# Patient Record
Sex: Male | Born: 1940 | Race: White | Hispanic: No | Marital: Single | Smoking: Former smoker
Health system: Southern US, Community
[De-identification: ages and names within clinical notes are randomized; demographics above are authoritative.]

## PROBLEM LIST (undated history)

## (undated) DIAGNOSIS — I10 Essential (primary) hypertension: Secondary | ICD-10-CM

## (undated) DIAGNOSIS — K469 Unspecified abdominal hernia without obstruction or gangrene: Secondary | ICD-10-CM

## (undated) DIAGNOSIS — D751 Secondary polycythemia: Principal | ICD-10-CM

## (undated) DIAGNOSIS — D45 Polycythemia vera: Secondary | ICD-10-CM

## (undated) DIAGNOSIS — R197 Diarrhea, unspecified: Secondary | ICD-10-CM

## (undated) DIAGNOSIS — R42 Dizziness and giddiness: Secondary | ICD-10-CM

## (undated) DIAGNOSIS — R413 Other amnesia: Secondary | ICD-10-CM

## (undated) DIAGNOSIS — C801 Malignant (primary) neoplasm, unspecified: Secondary | ICD-10-CM

## (undated) DIAGNOSIS — T7840XA Allergy, unspecified, initial encounter: Secondary | ICD-10-CM

## (undated) DIAGNOSIS — I498 Other specified cardiac arrhythmias: Secondary | ICD-10-CM

## (undated) DIAGNOSIS — E876 Hypokalemia: Secondary | ICD-10-CM

## (undated) DIAGNOSIS — L989 Disorder of the skin and subcutaneous tissue, unspecified: Secondary | ICD-10-CM

## (undated) DIAGNOSIS — H9190 Unspecified hearing loss, unspecified ear: Secondary | ICD-10-CM

## (undated) DIAGNOSIS — F17200 Nicotine dependence, unspecified, uncomplicated: Secondary | ICD-10-CM

## (undated) DIAGNOSIS — R1084 Generalized abdominal pain: Secondary | ICD-10-CM

## (undated) DIAGNOSIS — N289 Disorder of kidney and ureter, unspecified: Secondary | ICD-10-CM

## (undated) HISTORY — DX: Essential (primary) hypertension: I10

## (undated) HISTORY — DX: Secondary polycythemia: D75.1

## (undated) HISTORY — DX: Other disorders of iron metabolism: E83.19

## (undated) HISTORY — PX: CHOLECYSTECTOMY: SHX55

## (undated) HISTORY — DX: Polycythemia vera: D45

## (undated) HISTORY — DX: Hypokalemia: E87.6

## (undated) HISTORY — DX: Disorder of the skin and subcutaneous tissue, unspecified: L98.9

## (undated) HISTORY — PX: VENTRAL HERNIA REPAIR: SHX424

## (undated) HISTORY — DX: Diarrhea, unspecified: R19.7

## (undated) HISTORY — DX: Disorder of kidney and ureter, unspecified: N28.9

## (undated) HISTORY — DX: Unspecified abdominal hernia without obstruction or gangrene: K46.9

## (undated) HISTORY — DX: Generalized abdominal pain: R10.84

## (undated) HISTORY — DX: Other specified cardiac arrhythmias: I49.8

## (undated) HISTORY — DX: Unspecified hearing loss, unspecified ear: H91.90

## (undated) HISTORY — DX: Nicotine dependence, unspecified, uncomplicated: F17.200

## (undated) HISTORY — DX: Other amnesia: R41.3

## (undated) HISTORY — DX: Dizziness and giddiness: R42

## (undated) HISTORY — DX: Allergy, unspecified, initial encounter: T78.40XA

## (undated) HISTORY — PX: OTHER SURGICAL HISTORY: SHX169

---

## 2007-01-16 ENCOUNTER — Encounter: Admission: RE | Admit: 2007-01-16 | Discharge: 2007-01-16 | Payer: Self-pay | Admitting: General Surgery

## 2007-04-18 ENCOUNTER — Ambulatory Visit: Payer: Self-pay | Admitting: Internal Medicine

## 2007-05-03 ENCOUNTER — Ambulatory Visit: Payer: Self-pay | Admitting: Internal Medicine

## 2007-07-05 ENCOUNTER — Encounter: Payer: Self-pay | Admitting: Internal Medicine

## 2008-12-26 ENCOUNTER — Ambulatory Visit: Payer: Self-pay | Admitting: Oncology

## 2009-01-12 LAB — CBC WITH DIFFERENTIAL (CANCER CENTER ONLY)
BASO#: 0.1 10*3/uL (ref 0.0–0.2)
EOS%: 2.2 % (ref 0.0–7.0)
HCT: 49.2 % (ref 38.7–49.9)
LYMPH#: 1.2 10*3/uL (ref 0.9–3.3)
MCH: 31.3 pg (ref 28.0–33.4)
MCHC: 33 g/dL (ref 32.0–35.9)
MCV: 95 fL (ref 82–98)
MONO%: 5.7 % (ref 0.0–13.0)
NEUT%: 69.9 % (ref 40.0–80.0)

## 2009-01-12 LAB — CMP (CANCER CENTER ONLY)
AST: 30 U/L (ref 11–38)
Albumin: 3.3 g/dL (ref 3.3–5.5)
BUN, Bld: 9 mg/dL (ref 7–22)
CO2: 30 mEq/L (ref 18–33)
Calcium: 9.2 mg/dL (ref 8.0–10.3)
Chloride: 102 mEq/L (ref 98–108)
Creat: 0.8 mg/dl (ref 0.6–1.2)
Glucose, Bld: 102 mg/dL (ref 73–118)
Potassium: 4 mEq/L (ref 3.3–4.7)
Sodium: 141 mEq/L (ref 128–145)
Total Bilirubin: 0.6 mg/dl (ref 0.20–1.60)
Total Protein: 7.2 g/dL (ref 6.4–8.1)

## 2009-01-12 LAB — MORPHOLOGY - CHCC SATELLITE
PLT EST ~~LOC~~: ADEQUATE
Platelet Morphology: NORMAL
RBC Comments: NORMAL

## 2009-01-13 LAB — IRON AND TIBC
TIBC: 335 ug/dL (ref 215–435)
UIBC: 270 ug/dL

## 2009-01-16 ENCOUNTER — Ambulatory Visit (HOSPITAL_COMMUNITY): Admission: RE | Admit: 2009-01-16 | Discharge: 2009-01-16 | Payer: Self-pay | Admitting: Oncology

## 2009-02-03 ENCOUNTER — Ambulatory Visit: Payer: Self-pay | Admitting: Oncology

## 2009-02-05 LAB — CBC WITH DIFFERENTIAL (CANCER CENTER ONLY)
BASO#: 0.1 10*3/uL (ref 0.0–0.2)
BASO%: 1 % (ref 0.0–2.0)
EOS%: 2 % (ref 0.0–7.0)
LYMPH#: 1.1 10*3/uL (ref 0.9–3.3)
LYMPH%: 16.7 % (ref 14.0–48.0)
MCHC: 33.6 g/dL (ref 32.0–35.9)
MONO%: 6.9 % (ref 0.0–13.0)
NEUT#: 4.8 10*3/uL (ref 1.5–6.5)
NEUT%: 73.4 % (ref 40.0–80.0)
WBC: 6.5 10*3/uL (ref 4.0–10.0)

## 2009-08-10 ENCOUNTER — Ambulatory Visit: Payer: Self-pay | Admitting: Oncology

## 2009-08-19 LAB — CBC WITH DIFFERENTIAL (CANCER CENTER ONLY)
BASO#: 0.2 10*3/uL (ref 0.0–0.2)
HCT: 52.3 % — ABNORMAL HIGH (ref 38.7–49.9)
LYMPH#: 1.4 10*3/uL (ref 0.9–3.3)
LYMPH%: 21.9 % (ref 14.0–48.0)
MCH: 31.1 pg (ref 28.0–33.4)
MCHC: 33.1 g/dL (ref 32.0–35.9)
MCV: 94 fL (ref 82–98)
MONO#: 0.5 10*3/uL (ref 0.1–0.9)
NEUT#: 4.3 10*3/uL (ref 1.5–6.5)
RDW: 11.4 % (ref 10.5–14.6)
WBC: 6.6 10*3/uL (ref 4.0–10.0)

## 2009-09-22 ENCOUNTER — Ambulatory Visit: Payer: Self-pay | Admitting: Oncology

## 2009-09-24 LAB — CBC WITH DIFFERENTIAL/PLATELET
BASO%: 0.6 % (ref 0.0–2.0)
LYMPH%: 20.9 % (ref 14.0–49.0)
NEUT#: 4.7 10*3/uL (ref 1.5–6.5)
Platelets: 236 10*3/uL (ref 140–400)
RBC: 5.09 10*6/uL (ref 4.20–5.82)
WBC: 6.9 10*3/uL (ref 4.0–10.3)
lymph#: 1.5 10*3/uL (ref 0.9–3.3)

## 2009-10-23 ENCOUNTER — Ambulatory Visit: Payer: Self-pay | Admitting: Oncology

## 2009-10-27 LAB — CBC WITH DIFFERENTIAL (CANCER CENTER ONLY)
BASO%: 0.8 % (ref 0.0–2.0)
EOS%: 3.7 % (ref 0.0–7.0)
MCHC: 34.3 g/dL (ref 32.0–35.9)
MONO#: 0.4 10*3/uL (ref 0.1–0.9)
NEUT#: 3.7 10*3/uL (ref 1.5–6.5)
NEUT%: 66 % (ref 40.0–80.0)
Platelets: 305 10*3/uL (ref 145–400)
RBC: 5.06 10*6/uL (ref 4.20–5.70)
RDW: 12 % (ref 10.5–14.6)

## 2009-11-03 HISTORY — PX: US ECHOCARDIOGRAPHY: HXRAD669

## 2009-11-03 HISTORY — PX: NM MYOCAR PERF WALL MOTION: HXRAD629

## 2009-11-27 ENCOUNTER — Ambulatory Visit: Payer: Self-pay | Admitting: Oncology

## 2009-12-03 LAB — CBC WITH DIFFERENTIAL (CANCER CENTER ONLY)
BASO%: 0.6 % (ref 0.0–2.0)
HCT: 40.8 % (ref 38.7–49.9)
HGB: 13.8 g/dL (ref 13.0–17.1)
LYMPH#: 1.4 10*3/uL (ref 0.9–3.3)
MCHC: 33.8 g/dL (ref 32.0–35.9)
MONO#: 0.5 10*3/uL (ref 0.1–0.9)
NEUT#: 4.3 10*3/uL (ref 1.5–6.5)
Platelets: 319 10*3/uL (ref 145–400)
RBC: 4.57 10*6/uL (ref 4.20–5.70)

## 2009-12-03 LAB — IRON AND TIBC
Iron: 44 ug/dL (ref 42–165)
UIBC: 349 ug/dL

## 2010-06-03 ENCOUNTER — Encounter (HOSPITAL_BASED_OUTPATIENT_CLINIC_OR_DEPARTMENT_OTHER): Payer: Medicare Other | Admitting: Oncology

## 2010-06-03 ENCOUNTER — Other Ambulatory Visit: Payer: Self-pay | Admitting: Oncology

## 2010-06-03 DIAGNOSIS — D45 Polycythemia vera: Secondary | ICD-10-CM

## 2010-06-03 LAB — CBC WITH DIFFERENTIAL/PLATELET
Eosinophils Absolute: 0.1 10*3/uL (ref 0.0–0.5)
HCT: 41 % (ref 38.4–49.9)
HGB: 13.6 g/dL (ref 13.0–17.1)
MCH: 28.9 pg (ref 27.2–33.4)
MCV: 87.3 fL (ref 79.3–98.0)
Platelets: 286 10*3/uL (ref 140–400)
RBC: 4.7 10*6/uL (ref 4.20–5.82)
lymph#: 1.1 10*3/uL (ref 0.9–3.3)
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
## 2011-01-05 ENCOUNTER — Encounter: Payer: Self-pay | Admitting: Oncology

## 2011-01-05 ENCOUNTER — Other Ambulatory Visit: Payer: Self-pay | Admitting: Oncology

## 2011-01-05 DIAGNOSIS — D751 Secondary polycythemia: Secondary | ICD-10-CM

## 2011-01-05 HISTORY — DX: Secondary polycythemia: D75.1

## 2011-01-05 HISTORY — DX: Other disorders of iron metabolism: E83.19

## 2011-01-14 ENCOUNTER — Other Ambulatory Visit (HOSPITAL_BASED_OUTPATIENT_CLINIC_OR_DEPARTMENT_OTHER): Payer: Medicare Other | Admitting: Lab

## 2011-01-14 ENCOUNTER — Telehealth: Payer: Self-pay | Admitting: *Deleted

## 2011-01-14 ENCOUNTER — Ambulatory Visit (HOSPITAL_BASED_OUTPATIENT_CLINIC_OR_DEPARTMENT_OTHER): Payer: Medicare Other | Admitting: Oncology

## 2011-01-14 ENCOUNTER — Other Ambulatory Visit: Payer: Self-pay | Admitting: Oncology

## 2011-01-14 VITALS — BP 180/87 | HR 54 | Temp 97.9°F | Ht 67.7 in | Wt 211.8 lb

## 2011-01-14 DIAGNOSIS — F17200 Nicotine dependence, unspecified, uncomplicated: Secondary | ICD-10-CM

## 2011-01-14 DIAGNOSIS — D751 Secondary polycythemia: Secondary | ICD-10-CM

## 2011-01-14 DIAGNOSIS — D45 Polycythemia vera: Secondary | ICD-10-CM

## 2011-01-14 LAB — IRON AND TIBC
Iron: 94 ug/dL (ref 42–165)
UIBC: 312 ug/dL (ref 125–400)

## 2011-01-14 LAB — CBC WITH DIFFERENTIAL/PLATELET
BASO%: 0.3 % (ref 0.0–2.0)
Basophils Absolute: 0 10*3/uL (ref 0.0–0.1)
EOS%: 2 % (ref 0.0–7.0)
HGB: 14.8 g/dL (ref 13.0–17.1)
LYMPH%: 20.1 % (ref 14.0–49.0)
MCV: 89.5 fL (ref 79.3–98.0)
MONO%: 8.4 % (ref 0.0–14.0)
NEUT%: 69.2 % (ref 39.0–75.0)
Platelets: 262 10*3/uL (ref 140–400)

## 2011-01-14 LAB — FERRITIN: Ferritin: 13 ng/mL — ABNORMAL LOW (ref 22–322)

## 2011-01-14 NOTE — Progress Notes (Signed)
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  CURRENT THERAPY:  Phlebotomies as needed.  However, he has not had one in quite some time.  INTERVAL HISTORY:  The patient is seen in followup today.  Clinically, he seems to be doing well.  He has no fevers, chills, night sweats, headaches, shortness of breath, chest pains, or palpitations.  He has no myalgias or arthralgias.  Remainder of the 10-point review of systems is negative.  ALLERGIES:  No known drug allergies.  CURRENT MEDICATIONS:  Reviewed and updated.  PHYSICAL EXAMINATION:  General:  The patient is awake, alert, in no acute distress, appears well.  Vital Signs:  Temperature is 97.9, pulse 54, respirations 20, blood pressure 180/87, and weight is 211 pounds. HEENT Exam:  EOMI, PERRLA.  Sclerae are anicteric, no conjunctival pallor.  Oral mucosa is moist.  Neck:  Supple.  Lymphatic:  No palpable cervical, supraclavicular, or axillary adenopathy.  Lungs:  Clear bilaterally but distant breath sounds.  Cardiovascular:  Regular rate and rhythm.  Abdomen:  Soft, nontender.  Extremities:  No edema.  Neuro: Patient is alert, oriented, otherwise nonfocal.  The patient has very poor oral dental hygiene.  SOCIAL HISTORY:  The patient continues to smoke, unfortunately.  LABORATORY DATA:  WBC 5.9, hemoglobin 14.8, hematocrit of 44.1, platelets are 262,000.  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 Overall, he seems to be doing well and is pretty stable.  He unfortunately still does continue to use tobacco especially chewing tobacco and a pipe.  He therefore maintains very poor oral dental hygiene which is concerning.  At this time, we did get iron studies on him.  I do not think that he needs a phlebotomy and we therefore will see him in a year's basis.    ______________________________ Drue Second, M.D. KK/MEDQ  D:  01/14/2011  T:  01/14/2011  Job:   [id number]

## 2011-01-14 NOTE — Telephone Encounter (Signed)
GAVE PATIENT APPOINTMENT FOR 01-2012 

## 2011-01-14 NOTE — Progress Notes (Signed)
Note dictated

## 2011-01-17 ENCOUNTER — Encounter: Payer: Self-pay | Admitting: *Deleted

## 2012-01-16 ENCOUNTER — Ambulatory Visit: Payer: Medicare Other | Admitting: Oncology

## 2012-01-16 ENCOUNTER — Other Ambulatory Visit: Payer: Medicare Other | Admitting: Lab

## 2012-02-20 ENCOUNTER — Ambulatory Visit: Payer: Medicare Other | Admitting: Oncology

## 2012-02-20 ENCOUNTER — Other Ambulatory Visit: Payer: Medicare Other | Admitting: Lab

## 2012-04-04 ENCOUNTER — Other Ambulatory Visit: Payer: Self-pay | Admitting: Emergency Medicine

## 2012-04-04 ENCOUNTER — Other Ambulatory Visit: Payer: Medicare Other | Admitting: Lab

## 2012-04-04 ENCOUNTER — Ambulatory Visit: Payer: Medicare Other | Admitting: Oncology

## 2012-04-04 DIAGNOSIS — D751 Secondary polycythemia: Secondary | ICD-10-CM

## 2012-04-13 ENCOUNTER — Telehealth: Payer: Self-pay | Admitting: Oncology

## 2012-04-13 NOTE — Telephone Encounter (Signed)
opt called to r/s appt.Marland KitchenMarland KitchenMarland KitchenDone

## 2012-05-09 ENCOUNTER — Encounter: Payer: Self-pay | Admitting: Internal Medicine

## 2012-05-30 ENCOUNTER — Encounter: Payer: Self-pay | Admitting: Internal Medicine

## 2012-06-06 ENCOUNTER — Telehealth: Payer: Self-pay | Admitting: *Deleted

## 2012-06-06 NOTE — Telephone Encounter (Signed)
Pt called in to discuss an appt

## 2012-06-06 NOTE — Telephone Encounter (Signed)
sw pt gv appt d/t for 09/21/12.

## 2012-06-22 ENCOUNTER — Other Ambulatory Visit: Payer: Medicare Other | Admitting: Lab

## 2012-06-22 ENCOUNTER — Ambulatory Visit: Payer: Medicare Other | Admitting: Oncology

## 2012-06-28 ENCOUNTER — Encounter: Payer: Self-pay | Admitting: Internal Medicine

## 2012-06-28 ENCOUNTER — Telehealth: Payer: Self-pay | Admitting: *Deleted

## 2012-06-28 ENCOUNTER — Ambulatory Visit (AMBULATORY_SURGERY_CENTER): Payer: Medicare Other | Admitting: *Deleted

## 2012-06-28 VITALS — Ht 69.0 in | Wt 207.6 lb

## 2012-06-28 DIAGNOSIS — Z800 Family history of malignant neoplasm of digestive organs: Secondary | ICD-10-CM

## 2012-06-28 DIAGNOSIS — Z1211 Encounter for screening for malignant neoplasm of colon: Secondary | ICD-10-CM

## 2012-06-28 MED ORDER — MOVIPREP 100 G PO SOLR: ORAL | Status: DC

## 2012-06-28 NOTE — Telephone Encounter (Signed)
Dr. Juanda Chance: pt is scheduled for recall colon 5/7. Last colonoscopy 2009 pt had fair prep using MoviPrep; adequate exam.  Pt says he has problems with constipation and has to take Dulcolax frequently.  I have given him instructions for 2 day prep using Miralax and Dulcolax 2 days beore; then standard MoviPrep day before and morning of procedure.  Please let me know if I need to make any changes to these instructions.  Thanks, Olegario Messier in [place].

## 2012-06-28 NOTE — Telephone Encounter (Signed)
I agree with 2 day prep as ordered. DB

## 2012-07-11 ENCOUNTER — Ambulatory Visit (AMBULATORY_SURGERY_CENTER): Payer: Medicare Other | Admitting: Internal Medicine

## 2012-07-11 ENCOUNTER — Encounter: Payer: Self-pay | Admitting: Internal Medicine

## 2012-07-11 VITALS — BP 142/88 | HR 49 | Temp 97.5°F | Resp 25 | Ht 69.0 in | Wt 207.0 lb

## 2012-07-11 DIAGNOSIS — Z800 Family history of malignant neoplasm of digestive organs: Secondary | ICD-10-CM

## 2012-07-11 DIAGNOSIS — Z1211 Encounter for screening for malignant neoplasm of colon: Secondary | ICD-10-CM

## 2012-07-11 MED ORDER — SODIUM CHLORIDE 0.9 % IV SOLN: 500.0000 mL | INTRAVENOUS | Status: DC

## 2012-07-11 NOTE — Patient Instructions (Addendum)
YOU HAD AN ENDOSCOPIC PROCEDURE TODAY AT [facility]: Refer to the procedure report that was given to you for any specific questions about what was found during the examination.  If the procedure report does not answer your questions, please call your gastroenterologist to clarify.  If you requested that your care partner not be given the details of your procedure findings, then the procedure report has been included in a sealed envelope for you to review at your convenience later.  YOU SHOULD EXPECT: Some feelings of bloating in the abdomen. Passage of more gas than usual.  Walking can help get rid of the air that was put into your GI tract during the procedure and reduce the bloating. If you had a lower endoscopy (such as a colonoscopy or flexible sigmoidoscopy) you may notice spotting of blood in your stool or on the toilet paper. If you underwent a bowel prep for your procedure, then you may not have a normal bowel movement for a few days.  DIET: Your first meal following the procedure should be a light meal and then it is ok to progress to your normal diet.  A half-sandwich or bowl of soup is an example of a good first meal.  Heavy or fried foods are harder to digest and may make you feel nauseous or bloated.  Likewise meals heavy in dairy and vegetables can cause extra gas to form and this can also increase the bloating.  Drink plenty of fluids but you should avoid alcoholic beverages for 24 hours.  ACTIVITY: Your care partner should take you home directly after the procedure.  You should plan to take it easy, moving slowly for the rest of the day.  You can resume normal activity the day after the procedure however you should NOT DRIVE or use heavy machinery for 24 hours (because of the sedation medicines used during the test).    SYMPTOMS TO REPORT IMMEDIATELY: A gastroenterologist can be reached at any hour.  During normal business hours, 8:30 AM to 5:00 PM Monday through Friday,  call [phone].  After hours and on weekends, please call the GI answering service at [phone] who will take a message and have the physician on call contact you.   Following lower endoscopy (colonoscopy or flexible sigmoidoscopy):  Excessive amounts of blood in the stool  Significant tenderness or worsening of abdominal pains  Swelling of the abdomen that is new, acute  Fever of 100F or higher     FOLLOW UP: If any biopsies were taken you will be contacted by phone or by letter within the next 1-3 weeks.  Call your gastroenterologist if you have not heard about the biopsies in 3 weeks.  Our staff will call the home number listed on your records the next business day following your procedure to check on you and address any questions or concerns that you may have at that time regarding the information given to you following your procedure. This is a courtesy call and so if there is no answer at the home number and we have not heard from you through the emergency physician on call, we will assume that you have returned to your regular daily activities without incident.  SIGNATURES/CONFIDENTIALITY: You and/or your care partner have signed paperwork which will be entered into your electronic medical record.  These signatures attest to the fact that that the information above on your After Visit Summary has been reviewed and is understood.  Full responsibility of the  confidentiality of this discharge information lies with you and/or your care-partner.  Hemorrhoid information, high fiber diet information given.  Next colonoscopy 5 years.

## 2012-07-11 NOTE — Progress Notes (Signed)
Patient did not experience any of the following events: a burn prior to discharge; a fall within the facility; wrong site/side/patient/procedure/implant event; or a hospital transfer or hospital admission upon discharge from the facility. (G8907) Patient did not have preoperative order for IV antibiotic SSI prophylaxis. (G8918)  

## 2012-07-11 NOTE — Op Note (Signed)
[facility] [address] [place], [address]   COLONOSCOPY PROCEDURE REPORT  PATIENT: Steven Krause, Steven Krause  MR#: [id number] BIRTHDATE: 1940-04-08 , 71  yrs. old GENDER: Male ENDOSCOPIST: Hart Carwin, MD REFERRED BY:  Elias Else, M.D. PROCEDURE DATE:  07/11/2012 PROCEDURE:   Colonoscopy, screening ASA CLASS:   Class II INDICATIONS:Patient's immediate family history of colon cancer and mother with colon cancer, last colon 06/2007 was normal. MEDICATIONS: MAC sedation, administered by CRNA and propofol (Diprivan) 200mg  IV  DESCRIPTION OF PROCEDURE:   After the risks and benefits and of the procedure were explained, informed consent was obtained.  A digital rectal exam revealed no abnormalities of the rectum.    The LB [id number]  endoscope was introduced through the anus and advanced to the cecum, which was identified by both the appendix and ileocecal valve .  The quality of the prep was good, using MoviPrep .  The instrument was then slowly withdrawn as the colon was fully examined.     COLON FINDINGS: Small internal hemorrhoids were found. Retroflexed views revealed no abnormalities.     The scope was then withdrawn from the patient and the procedure completed.  COMPLICATIONS: There were no complications. ENDOSCOPIC IMPRESSION: Small internal hemorrhoids  RECOMMENDATIONS: High fiber diet   REPEAT EXAM: In 5 year(s)  for Colonoscopy.  cc:  _______________________________ eSignedHart Carwin, MD 07/11/2012 12:47 PM

## 2012-07-12 ENCOUNTER — Telehealth: Payer: Self-pay

## 2012-07-12 NOTE — Telephone Encounter (Signed)
  Follow up Call-  Call back number 07/11/2012  Post procedure Call Back phone  # [phone]  Permission to leave phone message Yes     Patient questions:  Do you have a fever, pain , or abdominal swelling? no Pain Score  0 *  Have you tolerated food without any problems? yes  Have you been able to return to your normal activities? yes  Do you have any questions about your discharge instructions: Diet   no Medications  no Follow up visit  no  Do you have questions or concerns about your Care? no  Actions: * If pain score is 4 or above: No action needed, pain <4.   No problems per the pt. Maw

## 2012-07-23 ENCOUNTER — Other Ambulatory Visit: Payer: Self-pay | Admitting: *Deleted

## 2012-07-23 MED ORDER — HYDROCODONE-ACETAMINOPHEN 5-325 MG PO TABS: ORAL_TABLET | ORAL | Status: DC

## 2012-08-24 ENCOUNTER — Encounter: Payer: Self-pay | Admitting: *Deleted

## 2012-08-27 ENCOUNTER — Ambulatory Visit (INDEPENDENT_AMBULATORY_CARE_PROVIDER_SITE_OTHER): Payer: Medicare Other | Admitting: Internal Medicine

## 2012-08-27 ENCOUNTER — Encounter: Payer: Self-pay | Admitting: Internal Medicine

## 2012-08-27 VITALS — BP 126/74 | HR 55 | Temp 97.7°F | Resp 20 | Ht 69.0 in | Wt 207.0 lb

## 2012-08-27 DIAGNOSIS — E876 Hypokalemia: Secondary | ICD-10-CM

## 2012-08-27 DIAGNOSIS — I10 Essential (primary) hypertension: Secondary | ICD-10-CM

## 2012-08-27 DIAGNOSIS — F17200 Nicotine dependence, unspecified, uncomplicated: Secondary | ICD-10-CM

## 2012-08-27 DIAGNOSIS — R3911 Hesitancy of micturition: Secondary | ICD-10-CM

## 2012-08-27 DIAGNOSIS — E669 Obesity, unspecified: Secondary | ICD-10-CM

## 2012-08-27 DIAGNOSIS — R413 Other amnesia: Secondary | ICD-10-CM

## 2012-08-27 NOTE — Progress Notes (Signed)
Patient ID: Steven Krause, male   DOB: May 27, 1940, 72 y.o.   MRN: [id number] Location:  [facility] [place] [facility]  Code Status: POA not here to discuss this today   No Known Allergies  Chief Complaint  Patient presents with  . Annual Exam    no new problems    HPI: Patient is a 72 y.o. male seen in the office today for annual exam to review chronic medical conditions and for physical.   Nothing new.  See ROS.  Review of Systems:  Review of Systems  Constitutional: Negative for fever, chills and weight loss.  Eyes: Negative for blurred vision.  Respiratory: Negative for shortness of breath.   Cardiovascular: Negative for chest pain.  Gastrointestinal: Negative for heartburn, constipation, blood in stool and melena.  Genitourinary:       Sometimes has some difficulty starting stream, then "goes right loose";  Gets up each hour to urinate  Musculoskeletal: Negative for joint pain and falls.  Skin: Negative for rash.  Neurological: Positive for tingling. Negative for headaches.       In feet sometimes  Endo/Heme/Allergies: Does not bruise/bleed easily.  Psychiatric/Behavioral: Positive for memory loss. Negative for depression. The patient has insomnia.        Likes to tell jokes   Past Medical History  Diagnosis Date  . Acquired polycythemia 01/05/2011  . Iron excess 01/05/2011  . Hypertension   . Allergy   . Memory loss   . Unspecified hearing loss   . Hypopotassemia   . Other specified cardiac dysrhythmias(427.89)   . Polycythemia, secondary   . 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 Unspecified disorder of kidney and ureter   . Tobacco use disorder   . Hernia of unspecified site of abdominal cavity without mention of obstruction or gangrene   . Unspecified disorder of skin and subcutaneous tissue   . Dizziness and giddiness   . Diarrhea   . Abdominal pain, generalized     Past Surgical History  Procedure Laterality Date  . Cholecystectomy     . Ventral hernia repair    . Inflamed colon      Social History:   reports that he has been smoking Pipe.  He has never used smokeless tobacco. He reports that he does not drink alcohol or use illicit drugs.  Family History  Problem Relation Age of Onset  . Colon polyps Mother 40  . Colon cancer Mother   . Stomach cancer Neg Hx   . Rectal cancer Neg Hx   . Heart disease Father   . Diabetes Sister   . Cancer Brother   . Diabetes Sister   . Diabetes Sister   . Heart disease Sister     Medications: Patient's Medications  New Prescriptions   No medications on file  Previous Medications   AMLODIPINE (NORVASC) 10 MG TABLET    Take 10 mg by mouth daily.    ASPIRIN 81 MG TABLET    Take 81 mg by mouth daily.     BISACODYL (DULCOLAX PO)    Take by mouth as needed.   FUROSEMIDE (LASIX) 40 MG TABLET    Take 40 mg by mouth 2 (two) times daily.     HYDROCODONE-ACETAMINOPHEN (NORCO/VICODIN) 5-325 MG PER TABLET    Take one tablet once a day for pain   PINDOLOL (VISKEN) 5 MG TABLET    Take 5 mg by mouth 2 (two) times daily.     POTASSIUM CHLORIDE SA (K-DUR,KLOR-CON) 20  MEQ TABLET    Take 20 mEq by mouth daily.     TAMSULOSIN HCL (FLOMAX) 0.4 MG CAPS    Take by mouth.    Modified Medications   No medications on file  Discontinued Medications   No medications on file   Physical Exam: Filed Vitals:   08/27/12 1449  BP: 126/74  Pulse: 55  Temp: 97.7 F (36.5 C)  TempSrc: Oral  Resp: 20  Height: 5\' 9"  (1.753 m)  Weight: 207 lb (93.895 kg)  SpO2: 96%  Physical Exam  Constitutional: He is oriented to person, place, and time. He appears well-developed and well-nourished. No distress.  Overweight white male, poor hygiene  HENT:  Head: Normocephalic and atraumatic.  Right Ear: External ear normal.  Left Ear: External ear normal.  Nose: Nose normal.  Mouth/Throat: Oropharynx is clear and moist. No oropharyngeal exudate.  Very poor dentition with multiple broken teeth and caries and  halitosis  Eyes: Conjunctivae and EOM are normal. Pupils are equal, round, and reactive to light. No scleral icterus.  Neck: Normal range of motion. Neck supple. No JVD present. No tracheal deviation present. No thyromegaly present.  Cardiovascular: Normal rate, regular rhythm, normal heart sounds and intact distal pulses.   Pulmonary/Chest: Effort normal and breath sounds normal. No respiratory distress. He has no wheezes. He has no rales.  Abdominal: Soft. Bowel sounds are normal. He exhibits no distension and no mass. There is no tenderness. There is no rebound and no guarding.  Musculoskeletal: Normal range of motion. He exhibits no edema and no tenderness.  Lymphadenopathy:    He has cervical adenopathy.  Neurological: He is alert and oriented to person, place, and time. No cranial nerve deficit.  Skin: Skin is warm and dry.   Labs reviewed:  Past Procedures: Colonoscopy--5/14--Dr. Brodie--small internal hemorrhoids--repeat in 5 years  Assessment/Plan Hypertension BP at goal with norvasc, pindolol, lasix.  He needs to lose weight, diet and exercise, but he has no plans to do this.    Memory loss Recheck MMSE next visit.  He is dependent on a friend of his who normally comes to visits, but had knee surgery.    Tobacco use disorder Persists--pipe smoking.  No plans to quit.  Dentition is awful, and he says he is just waiting for his teeth to rot out. Risks of this like infection, pain, swelling reviewed with him, but he is adamant.    Hypopotassemia Recheck K level.  Is on lasix.  Obesity, unspecified Encouraged diet and exercise.  Is high risk for coronary event especially with abdominal obesity and tobacco abuse.  Check hba1c  Urinary hesitancy Will check PSA.  He is not bothered by this symptom.  Has not seen urology.   Labs/tests ordered:  Cbc, cmp, hba1c, psa, home hemoccult Next appt:  6 mos

## 2012-08-28 LAB — HEMOGLOBIN A1C
Est. average glucose Bld gHb Est-mCnc: 123 mg/dL
Hgb A1c MFr Bld: 5.9 % — ABNORMAL HIGH (ref 4.8–5.6)

## 2012-08-28 LAB — COMPREHENSIVE METABOLIC PANEL
ALT: 37 IU/L (ref 0–44)
AST: 40 IU/L (ref 0–40)
Albumin/Globulin Ratio: 1.1 (ref 1.1–2.5)
Albumin: 4.1 g/dL (ref 3.5–4.8)
Alkaline Phosphatase: 85 IU/L (ref 39–117)
BUN/Creatinine Ratio: 10 (ref 10–22)
BUN: 9 mg/dL (ref 8–27)
CO2: 26 mmol/L (ref 18–29)
Calcium: 9.3 mg/dL (ref 8.6–10.2)
Chloride: 99 mmol/L (ref 97–108)
Creatinine, Ser: 0.94 mg/dL (ref 0.76–1.27)
GFR calc Af Amer: 94 mL/min/{1.73_m2} (ref >59)
GFR calc non Af Amer: 81 mL/min/{1.73_m2} (ref >59)
Globulin, Total: 3.7 g/dL (ref 1.5–4.5)
Glucose: 90 mg/dL (ref 65–99)
Potassium: 4.1 mmol/L (ref 3.5–5.2)
Sodium: 141 mmol/L (ref 134–144)
Total Bilirubin: 0.4 mg/dL (ref 0.0–1.2)
Total Protein: 7.8 g/dL (ref 6.0–8.5)

## 2012-08-28 LAB — CBC WITH DIFFERENTIAL/PLATELET
Basophils Absolute: 0 10*3/uL (ref 0.0–0.2)
Basos: 0 % (ref 0–3)
Eos: 2 % (ref 0–5)
Eosinophils Absolute: 0.1 10*3/uL (ref 0.0–0.4)
HCT: 46.1 % (ref 37.5–51.0)
Hemoglobin: 16 g/dL (ref 12.6–17.7)
Immature Grans (Abs): 0 10*3/uL (ref 0.0–0.1)
Immature Granulocytes: 0 % (ref 0–2)
Lymphocytes Absolute: 1.3 10*3/uL (ref 0.7–3.1)
Lymphs: 17 % (ref 14–46)
MCH: 31.1 pg (ref 26.6–33.0)
MCHC: 34.7 g/dL (ref 31.5–35.7)
MCV: 90 fL (ref 79–97)
Monocytes Absolute: 0.6 10*3/uL (ref 0.1–0.9)
Monocytes: 8 % (ref 4–12)
Neutrophils Absolute: 5.6 10*3/uL (ref 1.4–7.0)
Neutrophils Relative %: 73 % (ref 40–74)
RBC: 5.15 x10E6/uL (ref 4.14–5.80)
RDW: 13.7 % (ref 12.3–15.4)
WBC: 7.7 10*3/uL (ref 3.4–10.8)

## 2012-08-28 LAB — PSA: PSA: 2.8 ng/mL (ref 0.0–4.0)

## 2012-08-30 ENCOUNTER — Encounter: Payer: Self-pay | Admitting: *Deleted

## 2012-09-02 ENCOUNTER — Encounter: Payer: Self-pay | Admitting: Internal Medicine

## 2012-09-02 NOTE — Assessment & Plan Note (Signed)
BP at goal with norvasc, pindolol, lasix.  He needs to lose weight, diet and exercise, but he has no plans to do this.

## 2012-09-02 NOTE — Assessment & Plan Note (Signed)
Recheck K level.  Is on lasix.

## 2012-09-02 NOTE — Assessment & Plan Note (Signed)
Persists--pipe smoking.  No plans to quit.  Dentition is awful, and he says he is just waiting for his teeth to rot out. Risks of this like infection, pain, swelling reviewed with him, but he is adamant.

## 2012-09-02 NOTE — Assessment & Plan Note (Signed)
Recheck MMSE next visit.  He is dependent on a friend of his who normally comes to visits, but had knee surgery.

## 2012-09-02 NOTE — Assessment & Plan Note (Signed)
Will check PSA.  He is not bothered by this symptom.  Has not seen urology.

## 2012-09-02 NOTE — Assessment & Plan Note (Addendum)
Encouraged diet and exercise.  Is high risk for coronary event especially with abdominal obesity and tobacco abuse.  Check hba1c

## 2012-09-12 ENCOUNTER — Other Ambulatory Visit: Payer: Self-pay | Admitting: Internal Medicine

## 2012-09-19 ENCOUNTER — Other Ambulatory Visit: Payer: Medicare Other | Admitting: Lab

## 2012-09-19 ENCOUNTER — Ambulatory Visit: Payer: Medicare Other | Admitting: Oncology

## 2012-09-21 ENCOUNTER — Telehealth: Payer: Self-pay | Admitting: *Deleted

## 2012-09-21 ENCOUNTER — Ambulatory Visit (HOSPITAL_BASED_OUTPATIENT_CLINIC_OR_DEPARTMENT_OTHER): Payer: Medicaid Other | Admitting: Oncology

## 2012-09-21 ENCOUNTER — Other Ambulatory Visit (HOSPITAL_BASED_OUTPATIENT_CLINIC_OR_DEPARTMENT_OTHER): Payer: Medicaid Other | Admitting: Lab

## 2012-09-21 ENCOUNTER — Encounter: Payer: Self-pay | Admitting: Oncology

## 2012-09-21 VITALS — BP 121/70 | HR 49 | Temp 97.7°F | Resp 20 | Ht 69.0 in | Wt 204.7 lb

## 2012-09-21 DIAGNOSIS — D751 Secondary polycythemia: Secondary | ICD-10-CM

## 2012-09-21 LAB — COMPREHENSIVE METABOLIC PANEL (CC13)
ALT: 38 U/L (ref 0–55)
Albumin: 3.4 g/dL — ABNORMAL LOW (ref 3.5–5.0)
CO2: 28 mEq/L (ref 22–29)
Calcium: 9.3 mg/dL (ref 8.4–10.4)
Chloride: 103 mEq/L (ref 98–109)
Sodium: 140 mEq/L (ref 136–145)
Total Protein: 7.6 g/dL (ref 6.4–8.3)

## 2012-09-21 LAB — CBC WITH DIFFERENTIAL/PLATELET
Eosinophils Absolute: 0.1 10*3/uL (ref 0.0–0.5)
HCT: 45.3 % (ref 38.4–49.9)
LYMPH%: 18.3 % (ref 14.0–49.0)
MCV: 91.4 fL (ref 79.3–98.0)
MONO%: 9.5 % (ref 0.0–14.0)
NEUT#: 4.9 10*3/uL (ref 1.5–6.5)
NEUT%: 69.6 % (ref 39.0–75.0)
Platelets: 275 10*3/uL (ref 140–400)
RBC: 4.95 10*6/uL (ref 4.20–5.82)

## 2012-09-21 LAB — IRON AND TIBC CHCC: %SAT: 33 % (ref 20–55)

## 2012-09-21 NOTE — Telephone Encounter (Signed)
appts made and printed...td 

## 2012-09-21 NOTE — Patient Instructions (Addendum)
Doing well blood counts are normal  Lab Results  Component Value Date   WBC 7.0 09/21/2012   HGB 15.4 09/21/2012   HCT 45.3 09/21/2012   MCV 91.4 09/21/2012   PLT 275 09/21/2012

## 2012-09-27 ENCOUNTER — Encounter: Payer: Self-pay | Admitting: Oncology

## 2012-10-14 NOTE — Progress Notes (Signed)
OFFICE PROGRESS NOTE  CC  REED, [name], DO [address] [place] [address]  DIAGNOSIS: 72 year old gentleman with acquired polycythemia  PRIOR THERAPY:  #1 patient has received phlebotomies in the past on an as-needed basis to try to keep his hematocrit below 45  CURRENT THERAPY:observation  INTERVAL HISTORY: Steven Krause 72 y.o. male returns for followup visit today. He is here alone. He denies any fevers chills night sweats peripheral paresthesias no nausea vomiting no myalgias and arthralgias. No recent hospitalizations. Note bleeding problems. Remainder of the 10 point review of systems is negative.  MEDICAL HISTORY: Past Medical History  Diagnosis Date  . Acquired polycythemia 01/05/2011  . Iron excess 01/05/2011  . Hypertension   . Allergy   . Memory loss   . Unspecified hearing loss   . Hypopotassemia   . Other specified cardiac dysrhythmias(427.89)   . Polycythemia, secondary   . 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 Unspecified disorder of kidney and ureter   . Tobacco use disorder   . Hernia of unspecified site of abdominal cavity without mention of obstruction or gangrene   . Unspecified disorder of skin and subcutaneous tissue   . Dizziness and giddiness   . Diarrhea   . Abdominal pain, generalized     ALLERGIES:  has No Known Allergies.  MEDICATIONS:  Current Outpatient Prescriptions  Medication Sig Dispense Refill  . amLODipine (NORVASC) 10 MG tablet Take 10 mg by mouth daily.       Marland Kitchen aspirin 81 MG tablet Take 81 mg by mouth daily.        . Bisacodyl (DULCOLAX PO) Take by mouth as needed.      . furosemide (LASIX) 40 MG tablet Take 40 mg by mouth 2 (two) times daily.        Marland Kitchen HYDROcodone-acetaminophen (NORCO/VICODIN) 5-325 MG per tablet Take one tablet once a day for pain  60 tablet  5  . pindolol (VISKEN) 5 MG tablet Take 5 mg by mouth 2 (two) times daily.        . potassium chloride SA (K-DUR,KLOR-CON) 20 MEQ tablet Take 20 mEq by mouth daily.         . tamsulosin (FLOMAX) 0.4 MG CAPS TAKE ONE (1) CAPSULE BY MOUTH ONCE DAILY.  30 capsule  5   No current facility-administered medications for this visit.    SURGICAL HISTORY:  Past Surgical History  Procedure Laterality Date  . Cholecystectomy    . Ventral hernia repair    . Inflamed colon      REVIEW OF SYSTEMS:  Pertinent items are noted in HPI.   HEALTH MAINTENANCE:   PHYSICAL EXAMINATION: Blood pressure 121/70, pulse 49, temperature 97.7 F (36.5 C), temperature source Oral, resp. rate 20, height 5\' 9"  (1.753 m), weight 204 lb 11.2 oz (92.851 kg). Body mass index is 30.22 kg/(m^2). ECOG PERFORMANCE STATUS: 1 - Symptomatic but completely ambulatory   General appearance: alert, cooperative and appears older than stated age Resp: clear to auscultation bilaterally Cardio: regular rate and rhythm GI: soft, non-tender; bowel sounds normal; no masses,  no organomegaly Extremities: no edema, redness or tenderness in the calves or thighs Neurologic: Grossly normal   LABORATORY DATA: Lab Results  Component Value Date   WBC 7.0 09/21/2012   HGB 15.4 09/21/2012   HCT 45.3 09/21/2012   MCV 91.4 09/21/2012   PLT 275 09/21/2012      Chemistry      Component Value Date/Time   NA 140 09/21/2012 1016  NA 141 08/27/2012 1542   NA 141 01/12/2009 1259   K 3.6 09/21/2012 1016   K 4.1 08/27/2012 1542   K 4.0 01/12/2009 1259   CL 99 08/27/2012 1542   CL 102 01/12/2009 1259   CO2 28 09/21/2012 1016   CO2 26 08/27/2012 1542   CO2 30 01/12/2009 1259   BUN 10.1 09/21/2012 1016   BUN 9 08/27/2012 1542   BUN 9 01/12/2009 1259   CREATININE 0.9 09/21/2012 1016   CREATININE 0.94 08/27/2012 1542   CREATININE 0.8 01/12/2009 1259      Component Value Date/Time   CALCIUM 9.3 09/21/2012 1016   CALCIUM 9.3 08/27/2012 1542   CALCIUM 9.2 01/12/2009 1259   ALKPHOS 74 09/21/2012 1016   ALKPHOS 85 08/27/2012 1542   ALKPHOS 66 01/12/2009 1259   AST 37* 09/21/2012 1016   AST 40 08/27/2012 1542   AST 30 01/12/2009  1259   ALT 38 09/21/2012 1016   ALT 37 08/27/2012 1542   ALT 31 01/12/2009 1259   BILITOT 0.46 09/21/2012 1016   BILITOT 0.4 08/27/2012 1542   BILITOT 0.60 01/12/2009 1259       RADIOGRAPHIC STUDIES:  No results found.  ASSESSMENT: 72 year old man with far polycythemia has been receiving phlebotomies as needed. However he has not had been quite some time. His blood counts seem to be pretty stable. We will continue to observe him.   PLAN: patient will be seen back in one years time or sooner if need arises.   All questions were answered. The patient knows to call the clinic with any problems, questions or concerns. We can certainly see the patient much sooner if necessary.  I spent 15 minutes counseling the patient face to face. The total time spent in the appointment was 20 minutes.    Drue Second, MD Medical/Oncology [facility] [phone] (beeper) [phone] (Office)

## 2012-10-17 ENCOUNTER — Other Ambulatory Visit: Payer: Self-pay | Admitting: Geriatric Medicine

## 2012-10-17 MED ORDER — POTASSIUM CHLORIDE CRYS ER 20 MEQ PO TBCR: 20.0000 meq | EXTENDED_RELEASE_TABLET | Freq: Every day | ORAL | Status: DC

## 2012-11-13 ENCOUNTER — Other Ambulatory Visit: Payer: Self-pay | Admitting: Internal Medicine

## 2012-11-21 ENCOUNTER — Other Ambulatory Visit: Payer: Self-pay | Admitting: *Deleted

## 2012-11-21 ENCOUNTER — Telehealth: Payer: Self-pay | Admitting: *Deleted

## 2012-11-21 NOTE — Telephone Encounter (Signed)
Patient girlfriend, Clydie Braun, called and stated that she was told to give [place] Potassium 20 meq three times daily but the pharmacy is saying only once daily. What is the correct dosage? Please Advise.

## 2012-11-22 ENCOUNTER — Other Ambulatory Visit: Payer: Self-pay | Admitting: *Deleted

## 2012-11-22 MED ORDER — POTASSIUM CHLORIDE CRYS ER 20 MEQ PO TBCR: EXTENDED_RELEASE_TABLET | ORAL | Status: DC

## 2012-11-22 NOTE — Telephone Encounter (Signed)
Clydie Braun Notified and updated chart.

## 2012-11-22 NOTE — Telephone Encounter (Signed)
It appears he was on KCl 2 tablets daily.  I don't see anything about tid.

## 2012-12-13 ENCOUNTER — Ambulatory Visit (INDEPENDENT_AMBULATORY_CARE_PROVIDER_SITE_OTHER): Payer: Medicare Other

## 2012-12-13 DIAGNOSIS — Z23 Encounter for immunization: Secondary | ICD-10-CM

## 2013-01-20 ENCOUNTER — Other Ambulatory Visit: Payer: Self-pay | Admitting: Internal Medicine

## 2013-02-11 ENCOUNTER — Ambulatory Visit (INDEPENDENT_AMBULATORY_CARE_PROVIDER_SITE_OTHER): Payer: Medicare Other | Admitting: Internal Medicine

## 2013-02-11 ENCOUNTER — Encounter: Payer: Self-pay | Admitting: Internal Medicine

## 2013-02-11 ENCOUNTER — Other Ambulatory Visit: Payer: Self-pay | Admitting: Internal Medicine

## 2013-02-11 VITALS — BP 138/80 | HR 76 | Temp 97.4°F | Resp 14 | Wt 210.8 lb

## 2013-02-11 DIAGNOSIS — I10 Essential (primary) hypertension: Secondary | ICD-10-CM

## 2013-02-11 DIAGNOSIS — F17200 Nicotine dependence, unspecified, uncomplicated: Secondary | ICD-10-CM

## 2013-02-11 DIAGNOSIS — I509 Heart failure, unspecified: Secondary | ICD-10-CM

## 2013-02-11 DIAGNOSIS — R609 Edema, unspecified: Secondary | ICD-10-CM

## 2013-02-11 DIAGNOSIS — R739 Hyperglycemia, unspecified: Secondary | ICD-10-CM

## 2013-02-11 DIAGNOSIS — E669 Obesity, unspecified: Secondary | ICD-10-CM

## 2013-02-11 DIAGNOSIS — R600 Localized edema: Secondary | ICD-10-CM

## 2013-02-11 DIAGNOSIS — R7309 Other abnormal glucose: Secondary | ICD-10-CM

## 2013-02-11 MED ORDER — SPIRONOLACTONE 25 MG PO TABS: 25.0000 mg | ORAL_TABLET | Freq: Every day | ORAL | Status: DC

## 2013-02-11 NOTE — Patient Instructions (Signed)
Start aldactone 25mg  daily Stop potassium supplement  Cont lasix 40mg  twice daily See Dr. Salena Saner ASAP due to 2nd degree AV block which seems to be causing some increased edema and heart failure.   Also please get xray taken and ultrasound of your left leg.

## 2013-02-11 NOTE — Progress Notes (Signed)
Patient ID: Steven Krause, male   DOB: 29-Dec-1940, 72 y.o.   MRN: [id number]   Location:  [facility] [place] [facility]  Code Status: full code  No Known Allergies  Chief Complaint  Patient presents with  . Medical Managment of Chronic Issues    6 month f/u   . other    LT foot/leg swelling x 1 week, SOB/wheezing, coughing  x 1 week    HPI: Patient is a 72 y.o. white male seen in the office today for 6 month follow up, but c/o left foot and leg swelling for a week associated with shortness of breath and coughing.  Has been wheezing some.  No fever, chills.  No sick contacts known.  No PND.  Does have nocturia--hourly.  Checks to make sure all is ok overnight.  No orthopnea.  Sees cardiologist next week.  [facility].    Review of Systems:  Review of Systems  Constitutional: Negative for fever, chills, weight loss and malaise/fatigue.  HENT: Positive for hearing loss. Negative for congestion.   Eyes: Negative for blurred vision.  Respiratory: Positive for cough, sputum production, shortness of breath and wheezing.   Cardiovascular: Positive for leg swelling. Negative for chest pain, palpitations, orthopnea and PND.  Gastrointestinal: Negative for abdominal pain, constipation, blood in stool and melena.  Genitourinary: Positive for frequency.  Musculoskeletal: Negative for falls.  Skin: Positive for itching. Negative for rash.  Neurological: Negative for dizziness, loss of consciousness, weakness and headaches.  Endo/Heme/Allergies: Bruises/bleeds easily.  Psychiatric/Behavioral:       Intellectual disability     Past Medical History  Diagnosis Date  . Acquired polycythemia 01/05/2011  . Iron excess 01/05/2011  . Hypertension   . Allergy   . Memory loss   . Unspecified hearing loss   . Hypopotassemia   . Other specified cardiac dysrhythmias(427.89)   . Polycythemia, secondary   . 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 Unspecified  disorder of kidney and ureter   . Tobacco use disorder   . Hernia of unspecified site of abdominal cavity without mention of obstruction or gangrene   . Unspecified disorder of skin and subcutaneous tissue   . Dizziness and giddiness   . Diarrhea   . Abdominal pain, generalized     Past Surgical History  Procedure Laterality Date  . Cholecystectomy    . Ventral hernia repair    . Inflamed colon      Social History:   reports that he has been smoking Pipe.  He has never used smokeless tobacco. He reports that he does not drink alcohol or use illicit drugs.  Family History  Problem Relation Age of Onset  . Colon polyps Mother 70  . Colon cancer Mother   . Stomach cancer Neg Hx   . Rectal cancer Neg Hx   . Heart disease Father   . Diabetes Sister   . Cancer Brother   . Diabetes Sister   . Diabetes Sister   . Heart disease Sister     Medications: Patient's Medications  New Prescriptions   No medications on file  Previous Medications   AMLODIPINE (NORVASC) 10 MG TABLET    ONE TABLET EVERY DAY FOR BLOOD PRESSURE   ASPIRIN 81 MG TABLET    Take 81 mg by mouth daily.     BISACODYL (DULCOLAX PO)    Take by mouth as needed.   FUROSEMIDE (LASIX) 40 MG TABLET    TAKE 1 TABLET IN THE  MORNING AND TAKE 1 TABLET IN THE EVENING   HYDROCODONE-ACETAMINOPHEN (NORCO/VICODIN) 5-325 MG PER TABLET    Take one tablet once a day for pain   PINDOLOL (VISKEN) 5 MG TABLET    Take 5 mg by mouth 2 (two) times daily.     POTASSIUM CHLORIDE SA (K-DUR,KLOR-CON) 20 MEQ TABLET    Take one tablet twice daily for potassium   TAMSULOSIN (FLOMAX) 0.4 MG CAPS    TAKE ONE (1) CAPSULE BY MOUTH ONCE DAILY.  Modified Medications   No medications on file  Discontinued Medications   No medications on file     Physical Exam: Filed Vitals:   02/11/13 1317  BP: 138/80  Pulse: 76  Temp: 97.4 F (36.3 C)  TempSrc: Oral  Resp: 14  Weight: 210 lb 12.8 oz (95.618 kg)  SpO2: 98%  Physical Exam    Constitutional: He is oriented to person, place, and time. He appears well-developed and well-nourished. No distress.  Obese white male  HENT:  Head: Normocephalic and atraumatic.  Very poor dentition with halitosis  Neck: Neck supple. No JVD present. No tracheal deviation present. No thyromegaly present.  Cardiovascular: Intact distal pulses.   irreg irreg, 2+ edema left greater than right, some drainage from left leg, no blisters at present  Pulmonary/Chest: Effort normal. No stridor. He has rales.  Bilateral bases  Abdominal: Soft. He exhibits no distension. There is no tenderness. A hernia is present.  Musculoskeletal: Normal range of motion.  Lymphadenopathy:    He has no cervical adenopathy.  Neurological: He is alert and oriented to person, place, and time.  Skin: Skin is warm and dry.  Chronic venous stasis changes also present    Labs reviewed: Basic Metabolic Panel:  Recent Labs  19/14/78 1542 09/21/12 1016  NA 141 140  K 4.1 3.6  CL 99  --   CO2 26 28  GLUCOSE 90 105  BUN 9 10.1  CREATININE 0.94 0.9  CALCIUM 9.3 9.3   Liver Function Tests:  Recent Labs  08/27/12 1542 09/21/12 1016  AST 40 37*  ALT 37 38  ALKPHOS 85 74  BILITOT 0.4 0.46  PROT 7.8 7.6  ALBUMIN  --  3.4*  CBC:  Recent Labs  08/27/12 1542 09/21/12 1016  WBC 7.7 7.0  NEUTROABS 5.6 4.9  HGB 16.0 15.4  HCT 46.1 45.3  MCV 90 91.4  PLT  --  275   Lab Results  Component Value Date   HGBA1C 5.9* 08/27/2012   Assessment/Plan 1. Acute CHF - EKG 12-Lead shows he is now in a different heart rhythm--appears he is in second degree heart block (though EKGs were difficult to get and wavy baseline) - DG Chest 2 View was ordered for today due to rales on exam - check cmp today for renal function - add aldactone 25mg  daily as he already on lasix 40mg  po bid and potassium supplementation -stop potassium supplement since starting aldactone -recheck bmp next visit in 1 wk  2. Edema of right  lower extremity -venous doppler to r/o DVT (suspect he may have an old DVT present causing worsened venous stasis change vs simply asymmetric edema)  3. Hyperglycemia -f/u labs: - Comprehensive metabolic panel - Hemoglobin A1c  4. Hypertension -at goal with current therapy - CBC with Differential - Comprehensive metabolic panel  5. Tobacco use disorder -continues to smoke pipe--no desire to quit even though his smoking makes his girlfriend's breathing worse  6. Obesity, unspecified -encouraged exercise and diet for  weight loss  Labs/tests ordered:  Cbc, cmp, hba1c today;  EKG with 2nd degree av block, Cxr due to rales, venous doppler left leg due to swelling Next appt:  1 wk and try to move up Cardiology appt to this week sometime due to dyspnea and edema, new heart rhythm

## 2013-02-12 ENCOUNTER — Other Ambulatory Visit: Payer: Medicare Other

## 2013-02-12 ENCOUNTER — Ambulatory Visit
Admission: RE | Admit: 2013-02-12 | Discharge: 2013-02-12 | Disposition: A | Discharge status: Home / Self Care | Payer: Medicare Other | Source: Ambulatory Visit | Attending: Internal Medicine | Admitting: Internal Medicine

## 2013-02-12 ENCOUNTER — Encounter: Payer: Self-pay | Admitting: *Deleted

## 2013-02-12 DIAGNOSIS — I509 Heart failure, unspecified: Secondary | ICD-10-CM

## 2013-02-12 DIAGNOSIS — R600 Localized edema: Secondary | ICD-10-CM

## 2013-02-12 LAB — COMPREHENSIVE METABOLIC PANEL
ALT: 23 IU/L (ref 0–44)
AST: 27 IU/L (ref 0–40)
Albumin/Globulin Ratio: 1.3 (ref 1.1–2.5)
Albumin: 4 g/dL (ref 3.5–4.8)
Alkaline Phosphatase: 91 IU/L (ref 39–117)
BUN/Creatinine Ratio: 12 (ref 10–22)
BUN: 10 mg/dL (ref 8–27)
CO2: 25 mmol/L (ref 18–29)
Calcium: 9.5 mg/dL (ref 8.6–10.2)
Chloride: 100 mmol/L (ref 97–108)
Creatinine, Ser: 0.86 mg/dL (ref 0.76–1.27)
GFR calc Af Amer: 100 mL/min/{1.73_m2} (ref >59)
GFR calc non Af Amer: 87 mL/min/{1.73_m2} (ref >59)
Globulin, Total: 3.1 g/dL (ref 1.5–4.5)
Glucose: 87 mg/dL (ref 65–99)
Potassium: 3.9 mmol/L (ref 3.5–5.2)
Sodium: 142 mmol/L (ref 134–144)
Total Bilirubin: 0.4 mg/dL (ref 0.0–1.2)
Total Protein: 7.1 g/dL (ref 6.0–8.5)

## 2013-02-12 LAB — HEMOGLOBIN A1C
Est. average glucose Bld gHb Est-mCnc: 123 mg/dL
Hgb A1c MFr Bld: 5.9 % — ABNORMAL HIGH (ref 4.8–5.6)

## 2013-02-12 LAB — CBC WITH DIFFERENTIAL/PLATELET
Basophils Absolute: 0 10*3/uL (ref 0.0–0.2)
Basos: 0 %
Eos: 2 %
Eosinophils Absolute: 0.1 10*3/uL (ref 0.0–0.4)
HCT: 43.3 % (ref 37.5–51.0)
Hemoglobin: 14.6 g/dL (ref 12.6–17.7)
Immature Grans (Abs): 0 10*3/uL (ref 0.0–0.1)
Immature Granulocytes: 0 %
Lymphocytes Absolute: 1.1 10*3/uL (ref 0.7–3.1)
Lymphs: 15 %
MCH: 30.7 pg (ref 26.6–33.0)
MCHC: 33.7 g/dL (ref 31.5–35.7)
MCV: 91 fL (ref 79–97)
Monocytes Absolute: 0.6 10*3/uL (ref 0.1–0.9)
Monocytes: 8 %
Neutrophils Absolute: 5.6 10*3/uL (ref 1.4–7.0)
Neutrophils Relative %: 75 %
RBC: 4.75 x10E6/uL (ref 4.14–5.80)
RDW: 13.3 % (ref 12.3–15.4)
WBC: 7.5 10*3/uL (ref 3.4–10.8)

## 2013-02-12 NOTE — Progress Notes (Signed)
Labs are normal.

## 2013-02-13 ENCOUNTER — Other Ambulatory Visit: Payer: Medicare Other

## 2013-02-14 ENCOUNTER — Ambulatory Visit (INDEPENDENT_AMBULATORY_CARE_PROVIDER_SITE_OTHER): Payer: Medicare Other | Admitting: Cardiology

## 2013-02-14 ENCOUNTER — Encounter: Payer: Self-pay | Admitting: Cardiology

## 2013-02-14 VITALS — BP 134/76 | HR 52 | Ht 69.0 in | Wt 209.0 lb

## 2013-02-14 DIAGNOSIS — I498 Other specified cardiac arrhythmias: Secondary | ICD-10-CM

## 2013-02-14 DIAGNOSIS — I4949 Other premature depolarization: Secondary | ICD-10-CM

## 2013-02-14 DIAGNOSIS — R001 Bradycardia, unspecified: Secondary | ICD-10-CM

## 2013-02-14 DIAGNOSIS — I482 Chronic atrial fibrillation, unspecified: Secondary | ICD-10-CM | POA: Insufficient documentation

## 2013-02-14 DIAGNOSIS — I493 Ventricular premature depolarization: Secondary | ICD-10-CM

## 2013-02-14 DIAGNOSIS — I10 Essential (primary) hypertension: Secondary | ICD-10-CM

## 2013-02-14 DIAGNOSIS — F17200 Nicotine dependence, unspecified, uncomplicated: Secondary | ICD-10-CM

## 2013-02-14 DIAGNOSIS — E669 Obesity, unspecified: Secondary | ICD-10-CM

## 2013-02-14 MED ORDER — PINDOLOL 5 MG PO TABS: 5.0000 mg | ORAL_TABLET | Freq: Every day | ORAL | Status: DC

## 2013-02-14 NOTE — Progress Notes (Signed)
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 He has minimal evidence of structural heart disease with a borderline left ventricular systolic function at around 50% to 55% by echo in 2011. Myoview in Aug 2011 was low risk. His palpitations have been well controlled with a low dose of pindolol. Other agents were avoided because of a tendency to bradycardia. He is seen in the office today as a referral from his primary care for an abnormal EKG. The pt is asymptomatic. He lives alone but has a friend drive him around. He denies syncope, palpations, or unusual dyspnea.    Current Outpatient Prescriptions  Medication Sig Dispense Refill  . amLODipine (NORVASC) 10 MG tablet ONE TABLET EVERY DAY FOR BLOOD PRESSURE  30 tablet  1  . aspirin 81 MG tablet Take 81 mg by mouth daily.        . furosemide (LASIX) 40 MG tablet TAKE 1 TABLET IN THE MORNING AND TAKE 1 TABLET IN THE EVENING  60 tablet  1  . HYDROcodone-acetaminophen (NORCO/VICODIN) 5-325 MG per tablet Take one tablet once a day for pain  60 tablet  5  . pindolol (VISKEN) 5 MG tablet Take 1 tablet (5 mg total) by mouth daily.      Marland Kitchen spironolactone (ALDACTONE) 25 MG tablet Take 1 tablet (25 mg total) by mouth daily.  30 tablet  3  . tamsulosin (FLOMAX) 0.4 MG CAPS TAKE ONE (1) CAPSULE BY MOUTH ONCE DAILY.  30 capsule  5  . Bisacodyl (DULCOLAX PO) Take by mouth as needed.       No current facility-administered medications for this visit.    No Known Allergies  History   Social History  . Marital Status: Single    Spouse Name: N/A    Number of Children: N/A  . Years of Education: N/A   Occupational History  . Not on file.   Social History Main Topics  . Smoking status: Current Every Day Smoker    Types: Pipe  . Smokeless  tobacco: Never Used  . Alcohol Use: No  . Drug Use: No  . Sexual Activity: Not on file   Other Topics Concern  . Not on file   Social History Narrative  . No narrative on file     Review of Systems: General: negative for chills, fever, night sweats or weight changes.  Cardiovascular: negative for chest pain, dyspnea on exertion, edema, orthopnea, palpitations, paroxysmal nocturnal dyspnea or shortness of breath Dermatological: negative for rash Respiratory: negative for cough or wheezing Urologic: negative for hematuria Abdominal: negative for nausea, vomiting, diarrhea, bright red blood per rectum, melena, or hematemesis Neurologic: negative for visual changes, syncope, or dizziness Terrible dentition All other systems reviewed and are otherwise negative except as noted above.    Blood pressure 134/76, pulse 52, height 5\' 9"  (1.753 m), weight 209 lb (94.802 kg).  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  Hypertension Controlled  Obesity, unspecified .  Tobacco use disorder .   PLAN  I reviewed his  EKG with Dr Rennis Golden who agrees he is in a junctional rhythm. I discussed the pt's case with Dr Royann Shivers. He feels we should taper off his beta blocker. We'll cut him down to 5 mg of Pindolol a day and see him next week. If he remains stable we'll stop his Pindolol altogether.   Kian Gamarra KPA-C 02/14/2013 3:20 PM

## 2013-02-14 NOTE — Assessment & Plan Note (Signed)
Controlled.  

## 2013-02-14 NOTE — Patient Instructions (Signed)
Decrease Pindolol to 5 mg daily. Your physician recommends that you schedule a follow-up appointment in: one week with [facility]

## 2013-02-14 NOTE — Assessment & Plan Note (Signed)
Chronic, started on Pindolol for this in the past.

## 2013-02-14 NOTE — Assessment & Plan Note (Signed)
This is new since his LOV but he appears to be asymptomatic.

## 2013-02-15 ENCOUNTER — Encounter: Payer: Self-pay | Admitting: Cardiology

## 2013-02-18 ENCOUNTER — Encounter: Payer: Self-pay | Admitting: *Deleted

## 2013-02-18 ENCOUNTER — Ambulatory Visit (INDEPENDENT_AMBULATORY_CARE_PROVIDER_SITE_OTHER): Payer: Medicare Other | Admitting: Internal Medicine

## 2013-02-18 ENCOUNTER — Encounter: Payer: Self-pay | Admitting: Internal Medicine

## 2013-02-18 VITALS — BP 112/64 | HR 52 | Temp 97.1°F | Resp 14 | Wt 206.0 lb

## 2013-02-18 DIAGNOSIS — R600 Localized edema: Secondary | ICD-10-CM

## 2013-02-18 DIAGNOSIS — F17200 Nicotine dependence, unspecified, uncomplicated: Secondary | ICD-10-CM

## 2013-02-18 DIAGNOSIS — R609 Edema, unspecified: Secondary | ICD-10-CM

## 2013-02-18 DIAGNOSIS — I498 Other specified cardiac arrhythmias: Secondary | ICD-10-CM

## 2013-02-18 NOTE — Addendum Note (Signed)
Addended by: Maurice Small on: 02/18/2013 04:19 PM   Modules accepted: Orders

## 2013-02-18 NOTE — Patient Instructions (Signed)
Continue to cut back on smoking

## 2013-02-18 NOTE — Progress Notes (Signed)
Patient ID: Steven Krause, male   DOB: [birth year], 72 y.o.   MRN: [id number]   Location:  [facility] No Known Allergies  Chief Complaint  Patient presents with  . Follow-up    1 week follow-up, patient will f/u with Cardiologist tomorrow     HPI: Patient is a 72 y.o. white male here for f/u due to abnormal EKG and concern for increased dyspnea and rales on exam, but when saw cardiology was felt to be ventricular bigeminy.  Is following up again tomorrow.  Seems his dyspnea and abnormal lung sounds is more likely due to his pulmonary disease from smoking a pipe.  Says breathing is all right.  Still smoking but less--only after eating meals.  Left leg swelling had gotten better, but now it's back to being swollen again.  Doppler was negative for DVT.    Review of Systems:  Review of Systems  Constitutional: Negative for malaise/fatigue.  Respiratory: Negative for shortness of breath.   Cardiovascular: Positive for leg swelling. Negative for chest pain and palpitations.  Gastrointestinal: Negative for abdominal pain.  Genitourinary: Negative for dysuria.  Musculoskeletal: Negative for falls.  Neurological: Negative for headaches.     Past Medical History  Diagnosis Date  . Acquired polycythemia 01/05/2011  . Iron excess 01/05/2011  . Hypertension   . Allergy   . Memory loss   . Unspecified hearing loss   . Hypopotassemia   . Other specified cardiac dysrhythmias(427.89)   . Polycythemia, secondary   . 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 Unspecified disorder of kidney and ureter   . Tobacco use disorder   . Hernia of unspecified site of abdominal cavity without mention of obstruction or gangrene   . Unspecified disorder of skin and subcutaneous tissue   . Dizziness and giddiness   . Diarrhea   . Abdominal pain, generalized   . Systemic hypertension     Past Surgical History  Procedure Laterality Date  . Cholecystectomy    . Ventral  hernia repair    . Inflamed colon    . US echocardiography  11/03/09    EF 50-55%,  . Nm myocar perf wall motion  8//30/11    normal    Social History:   reports that he has been smoking Pipe.  He has never used smokeless tobacco. He reports that he does not drink alcohol or use illicit drugs.  Family History  Problem Relation Age of Onset  . Colon polyps Mother 59  . Colon cancer Mother   . Stomach cancer Neg Hx   . Rectal cancer Neg Hx   . Heart disease Father   . Diabetes Sister   . Cancer Brother   . Diabetes Sister   . Diabetes Sister   . Heart disease Sister     Medications: Patient's Medications  New Prescriptions   No medications on file  Previous Medications   AMLODIPINE (NORVASC) 10 MG TABLET    ONE TABLET EVERY DAY FOR BLOOD PRESSURE   ASPIRIN 81 MG TABLET    Take 81 mg by mouth daily.     BISACODYL (DULCOLAX PO)    Take by mouth as needed.   FUROSEMIDE (LASIX) 40 MG TABLET    TAKE 1 TABLET IN THE MORNING AND TAKE 1 TABLET IN THE EVENING   HYDROCODONE-ACETAMINOPHEN (NORCO/VICODIN) 5-325 MG PER TABLET    Take one tablet once a day for pain   PINDOLOL (VISKEN) 5 MG TABLET    Take  1 tablet (5 mg total) by mouth daily.   SPIRONOLACTONE (ALDACTONE) 25 MG TABLET    Take 1 tablet (25 mg total) by mouth daily.   TAMSULOSIN (FLOMAX) 0.4 MG CAPS    TAKE ONE (1) CAPSULE BY MOUTH ONCE DAILY.  Modified Medications   No medications on file  Discontinued Medications   No medications on file     Physical Exam: Filed Vitals:   02/18/13 1334  BP: 112/64  Pulse: 52  Temp: 97.1 F (36.2 C)  TempSrc: Oral  Resp: 14  Weight: 206 lb (93.441 kg)  SpO2: 99%  Physical Exam  Constitutional:  Obese white male, smells like tobacco smoke  Cardiovascular: Intact distal pulses.   Irregular; left lower extremity edema greater than right, varicosities bilaterally  Pulmonary/Chest: Effort normal.  Coarse rhonchi at bases  Abdominal: Soft. Bowel sounds are normal. He exhibits no  distension. There is no tenderness.  Musculoskeletal: Normal range of motion.  Neurological: He is alert.    Labs reviewed: Basic Metabolic Panel:  Recent Labs  69/62/95 1542 09/21/12 1016 02/11/13 1440  NA 141 140 142  K 4.1 3.6 3.9  CL 99  --  100  CO2 26 28 25   GLUCOSE 90 105 87  BUN 9 10.1 10  CREATININE 0.94 0.9 0.86  CALCIUM 9.3 9.3 9.5   Liver Function Tests:  Recent Labs  08/27/12 1542 09/21/12 1016 02/11/13 1440  AST 40 37* 27  ALT 37 38 23  ALKPHOS 85 74 91  BILITOT 0.4 0.46 0.4  PROT 7.8 7.6 7.1  ALBUMIN  --  3.4*  --   CBC:  Recent Labs  08/27/12 1542 09/21/12 1016 02/11/13 1440  WBC 7.7 7.0 7.5  NEUTROABS 5.6 4.9 5.6  HGB 16.0 15.4 14.6  HCT 46.1 45.3 43.3  MCV 90 91.4 91  PLT  --  275  --    Lab Results  Component Value Date   HGBA1C 5.9* 02/11/2013    Past Procedures: Bilateral venous doppler:   Negative for DVT  Reviewed visit with cardiology  Assessment/Plan 1. Edema of left lower extremity - Lower Extremity Arterial Doppler Bilateral; Future - will assess arterial circulation as above with ABIs due to smoking and asymmetric left leg edema  2. Ventricular bigeminy -being followed by cardiology and sees them again tomorrow  3. Tobacco use disorder -continues with pipe smoking, but has cut back some--discussed continuing to cut back due to problems with his circulation  Labs/tests ordered:  Arterial dopplers with abis Next appt:  6 wks

## 2013-02-19 ENCOUNTER — Other Ambulatory Visit: Payer: Self-pay | Admitting: Cardiovascular Disease

## 2013-02-19 ENCOUNTER — Ambulatory Visit (INDEPENDENT_AMBULATORY_CARE_PROVIDER_SITE_OTHER): Payer: Medicare Other | Admitting: Cardiovascular Disease

## 2013-02-19 ENCOUNTER — Encounter (HOSPITAL_COMMUNITY): Payer: Medicare Other

## 2013-02-19 ENCOUNTER — Other Ambulatory Visit: Payer: Self-pay | Admitting: Internal Medicine

## 2013-02-19 ENCOUNTER — Encounter: Payer: Self-pay | Admitting: Cardiovascular Disease

## 2013-02-19 VITALS — BP 110/70 | HR 56 | Resp 16 | Ht 69.0 in | Wt 206.9 lb

## 2013-02-19 DIAGNOSIS — R001 Bradycardia, unspecified: Secondary | ICD-10-CM

## 2013-02-19 DIAGNOSIS — R600 Localized edema: Secondary | ICD-10-CM | POA: Insufficient documentation

## 2013-02-19 DIAGNOSIS — F17200 Nicotine dependence, unspecified, uncomplicated: Secondary | ICD-10-CM

## 2013-02-19 DIAGNOSIS — I498 Other specified cardiac arrhythmias: Secondary | ICD-10-CM

## 2013-02-19 DIAGNOSIS — R609 Edema, unspecified: Secondary | ICD-10-CM

## 2013-02-19 DIAGNOSIS — I10 Essential (primary) hypertension: Secondary | ICD-10-CM

## 2013-02-19 NOTE — Assessment & Plan Note (Signed)
He is strongly encouraged to quit smoking, but it is pretty clear that he has no intention to do so.

## 2013-02-19 NOTE — Assessment & Plan Note (Signed)
This is a long-standing problem for him and has never really been symptomatic. He has a fixed coupling interval between his supraventricular beats and PVCs. The PVCs are monomorphic with a left bundle branch block morphology and inferior axis and a precordial transition zone in V2 to V3. This is consistent with right ventricular outflow tract origin. The fixed coupling interval suggests triggered arrhythmia. Beta blockers provided some relief in the past but he now has bradycardia even with pindolol and these medications will be discontinued. If he develops symptoms he may need a pacemaker to allow reinstitution of beta blocker therapy.

## 2013-02-19 NOTE — Progress Notes (Signed)
Patient ID: Steven Krause, male   DOB: 1940-10-01, 72 y.o.   MRN: [id number]      Reason for office visit Arrhythmia, leg edema  Since I last saw Steven Krause she has developed 2 new problems. He has fairly severe and asymmetrical edema of the left leg. This is not painful. He has deep pitting edema. He underwent a venous duplex ultrasound that showed no evidence of venous obstruction or thrombus.  He has long-standing ventricular bigeminy and his sinus rhythm has become slower and slower the years. A recent ECG performed in Dr. Ernest Krause office did not show any visible P waves this is still the case on his electrocardiogram today. In the past he has had sinus bradycardia as his background rhythm. He did not tolerate conventional beta blockers to do to worsening bradycardia with pindolol provided PVC suppression without symptomatic bradycardia.  His PVCs always had the same morphology (relatively narrow complex left bundle branch block morphology inferior axis with a precordial transition zone in V2 to V3). Previous workup has never shownidence of significant structural heart disease. LVEF has been 50-55%. There have no been any significant valvular abnormalities. He does not have left ventricular hypertrophy.  No Known Allergies  Current Outpatient Prescriptions  Medication Sig Dispense Refill  . amLODipine (NORVASC) 10 MG tablet ONE TABLET EVERY DAY FOR BLOOD PRESSURE  30 tablet  1  . aspirin 81 MG tablet Take 81 mg by mouth daily.        . Bisacodyl (DULCOLAX PO) Take by mouth as needed.      . furosemide (LASIX) 40 MG tablet TAKE 1 TABLET IN THE MORNING AND TAKE 1 TABLET IN THE EVENING  60 tablet  1  . HYDROcodone-acetaminophen (NORCO/VICODIN) 5-325 MG per tablet Take one tablet once a day for pain  60 tablet  5  . pindolol (VISKEN) 5 MG tablet Take 1 tablet (5 mg total) by mouth daily.      Marland Kitchen spironolactone (ALDACTONE) 25 MG tablet Take 1 tablet (25 mg total) by mouth daily.  30 tablet  3  .  tamsulosin (FLOMAX) 0.4 MG CAPS TAKE ONE (1) CAPSULE BY MOUTH ONCE DAILY.  30 capsule  5   No current facility-administered medications for this visit.    Past Medical History  Diagnosis Date  . Acquired polycythemia 01/05/2011  . Iron excess 01/05/2011  . Hypertension   . Allergy   . Memory loss   . Unspecified hearing loss   . Hypopotassemia   . Other specified cardiac dysrhythmias(427.89)   . Polycythemia, secondary   . 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 Unspecified disorder of kidney and ureter   . Tobacco use disorder   . Hernia of unspecified site of abdominal cavity without mention of obstruction or gangrene   . Unspecified disorder of skin and subcutaneous tissue   . Dizziness and giddiness   . Diarrhea   . Abdominal pain, generalized   . Systemic hypertension     Past Surgical History  Procedure Laterality Date  . Cholecystectomy    . Ventral hernia repair    . Inflamed colon    . US echocardiography  11/03/09    EF 50-55%,  . Nm myocar perf wall motion  8//30/11    normal    Family History  Problem Relation Age of Onset  . Colon polyps Mother 24  . Colon cancer Mother   . Stomach cancer Neg Hx   . Rectal cancer Neg Hx   . Heart disease Father   .  Diabetes Sister   . Cancer Brother   . Diabetes Sister   . Diabetes Sister   . Heart disease Sister     History   Social History  . Marital Status: Single    Spouse Name: N/A    Number of Children: N/A  . Years of Education: N/A   Occupational History  . Not on file.   Social History Main Topics  . Smoking status: Current Every Day Smoker    Types: Pipe  . Smokeless tobacco: Never Used  . Alcohol Use: No  . Drug Use: No  . Sexual Activity: Not on file   Other Topics Concern  . Not on file   Social History Narrative  . No narrative on file    Review of systems: Significant for left leg edema generally up to the level of the knee. The patient specifically denies any chest pain at rest or with  exertion, dyspnea at rest or with exertion, orthopnea, paroxysmal nocturnal dyspnea, syncope, palpitations, focal neurological deficits, intermittent claudication, unexplained weight gain, cough, hemoptysis or wheezing.  The patient also denies abdominal pain, nausea, vomiting, dysphagia, diarrhea, constipation, polyuria, polydipsia, dysuria, hematuria, frequency, urgency, abnormal bleeding or bruising, fever, chills, unexpected weight changes, mood swings, change in skin or hair texture, change in voice quality, auditory or visual problems, allergic reactions or rashes, new musculoskeletal complaints other than usual "aches and pains".   PHYSICAL EXAM BP 110/70  Pulse 56  Resp 16  Ht 5\' 9"  (1.753 m)  Wt 206 lb 14.4 oz (93.849 kg)  BMI 30.54 kg/m2  General: Alert, oriented x3, no distress Head: no evidence of trauma, PERRL, EOMI, no exophtalmos or lid lag, no myxedema, no xanthelasma; normal ears, nose and oropharynx; poor dentition with multiple caries and missing teeth Neck: normal jugular venous pulsations and no hepatojugular reflux; brisk carotid pulses without delay and no carotid bruits Chest: clear to auscultation, no signs of consolidation by percussion or palpation, normal fremitus, symmetrical and full respiratory excursions Cardiovascular: normal position and quality of the apical impulse, irregularly irregular bigeminal rhythm, normal first and second heart sounds, no murmurs, rubs or gallops Abdomen: no tenderness or distention, no masses by palpation, no abnormal pulsatility or arterial bruits, normal bowel sounds, no hepatosplenomegaly Extremities: no clubbing, cyanosis; 2+ radial, ulnar and brachial pulses bilaterally; 2+ right femoral, posterior tibial and dorsalis pedis pulses; 2+ left femoral, posterior tibial and dorsalis pedis pulses; no subclavian or femoral bruits A single 1.5 cm nontender fairly soft lymph node is palpable in the left inguinal region. There are no  palpable lymph nodes in the right inguinal region. He has 3+ deep pitting edema of the left foot and calf Neurological: grossly nonfocal   EKG:  There are no visible P waves. Each narrow complex beats is followed by a PVC with a fixed coupling interval. There is baseline artifact and it is hard to tell whether he has atrial standstill or very fine atrial fibrillation. PVcs all have RVOT morphology  BMET    Component Value Date/Time   NA 142 02/11/2013 1440   NA 140 09/21/2012 1016   NA 141 01/12/2009 1259   K 3.9 02/11/2013 1440   K 3.6 09/21/2012 1016   K 4.0 01/12/2009 1259   CL 100 02/11/2013 1440   CL 102 01/12/2009 1259   CO2 25 02/11/2013 1440   CO2 28 09/21/2012 1016   CO2 30 01/12/2009 1259   GLUCOSE 87 02/11/2013 1440   GLUCOSE 105 09/21/2012 1016  GLUCOSE 102 01/12/2009 1259   BUN 10 02/11/2013 1440   BUN 10.1 09/21/2012 1016   BUN 9 01/12/2009 1259   CREATININE 0.86 02/11/2013 1440   CREATININE 0.9 09/21/2012 1016   CREATININE 0.8 01/12/2009 1259   CALCIUM 9.5 02/11/2013 1440   CALCIUM 9.3 09/21/2012 1016   CALCIUM 9.2 01/12/2009 1259   GFRNONAA 87 02/11/2013 1440   GFRAA 100 02/11/2013 1440     ASSESSMENT AND PLAN Junctional bradycardia I am not certain where that the atrial mechanism is currently atrial standstill versus extremely fine atrial fibrillation. The baseline appears relatively isoelectric although there is some artifact which makes interpretation difficult. He is in perpetual ventricular bigeminy but the interval between the narrow QRS complex beats is irregular suggesting possible atrial fibrillation. I think he needs to discontinue the atenolol and have asked him to wean this gradually over the next several days.  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 He has a fixed coupling interval between his supraventricular beats and PVCs. The PVCs are monomorphic with a left bundle branch block morphology and inferior axis  and a precordial transition zone in V2 to V3. This is consistent with right ventricular outflow tract origin. The fixed coupling interval suggests triggered arrhythmia. Beta blockers provided some relief in the past but he now has bradycardia even with pindolol and these medications will be discontinued. If he develops symptoms he may need a pacemaker to allow reinstitution of beta blocker therapy.  Tobacco use disorder He is strongly encouraged to quit smoking, but it is pretty clear that he has no intention to do so.  Hypertension Good control. I suspect his blood pressure remained normal even after we stopped the beta blocker  Leg edema, left He has moderate to severe leg edema and the asymmetry is quite striking. He had a recent negative Doppler venous study. He has a small and mobile lymph node that is palpable in the inguinal region, but this is only borderline pathological. One worries about pelvic obstruction of either the venous or lymphatic drainage. Consider CT with contrast of the pelvis  Orders Placed This Encounter  Procedures  . EKG 12-Lead   Patient Instructions  Decrease Pindolol to 1/2 tablet for 3 days then STOP.  Your physician recommends that you schedule a follow-up appointment in: 3 weeks     Keyunna Coco  Thurmon Fair, MD, [facility] [phone] office [phone] pager

## 2013-02-19 NOTE — Assessment & Plan Note (Signed)
I am not certain where that the atrial mechanism is currently atrial standstill versus extremely fine atrial fibrillation. The baseline appears relatively isoelectric although there is some artifact which makes interpretation difficult. He is in perpetual ventricular bigeminy but the interval between the narrow QRS complex beats is irregular suggesting possible atrial fibrillation. I think he needs to discontinue the atenolol and have asked him to wean this gradually over the next several days.

## 2013-02-19 NOTE — Assessment & Plan Note (Signed)
He has moderate to severe leg edema and the asymmetry is quite striking. He had a recent negative Doppler venous study. He has a small and mobile lymph node that is palpable in the inguinal region, but this is only borderline pathological. One worries about pelvic obstruction of either the venous or lymphatic drainage. Consider CT with contrast of the pelvis

## 2013-02-19 NOTE — Patient Instructions (Signed)
Decrease Pindolol to 1/2 tablet for 3 days then STOP.  Your physician recommends that you schedule a follow-up appointment in: 3 weeks

## 2013-02-19 NOTE — Assessment & Plan Note (Addendum)
Good control. I suspect his blood pressure will remain normal even after we stop the beta blocker

## 2013-02-20 ENCOUNTER — Ambulatory Visit (HOSPITAL_COMMUNITY): Payer: Medicare Other

## 2013-02-20 ENCOUNTER — Ambulatory Visit: Payer: Medicare Other | Admitting: Cardiology

## 2013-02-21 ENCOUNTER — Other Ambulatory Visit: Payer: Self-pay | Admitting: Cardiovascular Disease

## 2013-02-21 NOTE — Telephone Encounter (Signed)
Rx refused, please see office note with Dr Royann Shivers on 02/19/2013.

## 2013-02-25 ENCOUNTER — Other Ambulatory Visit: Payer: Self-pay | Admitting: Cardiovascular Disease

## 2013-02-25 NOTE — Telephone Encounter (Signed)
Rx refused. Per last office visit, on 02/19/2013 with Dr Royann Shivers, he decreased Pindolol to 1/2 tablet for 3 days then STOP. See patient instructions/plan in note.

## 2013-03-14 ENCOUNTER — Encounter: Payer: Self-pay | Admitting: Cardiovascular Disease

## 2013-03-14 ENCOUNTER — Ambulatory Visit (INDEPENDENT_AMBULATORY_CARE_PROVIDER_SITE_OTHER): Payer: Medicare Other | Admitting: Cardiovascular Disease

## 2013-03-14 VITALS — BP 140/80 | HR 52 | Resp 16 | Ht 69.0 in | Wt 202.6 lb

## 2013-03-14 DIAGNOSIS — R001 Bradycardia, unspecified: Secondary | ICD-10-CM

## 2013-03-14 DIAGNOSIS — I499 Cardiac arrhythmia, unspecified: Secondary | ICD-10-CM

## 2013-03-14 DIAGNOSIS — I498 Other specified cardiac arrhythmias: Secondary | ICD-10-CM

## 2013-03-14 NOTE — Patient Instructions (Signed)
Continue same medications.    If you have any questions please contact Idan Prime at [phone].  Your physician recommends that you schedule a follow-up appointment in: 6 MONTHS.

## 2013-03-14 NOTE — Assessment & Plan Note (Signed)
It is quite challenging to distinguish atrial fibrillation with slow ventricular response from atrial standstill with a slow junctional escape rhythm. The only important reason to make the distinction would be related to anti-coagulation if he has atrial fibrillation. Judging by his CHADSVasc2 score, this may be less relevant at this time. His score is only 2 and the use of potent anticoagulants would probably to a higher increase in bleeding risk that it would reduce his risk of stroke. I have advised that he continue to treatment with aspirin for stroke prevention.  Followup in 6 months. He is instructed to call me sooner if he develops fatigue syncope or shortness of breath

## 2013-03-14 NOTE — Progress Notes (Signed)
Patient ID: Steven Krause, male   DOB: 1941-03-01, 73 y.o.   MRN: [id number]      Reason for office visit Steven Krause  Steven Krause returns in followup after discontinuation of beta blocker therapy. He remains markedly bradycardic but also remains completely asymptomatic. 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 It is very difficult to identify the mechanism of atrial activity. There is some baseline fine variability in the isoelectric line that may be artifactual or may represent a very fine atrial fibrillation. There is definitely no evidence of normal atrial beats. Steven Krause interval is variable, analysis made even more difficult by the frequent PVCs.  He has systemic hypertension and smokes. He has never had a stroke/TIA or other embolic event. He denies diabetes mellitus. He does not have significant valvular heart disease or congestive heart failure. His leg edema is substantially better.  Specifically denies problems with fatigue, dizziness, presyncope or syncope and dyspnea.   No Known Allergies  Current Outpatient Prescriptions  Medication Sig Dispense Refill  . amLODipine (NORVASC) 10 MG tablet ONE TABLET EVERY DAY FOR BLOOD PRESSURE  30 tablet  1  . aspirin 81 MG tablet Take 81 mg by mouth daily.        . Bisacodyl (DULCOLAX PO) Take by mouth as needed.      . furosemide (LASIX) 40 MG tablet TAKE 1 TABLET IN THE MORNING AND TAKE 1 TABLET IN THE EVENING  60 tablet  1  . HYDROcodone-acetaminophen (NORCO/VICODIN) 5-325 MG per tablet Take one tablet once a day for pain  60 tablet  5  . spironolactone (ALDACTONE) 25 MG tablet Take 1 tablet (25 mg total) by mouth daily.  30 tablet  3  . tamsulosin (FLOMAX) 0.4 MG CAPS TAKE ONE (1) CAPSULE BY MOUTH ONCE DAILY.  30 capsule  5   No current facility-administered medications for this visit.    Past Medical History   Diagnosis Date  . Acquired polycythemia 01/05/2011  . Iron excess 01/05/2011  . Hypertension   . Allergy   . Memory loss   . Unspecified hearing loss   . Hypopotassemia   . Other specified cardiac dysrhythmias(427.89)   . Polycythemia, secondary   . 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 Unspecified disorder of kidney and ureter   . Tobacco use disorder   . Hernia of unspecified site of abdominal cavity without mention of obstruction or gangrene   . Unspecified disorder of skin and subcutaneous tissue   . Dizziness and giddiness   . Diarrhea   . Abdominal pain, generalized   . Systemic hypertension     Past Surgical History  Procedure Laterality Date  . Cholecystectomy    . Ventral hernia repair    . Inflamed colon    . US echocardiography  11/03/09    EF 50-55%,  . Nm myocar perf wall motion  8//30/11    normal    Family History  Problem Relation Age of Onset  . Colon polyps Mother 38  . Colon cancer Mother   . Stomach cancer Neg Hx   . Rectal cancer Neg Hx   . Heart disease Father   . Diabetes Sister   . Cancer Brother   . Diabetes Sister   . Diabetes Sister   . Heart disease Sister     History   Social History  . Marital Status: Single    Spouse Name: N/A  Number of Children: N/A  . Years of Education: N/A   Occupational History  . Not on file.   Social History Main Topics  . Smoking status: Current Every Day Smoker    Types: Pipe  . Smokeless tobacco: Never Used  . Alcohol Use: No  . Drug Use: No  . Sexual Activity: Not on file   Other Topics Concern  . Not on file   Social History Narrative  . No narrative on file    Review of systems: The patient specifically denies any chest pain at rest or with exertion, dyspnea at rest or with exertion, orthopnea, paroxysmal nocturnal dyspnea, syncope, palpitations, focal neurological deficits, intermittent claudication, unexplained weight gain, cough, hemoptysis or wheezing.  The patient also denies  abdominal pain, nausea, vomiting, dysphagia, diarrhea, constipation, polyuria, polydipsia, dysuria, hematuria, frequency, urgency, abnormal bleeding or bruising, fever, chills, unexpected weight changes, mood swings, change in skin or hair texture, change in voice quality, auditory or visual problems, allergic reactions or rashes, new musculoskeletal complaints other than usual "aches and pains".   PHYSICAL EXAM BP 140/80  Pulse 52  Ht 5\' 9"  (1.753 m)  Wt 202 lb 9.6 oz (91.899 kg)  BMI 29.91 kg/m2  General: Alert, oriented x3, no distress  Head: no evidence of trauma, PERRL, EOMI, no exophtalmos or lid lag, no myxedema, no xanthelasma; normal ears, nose and oropharynx; poor dentition with multiple caries and missing teeth  Neck: normal jugular venous pulsations and no hepatojugular reflux; brisk carotid pulses without delay and no carotid bruits  Chest: clear to auscultation, no signs of consolidation by percussion or palpation, normal fremitus, symmetrical and full respiratory excursions  Cardiovascular: normal position and quality of the apical impulse, irregularly irregular bigeminal rhythm, normal first and second heart sounds, no murmurs, rubs or gallops  Abdomen: no tenderness or distention, no masses by palpation, no abnormal pulsatility or arterial bruits, normal bowel sounds, no hepatosplenomegaly  Extremities: no clubbing, cyanosis; 2+ radial, ulnar and brachial pulses bilaterally; 2+ right femoral, posterior tibial and dorsalis pedis pulses; 2+ left femoral, posterior tibial and dorsalis pedis pulses; no subclavian or femoral bruits  A single 1.5 cm nontender fairly soft lymph node is palpable in the left inguinal region. There are no palpable lymph nodes in the right inguinal region. He has 1-2+ deep pitting edema of the left foot and calf  Neurological: grossly nonfocal   EKG: There are no visible P waves. Each narrow complex beats is followed by a PVC with a fixed coupling interval.  There is baseline artifact and it is hard to tell whether he has atrial standstill or very fine atrial fibrillation. PVcs all have RVOT morphology  BMET    Component Value Date/Time   NA 142 02/11/2013 1440   NA 140 09/21/2012 1016   NA 141 01/12/2009 1259   K 3.9 02/11/2013 1440   K 3.6 09/21/2012 1016   K 4.0 01/12/2009 1259   CL 100 02/11/2013 1440   CL 102 01/12/2009 1259   CO2 25 02/11/2013 1440   CO2 28 09/21/2012 1016   CO2 30 01/12/2009 1259   GLUCOSE 87 02/11/2013 1440   GLUCOSE 105 09/21/2012 1016   GLUCOSE 102 01/12/2009 1259   BUN 10 02/11/2013 1440   BUN 10.1 09/21/2012 1016   BUN 9 01/12/2009 1259   CREATININE 0.86 02/11/2013 1440   CREATININE 0.9 09/21/2012 1016   CREATININE 0.8 01/12/2009 1259   CALCIUM 9.5 02/11/2013 1440   CALCIUM 9.3 09/21/2012 1016  CALCIUM 9.2 01/12/2009 1259   GFRNONAA 87 02/11/2013 1440   GFRAA 100 02/11/2013 1440     ASSESSMENT AND PLAN Ventricular bigeminy These appear to be asymptomatic and cannot be treated with beta blockers secondary to Steven Krause.  Junctional Steven Krause It is quite challenging to distinguish atrial fibrillation with slow ventricular response from atrial standstill with a slow junctional escape rhythm. The only important reason to make the distinction would be related to anti-coagulation if he has atrial fibrillation. Judging by his CHADSVasc2 score, this may be less relevant at this time. His score is only 2 and the use of potent anticoagulants would probably to a higher increase in bleeding risk that it would reduce his risk of stroke. I have advised that he continue to treatment with aspirin for stroke prevention.  Followup in 6 months. He is instructed to call me sooner if he develops fatigue syncope or shortness of breath   Orders Placed This Encounter  Procedures  . EKG 12-Lead   No orders of the defined types were placed in this encounter.    Holli Humbles, MD, [place] [phone]  office [phone] pager

## 2013-03-14 NOTE — Assessment & Plan Note (Signed)
These appear to be asymptomatic and cannot be treated with beta blockers secondary to bradycardia.

## 2013-03-20 ENCOUNTER — Other Ambulatory Visit: Payer: Self-pay | Admitting: Internal Medicine

## 2013-04-08 ENCOUNTER — Ambulatory Visit: Payer: Medicare Other | Admitting: Internal Medicine

## 2013-05-12 ENCOUNTER — Other Ambulatory Visit: Payer: Self-pay | Admitting: Nurse Practitioner

## 2013-05-18 ENCOUNTER — Other Ambulatory Visit: Payer: Self-pay | Admitting: Nurse Practitioner

## 2013-05-27 ENCOUNTER — Ambulatory Visit (INDEPENDENT_AMBULATORY_CARE_PROVIDER_SITE_OTHER): Payer: Medicare Other | Admitting: Internal Medicine

## 2013-05-27 ENCOUNTER — Encounter: Payer: Self-pay | Admitting: Internal Medicine

## 2013-05-27 VITALS — BP 118/68 | HR 72 | Temp 96.3°F | Wt 194.0 lb

## 2013-05-27 DIAGNOSIS — F17200 Nicotine dependence, unspecified, uncomplicated: Secondary | ICD-10-CM

## 2013-05-27 DIAGNOSIS — E663 Overweight: Secondary | ICD-10-CM

## 2013-05-27 DIAGNOSIS — I498 Other specified cardiac arrhythmias: Secondary | ICD-10-CM

## 2013-05-27 DIAGNOSIS — R739 Hyperglycemia, unspecified: Secondary | ICD-10-CM

## 2013-05-27 DIAGNOSIS — R7309 Other abnormal glucose: Secondary | ICD-10-CM

## 2013-05-27 DIAGNOSIS — I499 Cardiac arrhythmia, unspecified: Principal | ICD-10-CM

## 2013-05-27 DIAGNOSIS — D751 Secondary polycythemia: Secondary | ICD-10-CM

## 2013-05-27 DIAGNOSIS — I10 Essential (primary) hypertension: Secondary | ICD-10-CM

## 2013-05-27 NOTE — Progress Notes (Signed)
Patient ID: Steven Krause, male   DOB: [birth year], 73 y.o.   MRN: [id number]   Location:  [facility] / [place]   No Known Allergies  Chief Complaint  Patient presents with  . Follow-up    2-3 month f/u and no refills needed today  . Immunizations    declines shingles vaccine    HPI: Patient is a 73 y.o. white male seen in the office today for f/u after seeing cardiology for arrhythmia.  Reviewed cardiology notes.  Seems he is not experiencing afib and would have only chads2vasc of 2 so anticoagulation not needed.  Continues to smoke his pipe.  Swelling in left leg resolved on its own.    Still does not want teeth pulled that are rotting.    Feeling well.  No pain.    Review of Systems:  Review of Systems  Constitutional: Negative for fever and malaise/fatigue.  HENT: Positive for hearing loss. Negative for congestion.   Eyes: Negative for blurred vision.  Respiratory: Negative for shortness of breath.   Cardiovascular: Negative for chest pain and leg swelling.  Gastrointestinal: Negative for abdominal pain and constipation.  Genitourinary:       Some difficulty passing urine  Musculoskeletal: Negative for falls and myalgias.  Neurological: Negative for dizziness, focal weakness and weakness.  Endo/Heme/Allergies: Bruises/bleeds easily.  Psychiatric/Behavioral: Negative for depression and memory loss. The patient does not have insomnia.      Past Medical History  Diagnosis Date  . Acquired polycythemia 01/05/2011  . Iron excess 01/05/2011  . Hypertension   . Allergy   . Memory loss   . Unspecified hearing loss   . Hypopotassemia   . Other specified cardiac dysrhythmias(427.89)   . Polycythemia, secondary   . 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 Unspecified disorder of kidney and ureter   . Tobacco use disorder   . Hernia of unspecified site of abdominal cavity without mention of obstruction or gangrene   . Unspecified disorder of skin  and subcutaneous tissue   . Dizziness and giddiness   . Diarrhea   . Abdominal pain, generalized   . Systemic hypertension     Past Surgical History  Procedure Laterality Date  . Cholecystectomy    . Ventral hernia repair    . Inflamed colon    . US echocardiography  11/03/09    EF 50-55%,  . Nm myocar perf wall motion  8//30/11    normal    Social History:   reports that he has been smoking Pipe.  He has never used smokeless tobacco. He reports that he does not drink alcohol or use illicit drugs.  Family History  Problem Relation Age of Onset  . Colon polyps Mother 17  . Colon cancer Mother   . Stomach cancer Neg Hx   . Rectal cancer Neg Hx   . Heart disease Father   . Diabetes Sister   . Cancer Brother   . Diabetes Sister   . Diabetes Sister   . Heart disease Sister     Medications: Patient's Medications  New Prescriptions   No medications on file  Previous Medications   AMLODIPINE (NORVASC) 10 MG TABLET    TAKE 1 TABLET BY MOUTH EVERY DAY   ASPIRIN 81 MG TABLET    Take 81 mg by mouth daily.     BISACODYL (DULCOLAX PO)    Take by mouth as needed.   FUROSEMIDE (LASIX) 40 MG TABLET    TAKE 1 TABLET  IN THE MORNING AND TAKE 1 TABLET IN THE EVENING   HYDROCODONE-ACETAMINOPHEN (NORCO/VICODIN) 5-325 MG PER TABLET    Take one tablet once a day for pain   SPIRONOLACTONE (ALDACTONE) 25 MG TABLET    Take 1 tablet (25 mg total) by mouth daily.   TAMSULOSIN (FLOMAX) 0.4 MG CAPS CAPSULE    TAKE ONE (1) CAPSULE BY MOUTH ONCE DAILY.  Modified Medications   No medications on file  Discontinued Medications   No medications on file     Physical Exam: Filed Vitals:   05/27/13 1528  BP: 118/68  Pulse: 72  Temp: 96.3 F (35.7 C)  TempSrc: Oral  Weight: 194 lb (87.998 kg)  SpO2: 98%  Physical Exam  Constitutional: He is oriented to person, place, and time. He appears well-developed and well-nourished. No distress.  Overweight white male  HENT:  Head: Normocephalic and  atraumatic.  Few remaining teeth are rotten and he has halitosis  Cardiovascular: Regular rhythm and normal heart sounds.   Pulmonary/Chest: Effort normal and breath sounds normal. No respiratory distress.  Abdominal: Soft. Bowel sounds are normal. He exhibits no distension. There is no tenderness. A hernia is present.  Musculoskeletal: Normal range of motion.  Neurological: He is alert and oriented to person, place, and time.  Skin: Skin is warm and dry.  Psychiatric: He has a normal mood and affect.    Labs reviewed: Basic Metabolic Panel:  Recent Labs  08/27/12 1542 09/21/12 1016 02/11/13 1440  NA 141 140 142  K 4.1 3.6 3.9  CL 99  --  100  CO2 26 28 25   GLUCOSE 90 105 87  BUN 9 10.1 10  CREATININE 0.94 0.9 0.86  CALCIUM 9.3 9.3 9.5   Liver Function Tests:  Recent Labs  08/27/12 1542 09/21/12 1016 02/11/13 1440  AST 40 37* 27  ALT 37 38 23  ALKPHOS 85 74 91  BILITOT 0.4 0.46 0.4  PROT 7.8 7.6 7.1  ALBUMIN  --  3.4*  --   CBC:  Recent Labs  08/27/12 1542 09/21/12 1016 02/11/13 1440  WBC 7.7 7.0 7.5  NEUTROABS 5.6 4.9 5.6  HGB 16.0 15.4 14.6  HCT 46.1 45.3 43.3  MCV 90 91.4 91  PLT  --  275  --     Lab Results  Component Value Date   HGBA1C 5.9* 02/11/2013   Assessment/Plan 1. Ventricular bigeminy -following with cardiology, beta blocker was stopped for bradycardia and doing fine -seems regular today  2. Tobacco use disorder -continues to smoke his pipe w/o any desire to quit despite my concerns about its effects on his health  3. Hypertension -bp at goal with current regimen, no changes  4. Hyperglycemia -f/u Hemoglobin A1c; Future  -encouraged healthy diet and exercise  5. Overweight (BMI 25.0-29.9) -encouraged healthy diet and exercise - Lipid panel; Future - Comprehensive metabolic panel; Future - TSH; Future - Hemoglobin A1c; Future  6. Polycythemia, secondary -keep f/u with hematology as planned -was jak-2 neg - CBC With  differential/Platelet; Future    Labs/tests ordered:   Orders Placed This Encounter  Procedures  . Lipid panel    Standing Status: Future     Number of Occurrences:      Standing Expiration Date: 11/27/2013    Order Specific Question:  Has the patient fasted?    Answer:  Yes  . Comprehensive metabolic panel    Standing Status: Future     Number of Occurrences:      Standing Expiration  Date: 11/27/2013    Order Specific Question:  Has the patient fasted?    Answer:  Yes  . CBC With differential/Platelet    Standing Status: Future     Number of Occurrences:      Standing Expiration Date: 11/27/2013  . TSH    Standing Status: Future     Number of Occurrences:      Standing Expiration Date: 11/27/2013  . Hemoglobin A1c    Standing Status: Future     Number of Occurrences:      Standing Expiration Date: 11/27/2013    Next appt:  3 mos labs before

## 2013-06-10 ENCOUNTER — Other Ambulatory Visit: Payer: Self-pay | Admitting: Internal Medicine

## 2013-07-02 ENCOUNTER — Other Ambulatory Visit: Payer: Self-pay | Admitting: Internal Medicine

## 2013-08-29 ENCOUNTER — Other Ambulatory Visit: Payer: Medicare Other

## 2013-08-29 DIAGNOSIS — E663 Overweight: Secondary | ICD-10-CM

## 2013-08-29 DIAGNOSIS — D751 Secondary polycythemia: Secondary | ICD-10-CM

## 2013-08-29 DIAGNOSIS — R739 Hyperglycemia, unspecified: Secondary | ICD-10-CM

## 2013-08-30 ENCOUNTER — Encounter: Payer: Self-pay | Admitting: *Deleted

## 2013-08-30 LAB — CBC WITH DIFFERENTIAL
Basophils Absolute: 0 10*3/uL (ref 0.0–0.2)
Basos: 1 %
Eos: 2 %
Eosinophils Absolute: 0.1 10*3/uL (ref 0.0–0.4)
HCT: 47.1 % (ref 37.5–51.0)
Hemoglobin: 15.9 g/dL (ref 12.6–17.7)
Immature Grans (Abs): 0 10*3/uL (ref 0.0–0.1)
Immature Granulocytes: 0 %
Lymphocytes Absolute: 1.3 10*3/uL (ref 0.7–3.1)
Lymphs: 21 %
MCH: 31.3 pg (ref 26.6–33.0)
MCHC: 33.8 g/dL (ref 31.5–35.7)
MCV: 93 fL (ref 79–97)
Monocytes Absolute: 0.5 10*3/uL (ref 0.1–0.9)
Monocytes: 7 %
Neutrophils Absolute: 4.3 10*3/uL (ref 1.4–7.0)
Neutrophils Relative %: 69 %
Platelets: 261 10*3/uL (ref 150–379)
RBC: 5.08 x10E6/uL (ref 4.14–5.80)
RDW: 13.6 % (ref 12.3–15.4)
WBC: 6.2 10*3/uL (ref 3.4–10.8)

## 2013-08-30 LAB — COMPREHENSIVE METABOLIC PANEL
ALT: 31 IU/L (ref 0–44)
AST: 34 IU/L (ref 0–40)
Albumin/Globulin Ratio: 1.5 (ref 1.1–2.5)
Albumin: 4.3 g/dL (ref 3.5–4.8)
Alkaline Phosphatase: 76 IU/L (ref 39–117)
BUN/Creatinine Ratio: 12 (ref 10–22)
BUN: 12 mg/dL (ref 8–27)
CO2: 23 mmol/L (ref 18–29)
Calcium: 9.4 mg/dL (ref 8.6–10.2)
Chloride: 97 mmol/L (ref 97–108)
Creatinine, Ser: 1.01 mg/dL (ref 0.76–1.27)
GFR calc Af Amer: 86 mL/min/{1.73_m2} (ref >59)
GFR calc non Af Amer: 74 mL/min/{1.73_m2} (ref >59)
Globulin, Total: 2.9 g/dL (ref 1.5–4.5)
Glucose: 101 mg/dL — ABNORMAL HIGH (ref 65–99)
Potassium: 4.2 mmol/L (ref 3.5–5.2)
Sodium: 140 mmol/L (ref 134–144)
Total Bilirubin: 0.5 mg/dL (ref 0.0–1.2)
Total Protein: 7.2 g/dL (ref 6.0–8.5)

## 2013-08-30 LAB — LIPID PANEL
Chol/HDL Ratio: 3.5 ratio units (ref 0.0–5.0)
Cholesterol, Total: 187 mg/dL (ref 100–199)
HDL: 54 mg/dL (ref >39)
LDL Calculated: 106 mg/dL — ABNORMAL HIGH (ref 0–99)
Triglycerides: 135 mg/dL (ref 0–149)
VLDL Cholesterol Cal: 27 mg/dL (ref 5–40)

## 2013-08-30 LAB — HEMOGLOBIN A1C
Est. average glucose Bld gHb Est-mCnc: 126 mg/dL
Hgb A1c MFr Bld: 6 % — ABNORMAL HIGH (ref 4.8–5.6)

## 2013-08-30 LAB — TSH: TSH: 1.68 u[IU]/mL (ref 0.450–4.500)

## 2013-09-02 ENCOUNTER — Encounter: Payer: Self-pay | Admitting: Internal Medicine

## 2013-09-02 ENCOUNTER — Telehealth: Payer: Self-pay | Admitting: Hematology and Oncology

## 2013-09-02 ENCOUNTER — Ambulatory Visit (INDEPENDENT_AMBULATORY_CARE_PROVIDER_SITE_OTHER): Payer: Medicare Other | Admitting: Internal Medicine

## 2013-09-02 VITALS — BP 134/82 | HR 61 | Temp 97.5°F | Resp 14 | Ht 69.0 in | Wt 195.8 lb

## 2013-09-02 DIAGNOSIS — R7309 Other abnormal glucose: Secondary | ICD-10-CM

## 2013-09-02 DIAGNOSIS — E669 Obesity, unspecified: Secondary | ICD-10-CM

## 2013-09-02 DIAGNOSIS — I10 Essential (primary) hypertension: Secondary | ICD-10-CM

## 2013-09-02 DIAGNOSIS — R3911 Hesitancy of micturition: Secondary | ICD-10-CM

## 2013-09-02 DIAGNOSIS — E785 Hyperlipidemia, unspecified: Secondary | ICD-10-CM

## 2013-09-02 DIAGNOSIS — D751 Secondary polycythemia: Secondary | ICD-10-CM

## 2013-09-02 DIAGNOSIS — F17200 Nicotine dependence, unspecified, uncomplicated: Secondary | ICD-10-CM

## 2013-09-02 DIAGNOSIS — R739 Hyperglycemia, unspecified: Secondary | ICD-10-CM

## 2013-09-02 NOTE — Telephone Encounter (Signed)
, °

## 2013-09-02 NOTE — Patient Instructions (Signed)
Cut back on your sweets and starchy foods like bread, biscuits, potatoes, fried foods that can increase your cholesterol and sugar.

## 2013-09-02 NOTE — Progress Notes (Signed)
Patient ID: Steven Krause, male   DOB: 08/22/1940, 73 y.o.   MRN: [id number]   Location:  [facility] / [facility]  Code Status: Full code  No Known Allergies  Chief Complaint  Patient presents with  . Medical Management of Chronic Issues    To follow up BP, Weight and Hyperglycemia    HPI: Patient is a 73 y.o. white male seen in the office today for medical mgt of chronic diseases.  We spent today's visit focusing on his diet and exercise.  He does walk quite a bit each day.  Admits to eating more sweets and starches than he should and we reviewed cutting back on these due to hyperglycemia and high cholesterol.  He is not ready to quit smoking.  His legs aren't swelling anymore--he elevates them at rest.  Review of Systems:  Review of Systems  Constitutional: Negative for weight loss.  HENT: Negative for congestion.        Needs teeth pulled, but refuses dental care  Eyes: Negative for blurred vision.  Respiratory: Positive for shortness of breath. Negative for cough, hemoptysis and sputum production.        Continues to smoke pipe  Cardiovascular: Negative for chest pain and leg swelling.       Swelling improved  Gastrointestinal: Negative for abdominal pain.  Genitourinary: Negative for dysuria, urgency and frequency.       Difficulty starting stream, but improved with flomax  Musculoskeletal: Negative for falls and myalgias.  Skin: Negative for rash.  Neurological: Negative for dizziness.  Psychiatric/Behavioral: Negative for depression and memory loss.     Past Medical History  Diagnosis Date  . Acquired polycythemia 01/05/2011  . Iron excess 01/05/2011  . Hypertension   . Allergy   . Memory loss   . Unspecified hearing loss   . Hypopotassemia   . Other specified cardiac dysrhythmias(427.89)   . Polycythemia, secondary   . 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 Unspecified disorder of kidney and ureter   . Tobacco use disorder   . Hernia of  unspecified site of abdominal cavity without mention of obstruction or gangrene   . Unspecified disorder of skin and subcutaneous tissue   . Dizziness and giddiness   . Diarrhea   . Abdominal pain, generalized   . Systemic hypertension     Past Surgical History  Procedure Laterality Date  . Cholecystectomy    . Ventral hernia repair    . Inflamed colon    . US echocardiography  11/03/09    EF 50-55%,  . Nm myocar perf wall motion  8//30/11    normal    Social History:   reports that he has been smoking Pipe.  He has never used smokeless tobacco. He reports that he does not drink alcohol or use illicit drugs.  Family History  Problem Relation Age of Onset  . Colon polyps Mother 92  . Colon cancer Mother   . Stomach cancer Neg Hx   . Rectal cancer Neg Hx   . Heart disease Father   . Diabetes Sister   . Cancer Brother   . Diabetes Sister   . Diabetes Sister   . Heart disease Sister     Medications: Patient's Medications  New Prescriptions   No medications on file  Previous Medications   AMLODIPINE (NORVASC) 10 MG TABLET    TAKE 1 TABLET BY MOUTH EVERY DAY   ASPIRIN 81 MG TABLET    Take 81 mg by  mouth daily.     BISACODYL (DULCOLAX PO)    Take by mouth as needed.   FUROSEMIDE (LASIX) 40 MG TABLET    TAKE 1 TABLET IN THE MORNING AND TAKE 1 TABLET IN THE EVENING   HYDROCODONE-ACETAMINOPHEN (NORCO/VICODIN) 5-325 MG PER TABLET    Take one tablet once a day for pain   SPIRONOLACTONE (ALDACTONE) 25 MG TABLET    TAKE 1 TABLET EVERY DAY   TAMSULOSIN (FLOMAX) 0.4 MG CAPS CAPSULE    TAKE ONE (1) CAPSULE BY MOUTH ONCE DAILY.  Modified Medications   No medications on file  Discontinued Medications   No medications on file     Physical Exam: Filed Vitals:   09/02/13 1333  BP: 134/82  Pulse: 61  Temp: 97.5 F (36.4 C)  TempSrc: Oral  Resp: 14  Height: 5\' 9"  (1.753 m)  Weight: 195 lb 12.8 oz (88.814 kg)  Physical Exam  Constitutional: He is oriented to person, place,  and time. He appears well-developed and well-nourished. No distress.  HENT:  Head: Normocephalic and atraumatic.  Only has a few remaining decayed teeth  Cardiovascular: Normal rate, regular rhythm, normal heart sounds and intact distal pulses.   Pulmonary/Chest: Effort normal.  Some scattered rhonchi  Abdominal: Soft. Bowel sounds are normal. He exhibits no distension. There is no tenderness.  2 large ventral hernias, reducible present  Musculoskeletal: Normal range of motion. He exhibits no edema and no tenderness.  Neurological: He is alert and oriented to person, place, and time.  Skin: Skin is warm and dry.  Purplish pink skin discoloration  Psychiatric: He has a normal mood and affect.     Labs reviewed: Basic Metabolic Panel:  Recent Labs  09/21/12 1016 02/11/13 1440 08/29/13 0815  NA 140 142 140  K 3.6 3.9 4.2  CL  --  100 97  CO2 28 25 23   GLUCOSE 105 87 101*  BUN 10.1 10 12   CREATININE 0.9 0.86 1.01  CALCIUM 9.3 9.5 9.4  TSH  --   --  1.680   Liver Function Tests:  Recent Labs  09/21/12 1016 02/11/13 1440 08/29/13 0815  AST 37* 27 34  ALT 38 23 31  ALKPHOS 74 91 76  BILITOT 0.46 0.4 0.5  PROT 7.6 7.1 7.2  ALBUMIN 3.4*  --   --   CBC:  Recent Labs  09/21/12 1016 02/11/13 1440 08/29/13 0815  WBC 7.0 7.5 6.2  NEUTROABS 4.9 5.6 4.3  HGB 15.4 14.6 15.9  HCT 45.3 43.3 47.1  MCV 91.4 91 93  PLT 275  --  261   Lipid Panel:  Recent Labs  08/29/13 0815  HDL 54  LDLCALC 106*  TRIG 135  CHOLHDL 3.5   Lab Results  Component Value Date   HGBA1C 6.0* 08/29/2013   Assessment/Plan 1. Obesity, unspecified - encouraged a healthy diet today--reviewed what he should and should not eat and given information in instructions  2. Urinary hesitancy -improved with flomax use - Basic metabolic panel; Future  3. Tobacco use disorder -continues to smoke pipe, unwilling to quit--seems to think that the smoke somehow makes his rotting teeth feel  better  4. Essential hypertension -bp improved today vs. Prior visits with amlodipine, lasix, aldactone use  5. Polycythemia, secondary - has been stable, cont baby asa - CBC With differential/Platelet; Future  6. Hyperglycemia - encouraged low starch and low sweets diet - Hemoglobin A1c; Future  7. Hyperlipidemia LDL goal <100 - -if lipids are not improving by  next visit, may need to try a different statin (not on one at all now--stopped due to muscle pain previously) -Lipid panel; Future - CBC With differential/Platelet; Future - Basic metabolic panel; Future  Labs/tests ordered: Orders Placed This Encounter  Procedures  . Hemoglobin A1c    Standing Status: Future     Number of Occurrences:      Standing Expiration Date: 05/05/2014  . Lipid panel    Standing Status: Future     Number of Occurrences:      Standing Expiration Date: 05/05/2014    Order Specific Question:  Has the patient fasted?    Answer:  Yes  . CBC With differential/Platelet    Standing Status: Future     Number of Occurrences:      Standing Expiration Date: 05/05/2014  . Basic metabolic panel    Standing Status: Future     Number of Occurrences:      Standing Expiration Date: 05/05/2014    Order Specific Question:  Has the patient fasted?    Answer:  Yes    Next appt:  4 mos, labs before

## 2013-09-23 ENCOUNTER — Encounter: Payer: Self-pay | Admitting: Hematology and Oncology

## 2013-09-23 ENCOUNTER — Other Ambulatory Visit: Payer: Self-pay | Admitting: Internal Medicine

## 2013-09-23 ENCOUNTER — Ambulatory Visit (HOSPITAL_BASED_OUTPATIENT_CLINIC_OR_DEPARTMENT_OTHER): Payer: Medicare Other | Admitting: Hematology and Oncology

## 2013-09-23 ENCOUNTER — Ambulatory Visit: Payer: Medicaid Other | Admitting: Oncology

## 2013-09-23 ENCOUNTER — Other Ambulatory Visit: Payer: Medicare Other

## 2013-09-23 DIAGNOSIS — D751 Secondary polycythemia: Secondary | ICD-10-CM

## 2013-09-23 DIAGNOSIS — Z7982 Long term (current) use of aspirin: Secondary | ICD-10-CM

## 2013-09-23 DIAGNOSIS — F17200 Nicotine dependence, unspecified, uncomplicated: Secondary | ICD-10-CM

## 2013-09-23 NOTE — Assessment & Plan Note (Signed)
This is due to a chronic smoking. His last hemoglobin was within acceptable limits. I recommend the patient to quit using tobacco. He will continue on aspirin. I have not make return appointment for him to come back. He does not require phlebotomy unless his hemoglobin is greater than 16 or hematocrit is greater than 50.

## 2013-09-23 NOTE — Assessment & Plan Note (Signed)
I spent some time counseling the patient the importance of tobacco cessation. He is not interested to quit using tobacco.

## 2013-09-23 NOTE — Progress Notes (Signed)
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 male was transferred to my care after his prior physician has left.  I reviewed the patient's records extensive and collaborated the history with the patient. Summary of his history is as follows: This patient was noted to have chronic erythrocytosis from tobacco use. Prior testing for myeloproliferative disorder with JAK2 mutation and serum erythropoietin level were normal. He had multiple phlebotomies sessions and was being observed. He denies diagnosis of blood clot or stroke. He has no symptoms. MEDICAL HISTORY:  Past Medical History  Diagnosis Date  . Acquired polycythemia 01/05/2011  . Iron excess 01/05/2011  . Hypertension   . Allergy   . Memory loss   . Unspecified hearing loss   . Hypopotassemia   . Other specified cardiac dysrhythmias(427.89)   . Polycythemia, secondary   . 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 Unspecified disorder of kidney and ureter   . Tobacco use disorder   . Hernia of unspecified site of abdominal cavity without mention of obstruction or gangrene   . Unspecified disorder of skin and subcutaneous tissue   . Dizziness and giddiness   . Diarrhea   . Abdominal pain, generalized   . Systemic hypertension     SURGICAL HISTORY: Past Surgical History  Procedure Laterality Date  . Cholecystectomy    . Ventral hernia repair    . Inflamed colon    . US echocardiography  11/03/09    EF 50-55%,  . Nm myocar perf wall motion  8//30/11    normal    SOCIAL HISTORY: History   Social History  . Marital Status: Single    Spouse Name: N/A    Number of Children: N/A  . Years of Education: N/A   Occupational History  . Not on file.   Social History Main Topics  . Smoking status:  Current Every Day Smoker -- 0.25 packs/day for 55 years    Types: Pipe  . Smokeless tobacco: Never Used  . Alcohol Use: No  . Drug Use: No  . Sexual Activity: Not on file   Other Topics Concern  . Not on file   Social History Narrative  . No narrative on file    FAMILY HISTORY: Family History  Problem Relation Age of Onset  . Colon polyps Mother 68  . Colon cancer Mother   . Stomach cancer Neg Hx   . Rectal cancer Neg Hx   . Heart disease Father   . Diabetes Sister   . Cancer Brother   . Diabetes Sister   . Diabetes Sister   . Heart disease Sister     ALLERGIES:  has No Known Allergies.  MEDICATIONS:  Current Outpatient Prescriptions  Medication Sig Dispense Refill  . amLODipine (NORVASC) 10 MG tablet TAKE 1 TABLET BY MOUTH EVERY DAY  30 tablet  5  . aspirin 81 MG tablet Take 81 mg by mouth daily.        . Bisacodyl (DULCOLAX PO) Take by mouth as needed.      . furosemide (LASIX) 40 MG tablet TAKE 1 TABLET IN THE MORNING AND TAKE 1 TABLET IN THE EVENING  60 tablet  5  . spironolactone (ALDACTONE) 25 MG tablet TAKE 1 TABLET EVERY DAY  30 tablet  5  . tamsulosin (FLOMAX) 0.4 MG  CAPS capsule TAKE ONE (1) CAPSULE BY MOUTH ONCE DAILY.  30 capsule  5  . HYDROcodone-acetaminophen (NORCO/VICODIN) 5-325 MG per tablet Take one tablet once a day for pain  60 tablet  5   No current facility-administered medications for this visit.    REVIEW OF SYSTEMS:   Constitutional: Denies fevers, chills or abnormal night sweats Eyes: Denies blurriness of vision, double vision or watery eyes Ears, nose, mouth, throat, and face: Denies mucositis or sore throat Respiratory: Denies cough, dyspnea or wheezes Cardiovascular: Denies palpitation, chest discomfort or lower extremity swelling Gastrointestinal:  Denies nausea, heartburn or change in bowel habits Skin: Denies abnormal skin rashes Lymphatics: Denies new lymphadenopathy or easy bruising Neurological:Denies numbness, tingling or new  weaknesses Behavioral/Psych: Mood is stable, no new changes  All other systems were reviewed with the patient and are negative.  PHYSICAL EXAMINATION: ECOG PERFORMANCE STATUS: 0 - Asymptomatic  Filed Vitals:   09/23/13 1431  BP: 141/78  Pulse: 59  Temp: 98 F (36.7 C)  Resp: 18   Filed Weights   09/23/13 1431  Weight: 196 lb 8 oz (89.132 kg)    GENERAL:alert, no distress and comfortable SKIN: skin color is plethoric, texture, turgor are normal, no rashes or significant lesions EYES: normal, conjunctiva are pink and non-injected, sclera clear OROPHARYNX:no exudate, normal lips, buccal mucosa, and tongue. Very poor dentition is noted NECK: supple, thyroid normal size, non-tender, without nodularity LYMPH:  no palpable lymphadenopathy in the cervical, axillary or inguinal LUNGS: clear to auscultation and percussion with normal breathing effort HEART: regular rate & rhythm and no murmurs without lower extremity edema ABDOMEN:abdomen soft, non-tender and normal bowel sounds Musculoskeletal:no cyanosis of digits and no clubbing  PSYCH: alert & oriented x 3 with fluent speech NEURO: no focal motor/sensory deficits  LABORATORY DATA:  I have reviewed the data as listed Lab Results  Component Value Date   WBC 6.2 08/29/2013   HGB 15.9 08/29/2013   HCT 47.1 08/29/2013   MCV 93 08/29/2013   PLT 261 08/29/2013    Recent Labs  02/11/13 1440 08/29/13 0815  NA 142 140  K 3.9 4.2  CL 100 97  CO2 25 23  GLUCOSE 87 101*  BUN 10 12  CREATININE 0.86 1.01  CALCIUM 9.5 9.4  GFRNONAA 87 74  GFRAA 100 86  PROT 7.1 7.2  AST 27 34  ALT 23 31  ALKPHOS 91 76  BILITOT 0.4 0.5    ASSESSMENT & PLAN:  Polycythemia, secondary This is due to a chronic smoking. His last hemoglobin was within acceptable limits. I recommend the patient to quit using tobacco. He will continue on aspirin. I have not make return appointment for him to come back. He does not require phlebotomy unless his  hemoglobin is greater than 16 or hematocrit is greater than 50.   Tobacco use disorder I spent some time counseling the patient the importance of tobacco cessation. He is not interested to quit using tobacco.   Orders Placed This Encounter  Procedures  . CBC with Differential    Standing Status: Future     Number of Occurrences:      Standing Expiration Date: 09/23/2014  . Comprehensive metabolic panel (Cmet) - [facility]    Standing Status: Future     Number of Occurrences:      Standing Expiration Date: 09/23/2014    All questions were answered. The patient knows to call the clinic with any problems, questions or concerns. I spent 15 minutes counseling the  patient face to face. The total time spent in the appointment was 20 minutes and more than 50% was on counseling.     [facility], [place], MD 09/23/2013 2:42 PM

## 2013-10-07 ENCOUNTER — Telehealth: Payer: Self-pay | Admitting: Cardiovascular Disease

## 2013-10-07 ENCOUNTER — Encounter: Payer: Self-pay | Admitting: Cardiovascular Disease

## 2013-10-07 ENCOUNTER — Ambulatory Visit (INDEPENDENT_AMBULATORY_CARE_PROVIDER_SITE_OTHER): Payer: Medicare Other | Admitting: Cardiovascular Disease

## 2013-10-07 VITALS — BP 122/78 | HR 58 | Ht 69.0 in | Wt 195.4 lb

## 2013-10-07 DIAGNOSIS — R0789 Other chest pain: Secondary | ICD-10-CM

## 2013-10-07 DIAGNOSIS — I4891 Unspecified atrial fibrillation: Secondary | ICD-10-CM

## 2013-10-07 NOTE — Progress Notes (Signed)
Patient ID: Steven Krause, male   DOB: 03/01/1941, 73 y.o.   MRN: [id number]     Reason for office visit Bradycardia  Patient presents for follow-up of bradycardia. Patient notes he has been doing well since his last visit. He notes he vomited this morning x1. He had some loose stools with taking a laxative over the weekend. Though he reports that he has been doing well since this morning. He does note some "gas pains". States when he belches the pain goes away. This occurs in his epigastric region. Will bend forward and belch and the pain will go away. No chest pain, shortness of breath, edema, palpitations. States wouldn't run up a flight of stairs due to legs. No trouble walking to mail box. No dizziness. No issues with physical activity. No orthopnea or PND. No cough.   No stroke, diabetes, or heart failure issues. No history of bleeding. Quit smoking on Saturday.    No Known Allergies  Current Outpatient Prescriptions  Medication Sig Dispense Refill  . amLODipine (NORVASC) 10 MG tablet TAKE 1 TABLET BY MOUTH EVERY DAY  30 tablet  5  . aspirin 81 MG tablet Take 81 mg by mouth daily.        . Bisacodyl (DULCOLAX PO) Take by mouth as needed.      . furosemide (LASIX) 40 MG tablet TAKE 1 TABLET IN THE MORNING AND TAKE 1 TABLET IN THE EVENING  60 tablet  5  . HYDROcodone-acetaminophen (NORCO/VICODIN) 5-325 MG per tablet Take one tablet once a day for pain  60 tablet  5  . spironolactone (ALDACTONE) 25 MG tablet TAKE 1 TABLET EVERY DAY  30 tablet  5  . tamsulosin (FLOMAX) 0.4 MG CAPS capsule TAKE ONE (1) CAPSULE BY MOUTH ONCE DAILY.  30 capsule  5   No current facility-administered medications for this visit.    Past Medical History  Diagnosis Date  . Acquired polycythemia 01/05/2011  . Iron excess 01/05/2011  . Hypertension   . Allergy   . Memory loss   . Unspecified hearing loss   . Hypopotassemia   . Other specified cardiac dysrhythmias(427.89)   . Polycythemia, secondary     . 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 Unspecified disorder of kidney and ureter   . Tobacco use disorder   . Hernia of unspecified site of abdominal cavity without mention of obstruction or gangrene   . Unspecified disorder of skin and subcutaneous tissue   . Dizziness and giddiness   . Diarrhea   . Abdominal pain, generalized   . Systemic hypertension     Past Surgical History  Procedure Laterality Date  . Cholecystectomy    . Ventral hernia repair    . Inflamed colon    . US echocardiography  11/03/09    EF 50-55%,  . Nm myocar perf wall motion  8//30/11    normal    Family History  Problem Relation Age of Onset  . Colon polyps Mother 4  . Colon cancer Mother   . Stomach cancer Neg Hx   . Rectal cancer Neg Hx   . Heart disease Father   . Diabetes Sister   . Cancer Brother   . Diabetes Sister   . Diabetes Sister   . Heart disease Sister     History   Social History  . Marital Status: Single    Spouse Name: N/A    Number of Children: N/A  . Years of Education: N/A   Occupational History  .  Not on file.   Social History Main Topics  . Smoking status: Current Every Day Smoker -- 0.25 packs/day for 55 years    Types: Pipe  . Smokeless tobacco: Never Used  . Alcohol Use: No  . Drug Use: No  . Sexual Activity: Not on file   Other Topics Concern  . Not on file   Social History Narrative  . No narrative on file    Review of systems: The patient denies chest pain at rest or with exertion, dyspnea at rest or with exertion, orthopnea, PND, syncope, palpitations, cough.  PHYSICAL EXAM BP 122/78  Pulse 58  Ht 5\' 9"  (1.753 m)  Wt 195 lb 6.4 oz (88.633 kg)  BMI 28.84 kg/m2 General: Alert, oriented x3, no distress  Head: no evidence of trauma, PERRL, EOMI; normal ears, nose and oropharynx; poor dentition with multiple caries and missing teeth  Neck: normal jugular venous pulsations and no hepatojugular reflux; brisk carotid pulses without delay and no carotid  bruits  Chest: clear to auscultation, symmetrical and full respiratory excursions  Cardiovascular: irregularly irregular rhythm, normal first and second heart sounds, no murmurs, rubs or gallops  Abdomen: midline ventral hernia present, no tenderness or distention, no masses by palpation, normal bowel sounds, no hepatosplenomegaly  Extremities: no clubbing, cyanosis; 2+ radial Neurological: grossly nonfocal    EKG: no visible P waves, Atrial fibrillation with slow ventricular response.  Lipid Panel     Component Value Date/Time   TRIG 135 08/29/2013 0815   HDL 54 08/29/2013 0815   CHOLHDL 3.5 08/29/2013 0815   LDLCALC 106* 08/29/2013 0815    BMET    Component Value Date/Time   NA 140 08/29/2013 0815   NA 140 09/21/2012 1016   NA 141 01/12/2009 1259   K 4.2 08/29/2013 0815   K 3.6 09/21/2012 1016   K 4.0 01/12/2009 1259   CL 97 08/29/2013 0815   CL 102 01/12/2009 1259   CO2 23 08/29/2013 0815   CO2 28 09/21/2012 1016   CO2 30 01/12/2009 1259   GLUCOSE 101* 08/29/2013 0815   GLUCOSE 105 09/21/2012 1016   GLUCOSE 102 01/12/2009 1259   BUN 12 08/29/2013 0815   BUN 10.1 09/21/2012 1016   BUN 9 01/12/2009 1259   CREATININE 1.01 08/29/2013 0815   CREATININE 0.9 09/21/2012 1016   CREATININE 0.8 01/12/2009 1259   CALCIUM 9.4 08/29/2013 0815   CALCIUM 9.3 09/21/2012 1016   CALCIUM 9.2 01/12/2009 1259   GFRNONAA 74 08/29/2013 0815   GFRAA 86 08/29/2013 0815     ASSESSMENT AND PLAN Atrial fibrillation with slow ventricular response EKG on today's evaluation is convincing for atrial fibrillation with slow ventricular response. Rate of 58 today on the EKG. He is asymptomatic at this time. His current CHADS2 score is 1 and his CHADS-VASC score is 2. At this time he is to continue his aspirin for clot protection. We discussed that if he were to have any signs or symptoms of stroke this would change the thinking behind his anti-coagulation. We discussed that he is to contact the office if he develops any  changes in vision, weakness, numbness, facial droop, change in speech, or any other neurological deficits.    Patient will f/u in 6 months.  Orders Placed This Encounter  Procedures  . EKG 12-Lead   No orders of the defined types were placed in this encounter.    Tommi Rumps, MD [facility] PGY-2  Sanda Klein, MD, [place] (  [phone] office [phone] pager  I have seen and examined the patient along with Tommi Rumps, MD.  I have reviewed the chart, notes and new data.  I agree with his note.  Key new complaints: asymptomatic Key examination changes: irregular rhythm Key new findings / data: ECG is now convincingly atrial fibrillation with spontaneously controlled rate  PLAN: CHADS score 1 CHADSVasc score 2 Full anticoagulation - number needed to harm by bleeding 1 in 61 versus number needed to prevent stroke 1 in 100.  Indication for anticoagulation is borderline, but will increase as he gets older. For time being will continue aspirin. Very low threshold to switch to an anticoagulant if he should have even the shortest/mildest TIA.  Sanda Klein, MD, [address] [phone] 10/07/2013, 5:14 PM

## 2013-10-07 NOTE — Patient Instructions (Addendum)
Please call your doctor if you develop weakness, numbness, drooping of the face, change in vision, change in speech, or tingling.  Dr. Sallyanne Kuster recommends that you schedule a follow-up appointment in: 6 MONTHS.

## 2013-10-07 NOTE — Telephone Encounter (Signed)
Questions regarding a[ppt today answered.

## 2013-10-07 NOTE — Telephone Encounter (Signed)
Please call today if possiblel. Pt was here today,she wants to know what the pt was told by Dr C today.

## 2013-10-07 NOTE — Assessment & Plan Note (Addendum)
EKG on today's evaluation is convincing for atrial fibrillation with slow ventricular response. Rate of 58 today on the EKG. He is asymptomatic at this time. His current CHADS2 score is 1 and his CHADS-VASC score is 2. At this time he is to continue his aspirin for clot protection. We discussed that if he were to have any signs or symptoms of stroke this would change the thinking behind his anti-coagulation. We discussed that he is to contact the office if he develops any changes in vision, weakness, numbness, facial droop, change in speech, or any other neurological deficits.

## 2013-12-04 ENCOUNTER — Encounter: Payer: Self-pay | Admitting: Internal Medicine

## 2013-12-15 ENCOUNTER — Encounter (HOSPITAL_COMMUNITY): Payer: Self-pay | Admitting: Emergency Medicine

## 2013-12-15 ENCOUNTER — Emergency Department (HOSPITAL_COMMUNITY)
Admission: EM | Admit: 2013-12-15 | Discharge: 2013-12-15 | Discharge status: Against Medical Advice | Payer: Medicare Other | Attending: Emergency Medicine | Admitting: Emergency Medicine

## 2013-12-15 DIAGNOSIS — H9190 Unspecified hearing loss, unspecified ear: Secondary | ICD-10-CM | POA: Diagnosis not present

## 2013-12-15 DIAGNOSIS — K5900 Constipation, unspecified: Secondary | ICD-10-CM | POA: Insufficient documentation

## 2013-12-15 DIAGNOSIS — R1110 Vomiting, unspecified: Secondary | ICD-10-CM | POA: Diagnosis not present

## 2013-12-15 DIAGNOSIS — I10 Essential (primary) hypertension: Secondary | ICD-10-CM | POA: Insufficient documentation

## 2013-12-15 DIAGNOSIS — Z720 Tobacco use: Secondary | ICD-10-CM | POA: Diagnosis not present

## 2013-12-15 LAB — COMPREHENSIVE METABOLIC PANEL
ALT: 45 U/L (ref 0–53)
ANION GAP: 17 — AB (ref 5–15)
AST: 46 U/L — ABNORMAL HIGH (ref 0–37)
Albumin: 3.7 g/dL (ref 3.5–5.2)
Alkaline Phosphatase: 86 U/L (ref 39–117)
BUN: 15 mg/dL (ref 6–23)
CO2: 25 mEq/L (ref 19–32)
Calcium: 9.8 mg/dL (ref 8.4–10.5)
Chloride: 95 mEq/L — ABNORMAL LOW (ref 96–112)
Creatinine, Ser: 0.84 mg/dL (ref 0.50–1.35)
GFR calc non Af Amer: 85 mL/min — ABNORMAL LOW (ref >90)
GLUCOSE: 150 mg/dL — AB (ref 70–99)
Potassium: 4 mEq/L (ref 3.7–5.3)
SODIUM: 137 meq/L (ref 137–147)
TOTAL PROTEIN: 8.9 g/dL — AB (ref 6.0–8.3)
Total Bilirubin: 0.8 mg/dL (ref 0.3–1.2)

## 2013-12-15 LAB — CBC WITH DIFFERENTIAL/PLATELET
BASOS PCT: 0 % (ref 0–1)
Basophils Absolute: 0 10*3/uL (ref 0.0–0.1)
EOS ABS: 0 10*3/uL (ref 0.0–0.7)
Eosinophils Relative: 0 % (ref 0–5)
HCT: 50.1 % (ref 39.0–52.0)
Hemoglobin: 17.7 g/dL — ABNORMAL HIGH (ref 13.0–17.0)
Lymphocytes Relative: 7 % — ABNORMAL LOW (ref 12–46)
Lymphs Abs: 0.9 10*3/uL (ref 0.7–4.0)
MCH: 31.7 pg (ref 26.0–34.0)
MCHC: 35.3 g/dL (ref 30.0–36.0)
MCV: 89.6 fL (ref 78.0–100.0)
MONOS PCT: 4 % (ref 3–12)
Monocytes Absolute: 0.5 10*3/uL (ref 0.1–1.0)
Neutro Abs: 11 10*3/uL — ABNORMAL HIGH (ref 1.7–7.7)
Neutrophils Relative %: 89 % — ABNORMAL HIGH (ref 43–77)
PLATELETS: 320 10*3/uL (ref 150–400)
RBC: 5.59 MIL/uL (ref 4.22–5.81)
RDW: 13 % (ref 11.5–15.5)
WBC: 12.4 10*3/uL — ABNORMAL HIGH (ref 4.0–10.5)

## 2013-12-15 LAB — LIPASE, BLOOD: Lipase: 30 U/L (ref 11–59)

## 2013-12-15 NOTE — ED Notes (Signed)
Pt did not respond for reassessment. Pager and bp cuff found in waiting area.

## 2013-12-15 NOTE — ED Notes (Signed)
Pt c/o vomiting and constipation onset Friday. Pt reports last BM was yesterday and runny. Pt presents with distention to abdomen, reports 'I have hernias"

## 2013-12-16 ENCOUNTER — Encounter: Payer: Self-pay | Admitting: Nurse Practitioner

## 2013-12-16 ENCOUNTER — Ambulatory Visit (INDEPENDENT_AMBULATORY_CARE_PROVIDER_SITE_OTHER): Payer: Medicare Other | Admitting: Nurse Practitioner

## 2013-12-16 ENCOUNTER — Emergency Department
Admit: 2013-12-16 | Discharge: 2013-12-16 | Disposition: A | Discharge status: Home / Self Care | Payer: Medicare Other | Attending: Nurse Practitioner | Admitting: Nurse Practitioner

## 2013-12-16 VITALS — BP 138/86 | HR 62 | Temp 97.5°F | Resp 10 | Ht 68.0 in | Wt 190.0 lb

## 2013-12-16 DIAGNOSIS — R05 Cough: Secondary | ICD-10-CM

## 2013-12-16 DIAGNOSIS — R300 Dysuria: Secondary | ICD-10-CM

## 2013-12-16 DIAGNOSIS — R1084 Generalized abdominal pain: Secondary | ICD-10-CM

## 2013-12-16 DIAGNOSIS — R059 Cough, unspecified: Secondary | ICD-10-CM

## 2013-12-16 LAB — POCT URINALYSIS DIPSTICK
Bilirubin, UA: NEGATIVE
Glucose, UA: NEGATIVE
Ketones, UA: NEGATIVE
Leukocytes, UA: NEGATIVE
NITRITE UA: NEGATIVE
Spec Grav, UA: 1.025
UROBILINOGEN UA: 0.2
pH, UA: 6

## 2013-12-16 NOTE — Patient Instructions (Signed)
Will get blood work, urine (to rule out infection, chest xray due to cough and congestion, abdominal xray due to distension and watery stools  If you start having abdominal pain that does not go away, shortness of breath or fever or chills go to the emergency room  We will be in touch with you regarding your lab work

## 2013-12-16 NOTE — Progress Notes (Signed)
Patient ID: Steven Krause, male   DOB: [birth year], 73 y.o.   MRN: [id number]    No Known Allergies  Chief Complaint  Patient presents with  . URI    Cough, slight fever, and congestions x 2-3 days. Went to ER yesterday, patient left due long wait.  Marland Kitchen Hernia    After every meal and snack patient c/o hernia pain x few mintues   . Dysuria    Discolored urine off/on (last episode was Sunday morning- dark yellow) , currently normal     HPI: Patient is a 73 y.o. male seen in the office today for multiple concerns.  Does not feel well. Reports nose has been stopped up for 3 days. Has post nasal drip which causes cough, which he chokes on occasionally and will cause vomiting. The cough is better since yesterday.    Now having diarrhea, watery. Has been constipation, had a formed stool today at 1130 today but hen with a watery stool again at 1230.  After he eats he has some abdominal pain that quickly resolves, pain is minimal and only last a few minutes. Started yesterday when he started having diarrhea.   Reports he drinks 5-6 glasses of water a day, has dark urine today, is off and on. Reports he notices it when he drinks darker drinks (drank tea last night)  Went to the ED last night, waited 3 hours and was not seen; nurse reported she was getting several ambulances and it would be several hours before he would be seen. Therefore he left.   Pt reports No fevers or chills. No shortness of breath. No nausea or no vomiting for 2 days (reports related to choking with cough). Minimal chest congestion (hx of smoking) but reports this is new.   Pt reports HOH and is very inconsistent with HPI   Review of Systems:  Review of Systems  Constitutional: Negative for weight loss.  HENT: Positive for hearing loss. Negative for congestion, ear pain and sore throat.   Eyes: Negative for blurred vision.  Respiratory: Positive for shortness of breath. Negative for cough, hemoptysis and sputum production.         Denies smoking, hx of   Cardiovascular: Negative for chest pain and leg swelling.  Gastrointestinal: Positive for vomiting (caused by coughing), abdominal pain, diarrhea and constipation.  Genitourinary: Negative for dysuria, urgency and frequency.       Dark colored urine  Musculoskeletal: Negative for falls and myalgias.  Skin: Negative for rash.  Neurological: Negative for dizziness.  Psychiatric/Behavioral: Negative for depression and memory loss.     Past Medical History  Diagnosis Date  . Acquired polycythemia 01/05/2011  . Iron excess 01/05/2011  . Hypertension   . Allergy   . Memory loss   . Unspecified hearing loss   . Hypopotassemia   . Other specified cardiac dysrhythmias(427.89)   . Polycythemia, secondary   . 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 Unspecified disorder of kidney and ureter   . Tobacco use disorder   . Hernia of unspecified site of abdominal cavity without mention of obstruction or gangrene   . Unspecified disorder of skin and subcutaneous tissue   . Dizziness and giddiness   . Diarrhea   . Abdominal pain, generalized   . Systemic hypertension    Past Surgical History  Procedure Laterality Date  . Cholecystectomy    . Ventral hernia repair    . Inflamed colon    . US echocardiography  11/03/09    EF  50-55%,  . Nm myocar perf wall motion  8//30/11    normal   Social History:   reports that he has been smoking Pipe.  He has never used smokeless tobacco. He reports that he does not drink alcohol or use illicit drugs.  Family History  Problem Relation Age of Onset  . Colon polyps Mother 24  . Colon cancer Mother   . Stomach cancer Neg Hx   . Rectal cancer Neg Hx   . Heart disease Father   . Diabetes Sister   . Cancer Brother   . Diabetes Sister   . Diabetes Sister   . Heart disease Sister     Medications: Patient's Medications  New Prescriptions   No medications on file  Previous Medications   AMLODIPINE (NORVASC) 10 MG TABLET     TAKE 1 TABLET BY MOUTH EVERY DAY   ASPIRIN 81 MG TABLET    Take 81 mg by mouth daily.     BISACODYL (DULCOLAX PO)    Take by mouth as needed.   FUROSEMIDE (LASIX) 40 MG TABLET    TAKE 1 TABLET IN THE MORNING AND TAKE 1 TABLET IN THE EVENING   HYDROCODONE-ACETAMINOPHEN (NORCO/VICODIN) 5-325 MG PER TABLET    Take one tablet once a day for pain   SPIRONOLACTONE (ALDACTONE) 25 MG TABLET    TAKE 1 TABLET EVERY DAY   TAMSULOSIN (FLOMAX) 0.4 MG CAPS CAPSULE    TAKE ONE (1) CAPSULE BY MOUTH ONCE DAILY.  Modified Medications   No medications on file  Discontinued Medications   No medications on file     Physical Exam:  Filed Vitals:   12/16/13 1318  BP: 138/86  Pulse: 62  Temp: 97.5 F (36.4 C)  TempSrc: Oral  Resp: 10  Height: 5\' 8"  (1.727 m)  Weight: 190 lb (86.183 kg)  SpO2: 95%   Physical Exam  Constitutional: He appears well-developed and well-nourished. No distress.  HENT:  Head: Normocephalic and atraumatic.  Right Ear: External ear normal.  Left Ear: External ear normal.  Mouth/Throat: Oropharynx is clear and moist. No oropharyngeal exudate.  Only has a few remaining decayed teeth  Eyes: Conjunctivae and EOM are normal. Pupils are equal, round, and reactive to light.  Neck: Normal range of motion. Neck supple.  Cardiovascular: Normal rate, regular rhythm, normal heart sounds and intact distal pulses.   Pulmonary/Chest: Effort normal.  Some scattered rhonchi  Abdominal: Soft. Bowel sounds are normal. He exhibits distension. There is generalized tenderness.  2 large ventral hernias, reducible present  Musculoskeletal: Normal range of motion. He exhibits no edema and no tenderness.  Neurological: He is alert.  Skin: Skin is warm and dry. He is not diaphoretic.  Psychiatric: He has a normal mood and affect.   Labs reviewed: Basic Metabolic Panel:  Recent Labs  02/11/13 1440 08/29/13 0815 12/15/13 1713  NA 142 140 137  K 3.9 4.2 4.0  CL 100 97 95*  CO2 25 23 25     GLUCOSE 87 101* 150*  BUN 10 12 15   CREATININE 0.86 1.01 0.84  CALCIUM 9.5 9.4 9.8  TSH  --  1.680  --    Liver Function Tests:  Recent Labs  02/11/13 1440 08/29/13 0815 12/15/13 1713  AST 27 34 46*  ALT 23 31 45  ALKPHOS 91 76 86  BILITOT 0.4 0.5 0.8  PROT 7.1 7.2 8.9*  ALBUMIN  --   --  3.7    Recent Labs  12/15/13 1713  LIPASE  30   No results found for this basename: AMMONIA,  in the last 8760 hours CBC:  Recent Labs  02/11/13 1440 08/29/13 0815 12/15/13 1713  WBC 7.5 6.2 12.4*  NEUTROABS 5.6 4.3 11.0*  HGB 14.6 15.9 17.7*  HCT 43.3 47.1 50.1  MCV 91 93 89.6  PLT  --  261 320   Lipid Panel:  Recent Labs  08/29/13 0815  HDL 54  LDLCALC 106*  TRIG 135  CHOLHDL 3.5   TSH:  Recent Labs  08/29/13 0815  TSH 1.680   A1C: Lab Results  Component Value Date   HGBA1C 6.0* 08/29/2013     Assessment/Plan  1. Dysuria - POCT urinalysis dipstick which shows blood will get Culture, Urine  2. Generalized abdominal pain With large abdominal hernia is in No acute distress, hernias reducible -pt reports abdomen is distended at times. Reports pain at times -But now with occasional pain and watery stools with constipation, will get abd xray to rule out constipation or obstruction  - DG Abd 1 View - CBC With differential/Platelet - Basic metabolic panel  3. Cough With congestion and possible shortness of breath with activity. Decreased lung sounds. Will get chest xray at this time.  - DG Chest 2 View  Given information on when to seek immediate medial attention.

## 2013-12-17 LAB — CBC WITH DIFFERENTIAL
BASOS ABS: 0 10*3/uL (ref 0.0–0.2)
Basos: 0 %
EOS ABS: 0.1 10*3/uL (ref 0.0–0.4)
Eos: 1 %
HCT: 48.8 % (ref 37.5–51.0)
Hemoglobin: 17 g/dL (ref 12.6–17.7)
IMMATURE GRANS (ABS): 0 10*3/uL (ref 0.0–0.1)
Immature Granulocytes: 0 %
Lymphocytes Absolute: 1.3 10*3/uL (ref 0.7–3.1)
Lymphs: 16 %
MCH: 31.7 pg (ref 26.6–33.0)
MCHC: 34.8 g/dL (ref 31.5–35.7)
MCV: 91 fL (ref 79–97)
MONOS ABS: 0.7 10*3/uL (ref 0.1–0.9)
Monocytes: 8 %
NEUTROS PCT: 75 %
Neutrophils Absolute: 6.3 10*3/uL (ref 1.4–7.0)
PLATELETS: 333 10*3/uL (ref 150–379)
RBC: 5.37 x10E6/uL (ref 4.14–5.80)
RDW: 14.2 % (ref 12.3–15.4)
WBC: 8.5 10*3/uL (ref 3.4–10.8)

## 2013-12-17 LAB — BASIC METABOLIC PANEL
BUN/Creatinine Ratio: 17 (ref 10–22)
BUN: 16 mg/dL (ref 8–27)
CO2: 26 mmol/L (ref 18–29)
Calcium: 9.6 mg/dL (ref 8.6–10.2)
Chloride: 95 mmol/L — ABNORMAL LOW (ref 97–108)
Creatinine, Ser: 0.95 mg/dL (ref 0.76–1.27)
GFR calc Af Amer: 92 mL/min/{1.73_m2} (ref >59)
GFR, EST NON AFRICAN AMERICAN: 80 mL/min/{1.73_m2} (ref >59)
Glucose: 99 mg/dL (ref 65–99)
POTASSIUM: 3.8 mmol/L (ref 3.5–5.2)
SODIUM: 138 mmol/L (ref 134–144)

## 2013-12-17 LAB — URINE CULTURE: Organism ID, Bacteria: NO GROWTH

## 2013-12-25 ENCOUNTER — Other Ambulatory Visit: Payer: Self-pay | Admitting: Internal Medicine

## 2013-12-30 ENCOUNTER — Other Ambulatory Visit: Payer: Medicare Other

## 2013-12-30 DIAGNOSIS — R739 Hyperglycemia, unspecified: Secondary | ICD-10-CM

## 2013-12-30 DIAGNOSIS — E785 Hyperlipidemia, unspecified: Secondary | ICD-10-CM

## 2013-12-30 DIAGNOSIS — D751 Secondary polycythemia: Secondary | ICD-10-CM

## 2013-12-30 DIAGNOSIS — R3911 Hesitancy of micturition: Secondary | ICD-10-CM

## 2013-12-31 LAB — CBC WITH DIFFERENTIAL
Basophils Absolute: 0 10*3/uL (ref 0.0–0.2)
Basos: 1 %
Eos: 3 %
Eosinophils Absolute: 0.2 10*3/uL (ref 0.0–0.4)
HCT: 46 % (ref 37.5–51.0)
Hemoglobin: 16.3 g/dL (ref 12.6–17.7)
Immature Grans (Abs): 0 10*3/uL (ref 0.0–0.1)
Immature Granulocytes: 0 %
Lymphocytes Absolute: 1.7 10*3/uL (ref 0.7–3.1)
Lymphs: 23 %
MCH: 31.5 pg (ref 26.6–33.0)
MCHC: 35.4 g/dL (ref 31.5–35.7)
MCV: 89 fL (ref 79–97)
Monocytes Absolute: 0.6 10*3/uL (ref 0.1–0.9)
Monocytes: 9 %
Neutrophils Absolute: 4.6 10*3/uL (ref 1.4–7.0)
Neutrophils Relative %: 64 %
Platelets: 281 10*3/uL (ref 150–379)
RBC: 5.17 x10E6/uL (ref 4.14–5.80)
RDW: 13.9 % (ref 12.3–15.4)
WBC: 7.1 10*3/uL (ref 3.4–10.8)

## 2013-12-31 LAB — BASIC METABOLIC PANEL
BUN/Creatinine Ratio: 12 (ref 10–22)
BUN: 10 mg/dL (ref 8–27)
CO2: 30 mmol/L — ABNORMAL HIGH (ref 18–29)
Calcium: 9.5 mg/dL (ref 8.6–10.2)
Chloride: 92 mmol/L — ABNORMAL LOW (ref 97–108)
Creatinine, Ser: 0.86 mg/dL (ref 0.76–1.27)
GFR calc Af Amer: 100 mL/min/{1.73_m2} (ref >59)
GFR calc non Af Amer: 87 mL/min/{1.73_m2} (ref >59)
Glucose: 106 mg/dL — ABNORMAL HIGH (ref 65–99)
Potassium: 3.7 mmol/L (ref 3.5–5.2)
Sodium: 135 mmol/L (ref 134–144)

## 2013-12-31 LAB — LIPID PANEL
Chol/HDL Ratio: 3.5 ratio units (ref 0.0–5.0)
Cholesterol, Total: 187 mg/dL (ref 100–199)
HDL: 54 mg/dL (ref >39)
LDL Calculated: 114 mg/dL — ABNORMAL HIGH (ref 0–99)
Triglycerides: 95 mg/dL (ref 0–149)
VLDL Cholesterol Cal: 19 mg/dL (ref 5–40)

## 2013-12-31 LAB — HEMOGLOBIN A1C
Est. average glucose Bld gHb Est-mCnc: 128 mg/dL
Hgb A1c MFr Bld: 6.1 % — ABNORMAL HIGH (ref 4.8–5.6)

## 2014-01-03 ENCOUNTER — Encounter: Payer: Self-pay | Admitting: Internal Medicine

## 2014-01-03 ENCOUNTER — Ambulatory Visit (INDEPENDENT_AMBULATORY_CARE_PROVIDER_SITE_OTHER): Payer: Medicare Other | Admitting: Internal Medicine

## 2014-01-03 ENCOUNTER — Telehealth: Payer: Self-pay

## 2014-01-03 VITALS — BP 120/80 | HR 63 | Temp 97.6°F | Ht 68.0 in | Wt 194.4 lb

## 2014-01-03 DIAGNOSIS — I10 Essential (primary) hypertension: Secondary | ICD-10-CM

## 2014-01-03 DIAGNOSIS — Z23 Encounter for immunization: Secondary | ICD-10-CM

## 2014-01-03 DIAGNOSIS — R1084 Generalized abdominal pain: Secondary | ICD-10-CM

## 2014-01-03 DIAGNOSIS — E663 Overweight: Secondary | ICD-10-CM

## 2014-01-03 DIAGNOSIS — E785 Hyperlipidemia, unspecified: Secondary | ICD-10-CM

## 2014-01-03 DIAGNOSIS — Z720 Tobacco use: Secondary | ICD-10-CM

## 2014-01-03 DIAGNOSIS — F17200 Nicotine dependence, unspecified, uncomplicated: Secondary | ICD-10-CM

## 2014-01-03 DIAGNOSIS — R739 Hyperglycemia, unspecified: Secondary | ICD-10-CM

## 2014-01-03 MED ORDER — HYDROCODONE-ACETAMINOPHEN 5-325 MG PO TABS: ORAL_TABLET | ORAL | Status: DC

## 2014-01-03 MED ORDER — EZETIMIBE 10 MG PO TABS: 10.0000 mg | ORAL_TABLET | Freq: Every day | ORAL | Status: DC

## 2014-01-03 NOTE — Progress Notes (Signed)
Patient ID: Steven Krause, male   DOB: [birth year], 73 y.o.   MRN: [id number]   Location:  [facility]  Code Status: [name] helps make his decisions and gets him his medications  No Known Allergies  Chief Complaint  Patient presents with  . Medical Management of Chronic Issues    4 month follow up    HPI: Patient is a 73 y.o. white male seen in the office today for med mgt chronic diseases.  Gets abdominal pain, takes pain pill and it's gone.    Has cut back on smoking to once a day b/c of his tooth pain.  Still won't go to dentist.  Discussed it today again.  Doesn't like the idea.    Gets pain in his left arm and if he straightens it out real quick, it goes away.  Review of Systems:  Review of Systems  Constitutional: Negative for fever, chills and malaise/fatigue.  HENT: Negative for congestion.   Eyes: Negative for blurred vision.  Respiratory: Positive for cough. Negative for shortness of breath.   Cardiovascular: Negative for chest pain and leg swelling.  Gastrointestinal: Positive for abdominal pain. Negative for heartburn, nausea, vomiting, diarrhea, constipation, blood in stool and melena.  Genitourinary: Negative for dysuria.  Musculoskeletal: Positive for myalgias. Negative for falls.  Skin: Negative for rash.  Neurological: Negative for dizziness, loss of consciousness and weakness.  Endo/Heme/Allergies: Does not bruise/bleed easily.  Psychiatric/Behavioral: Negative for depression and memory loss.    Past Medical History  Diagnosis Date  . Acquired polycythemia 01/05/2011  . Iron excess 01/05/2011  . Hypertension   . Allergy   . Memory loss   . Unspecified hearing loss   . Hypopotassemia   . Other specified cardiac dysrhythmias(427.89)   . Polycythemia, secondary   . 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 Unspecified disorder of kidney and ureter   . Tobacco use disorder   . Hernia of unspecified site of abdominal cavity  without mention of obstruction or gangrene   . Unspecified disorder of skin and subcutaneous tissue   . Dizziness and giddiness   . Diarrhea   . Abdominal pain, generalized   . Systemic hypertension     Past Surgical History  Procedure Laterality Date  . Cholecystectomy    . Ventral hernia repair    . Inflamed colon    . US echocardiography  11/03/09    EF 50-55%,  . Nm myocar perf wall motion  8//30/11    normal    Social History:   reports that he has been smoking Pipe.  He has never used smokeless tobacco. He reports that he does not drink alcohol or use illicit drugs.  Family History  Problem Relation Age of Onset  . Colon polyps Mother 15  . Colon cancer Mother   . Stomach cancer Neg Hx   . Rectal cancer Neg Hx   . Heart disease Father   . Diabetes Sister   . Cancer Brother   . Diabetes Sister   . Diabetes Sister   . Heart disease Sister     Medications: Patient's Medications  New Prescriptions   No medications on file  Previous Medications   AMLODIPINE (NORVASC) 10 MG TABLET    TAKE 1 TABLET BY MOUTH EVERY DAY   ASPIRIN 81 MG TABLET    Take 81 mg by mouth daily.     BISACODYL (DULCOLAX PO)    Take by mouth as needed.   FUROSEMIDE (LASIX)  40 MG TABLET    TAKE 1 TABLET IN THE MORNING AND TAKE 1 TABLET IN THE EVENING   HYDROCODONE-ACETAMINOPHEN (NORCO/VICODIN) 5-325 MG PER TABLET    Take one tablet once a day for pain   SPIRONOLACTONE (ALDACTONE) 25 MG TABLET    TAKE 1 TABLET EVERY DAY   TAMSULOSIN (FLOMAX) 0.4 MG CAPS CAPSULE    TAKE ONE (1) CAPSULE BY MOUTH ONCE DAILY.  Modified Medications   No medications on file  Discontinued Medications   No medications on file     Physical Exam: Filed Vitals:   01/03/14 0951  BP: 120/80  Pulse: 63  Temp: 97.6 F (36.4 C)  TempSrc: Oral  Height: 5\' 8"  (1.727 m)  Weight: 194 lb 6.4 oz (88.179 kg)  SpO2: 97%  Physical Exam  Constitutional: He is oriented to person, place, and time. He appears well-developed and  well-nourished. No distress.  Smells like pipe smoke  HENT:  Only a few remaining rotten teeth  Cardiovascular: Normal rate, regular rhythm, normal heart sounds and intact distal pulses.   Pulmonary/Chest: Effort normal and breath sounds normal. No respiratory distress.  Abdominal: Soft. Bowel sounds are normal. There is no tenderness.  Abdominal wall hernias  Musculoskeletal: Normal range of motion.  Neurological: He is alert and oriented to person, place, and time.  Skin: Skin is warm and dry.  Psychiatric: He has a normal mood and affect.    Labs reviewed: Basic Metabolic Panel:  Recent Labs  02/11/13 1440 08/29/13 0815 12/15/13 1713 12/16/13 1413 12/30/13 0820  NA 142 140 137 138 135  K 3.9 4.2 4.0 3.8 3.7  CL 100 97 95* 95* 92*  CO2 25 23 25 26  30*  GLUCOSE 87 101* 150* 99 106*  BUN 10 12 15 16 10   CREATININE 0.86 1.01 0.84 0.95 0.86  CALCIUM 9.5 9.4 9.8 9.6 9.5  TSH  --  1.680  --   --   --    Liver Function Tests:  Recent Labs  02/11/13 1440 08/29/13 0815 12/15/13 1713  AST 27 34 46*  ALT 23 31 45  ALKPHOS 91 76 86  BILITOT 0.4 0.5 0.8  PROT 7.1 7.2 8.9*  ALBUMIN  --   --  3.7    Recent Labs  12/15/13 1713  LIPASE 30   No results found for this basename: AMMONIA,  in the last 8760 hours CBC:  Recent Labs  12/15/13 1713 12/16/13 1413 12/30/13 0820  WBC 12.4* 8.5 7.1  NEUTROABS 11.0* 6.3 4.6  HGB 17.7* 17.0 16.3  HCT 50.1 48.8 46.0  MCV 89.6 91 89  PLT 320 333 281   Lipid Panel:  Recent Labs  08/29/13 0815 12/30/13 0820  HDL 54 54  LDLCALC 106* 114*  TRIG 135 95  CHOLHDL 3.5 3.5   Lab Results  Component Value Date   HGBA1C 6.1* 12/30/2013    Assessment/Plan 1. Hyperlipidemia LDL goal <100 -will try him on cholesterol medication other than statin and see if this will mildly lower his values -he is at risk of MI with his smoking, obesity - ezetimibe (ZETIA) 10 MG tablet; Take 1 tablet (10 mg total) by mouth daily.  Dispense:  30 tablet; Refill: 3 - Lipid panel; Future  2. Hyperglycemia -dietary changes encouraged and exercise, cont to monitor - CBC With differential/Platelet; Future - Comprehensive metabolic panel; Future - Hemoglobin A1c; Future  3. Generalized abdominal pain -due to longstanding hernias w/o obstruction - HYDROcodone-acetaminophen (NORCO/VICODIN) 5-325 MG per tablet; Take  one tablet once a day for pain  Dispense: 60 tablet; Refill: 0  4. Tobacco use disorder -continues to smoke a pipe daily  5. Essential hypertension -bp at goal  6. Overweight (BMI 25.0-29.9) -does not diet or exercise despite encouragement  7. Need for prophylactic vaccination and inoculation against influenza -given today   Labs/tests ordered:   Orders Placed This Encounter  Procedures  . CBC With differential/Platelet    Standing Status: Future     Number of Occurrences:      Standing Expiration Date: 09/04/2014  . Comprehensive metabolic panel    Standing Status: Future     Number of Occurrences:      Standing Expiration Date: 09/04/2014  . Hemoglobin A1c    Standing Status: Future     Number of Occurrences:      Standing Expiration Date: 09/04/2014  . Lipid panel    Standing Status: Future     Number of Occurrences:      Standing Expiration Date: 09/04/2014    Next appt:  4 mos with labs before  Ahmeer Tuman L. Lekeya Rollings, D.O. [facility] [address], [place] [address] Cell Phone (Mon-Fri 8am-5pm):  [phone] On Call:  [phone] & follow prompts after 5pm & weekends Office Phone:  [phone] Office Fax:  [phone]

## 2014-01-03 NOTE — Telephone Encounter (Signed)
Message left on triage voicemail: [name] is very concerned about paperwork sent home with patient- (Living will/POA), please call to discuss.  I called [name] and informed her that we recommended Living will/POA routinely for all patient's, despite age or health conditions. [name] also had me briefly go over patient's labs. [name] was at ease and verbalized understanding of labs and POA paperwork.

## 2014-03-30 ENCOUNTER — Other Ambulatory Visit: Payer: Self-pay | Admitting: Internal Medicine

## 2014-04-09 ENCOUNTER — Encounter: Payer: Self-pay | Admitting: Cardiovascular Disease

## 2014-04-09 ENCOUNTER — Ambulatory Visit (INDEPENDENT_AMBULATORY_CARE_PROVIDER_SITE_OTHER): Payer: Medicare Other | Admitting: Cardiovascular Disease

## 2014-04-09 ENCOUNTER — Telehealth: Payer: Self-pay | Admitting: Cardiovascular Disease

## 2014-04-09 VITALS — BP 141/87 | HR 63 | Resp 16 | Ht 68.0 in | Wt 192.5 lb

## 2014-04-09 DIAGNOSIS — I4891 Unspecified atrial fibrillation: Secondary | ICD-10-CM

## 2014-04-09 MED ORDER — RIVAROXABAN 20 MG PO TABS: 20.0000 mg | ORAL_TABLET | Freq: Every day | ORAL | Status: DC

## 2014-04-09 NOTE — Telephone Encounter (Signed)
Spoke to [name], (neighbor on [employer]) She acknowledged medication change done today (Xarelto), understands dose and time.  She asked about ECG, told I do not have results on hand but we can contact if anything is abnormal.  If any additional instructions for caller, please advise, o/w can close.

## 2014-04-09 NOTE — Patient Instructions (Signed)
STOP Aspirin.  START Xarelto 20mg  daily at dinner time.  Dr. Sallyanne Kuster recommends that you schedule a follow-up appointment in: 3 months.

## 2014-04-09 NOTE — Telephone Encounter (Signed)
Steven Krause is calling to find out if there was anything that needed to be discussed or any medications changes or anything that she needs to aware of . Steven Krause had a visit today with Dr. Sallyanne Kuster . Please call  Thanks

## 2014-04-09 NOTE — Progress Notes (Signed)
Patient ID: Steven Krause, male   DOB: 1941/01/05, 74 y.o.   MRN: [id number]     Reason for office visit Persistent atrial fibrillation  [name] continues to remain in atrial fibrillation with spontaneously controlled ventricular rate, which may now be a permanent arrhythmia for him. As before he remains oblivious to the irregularities in heart rhythm, whether they be related to the new atrial fibrillation or the long-standing PVCs. He has not had any bleeding problems or falls. He has not had any focal neurological events to suggest stroke or transient ischemic attack. He lives alone and does not feel limited by any cardiac symptoms.   No Known Allergies  Current Outpatient Prescriptions  Medication Sig Dispense Refill  . amLODipine (NORVASC) 10 MG tablet TAKE 1 TABLET BY MOUTH EVERY DAY 30 tablet 5  . Bisacodyl (DULCOLAX PO) Take by mouth as needed.    . furosemide (LASIX) 40 MG tablet TAKE 1 TABLET IN THE MORNING AND TAKE 1 TABLET IN THE EVENING 60 tablet 5  . HYDROcodone-acetaminophen (NORCO/VICODIN) 5-325 MG per tablet Take one tablet once a day for pain 60 tablet 0  . spironolactone (ALDACTONE) 25 MG tablet TAKE 1 TABLET EVERY DAY 30 tablet 5  . tamsulosin (FLOMAX) 0.4 MG CAPS capsule TAKE ONE (1) CAPSULE BY MOUTH ONCE DAILY. 30 capsule 5  . ZETIA 10 MG tablet TAKE 1 TABLET (10 MG TOTAL) BY MOUTH DAILY. 30 tablet 5  . rivaroxaban (XARELTO) 20 MG TABS tablet Take 1 tablet (20 mg total) by mouth daily with supper. 30 tablet 6   No current facility-administered medications for this visit.    Past Medical History  Diagnosis Date  . Acquired polycythemia 01/05/2011  . Iron excess 01/05/2011  . Hypertension   . Allergy   . Memory loss   . Unspecified hearing loss   . Hypopotassemia   . Other specified cardiac dysrhythmias(427.89)   . Polycythemia, secondary   . 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 Unspecified disorder of kidney and ureter   . Tobacco use disorder   . Hernia of  unspecified site of abdominal cavity without mention of obstruction or gangrene   . Unspecified disorder of skin and subcutaneous tissue   . Dizziness and giddiness   . Diarrhea   . Abdominal pain, generalized   . Systemic hypertension     Past Surgical History  Procedure Laterality Date  . Cholecystectomy    . Ventral hernia repair    . Inflamed colon    . US echocardiography  11/03/09    EF 50-55%,  . Nm myocar perf wall motion  8//30/11    normal    Family History  Problem Relation Age of Onset  . Colon polyps Mother 80  . Colon cancer Mother   . Stomach cancer Neg Hx   . Rectal cancer Neg Hx   . Heart disease Father   . Diabetes Sister   . Cancer Brother   . Diabetes Sister   . Diabetes Sister   . Heart disease Sister     History   Social History  . Marital Status: Single    Spouse Name: N/A    Number of Children: N/A  . Years of Education: N/A   Occupational History  . Not on file.   Social History Main Topics  . Smoking status: Current Every Day Smoker -- 0.25 packs/day for 55 years    Types: Pipe  . Smokeless tobacco: Never Used  . Alcohol Use: No  . Drug Use:  No  . Sexual Activity: Not on file   Other Topics Concern  . Not on file   Social History Narrative    Review of systems: The patient specifically denies any chest pain at rest or with exertion, dyspnea at rest or with exertion, orthopnea, paroxysmal nocturnal dyspnea, syncope, palpitations, focal neurological deficits, intermittent claudication, lower extremity edema, unexplained weight gain, cough, hemoptysis or wheezing.  The patient also denies abdominal pain, nausea, vomiting, dysphagia, diarrhea, constipation, polyuria, polydipsia, dysuria, hematuria, frequency, urgency, abnormal bleeding or bruising, fever, chills, unexpected weight changes, mood swings, change in skin or hair texture, change in voice quality, auditory or visual problems, allergic reactions or rashes, new musculoskeletal  complaints other than usual "aches and pains".   PHYSICAL EXAM BP 148/92 mmHg  Pulse 63  Ht 5\' 8"  (1.727 m)  Wt 192 lb 8 oz (87.317 kg)  BMI 29.28 kg/m2  General: Alert, oriented x3, no distress Head: no evidence of trauma, PERRL, EOMI, no exophtalmos or lid lag, no myxedema, no xanthelasma; normal ears, nose and oropharynx Neck: normal jugular venous pulsations and no hepatojugular reflux; brisk carotid pulses without delay and no carotid bruits Chest: clear to auscultation, no signs of consolidation by percussion or palpation, normal fremitus, symmetrical and full respiratory excursions Cardiovascular: normal position and quality of the apical impulse, regular rhythm, normal first and second heart sounds, no murmurs, rubs or gallops Abdomen: no tenderness or distention, no masses by palpation, no abnormal pulsatility or arterial bruits, normal bowel sounds, no hepatosplenomegaly Extremities: no clubbing, cyanosis or edema; 2+ radial, ulnar and brachial pulses bilaterally; 2+ right femoral, posterior tibial and dorsalis pedis pulses; 2+ left femoral, posterior tibial and dorsalis pedis pulses; no subclavian or femoral bruits Neurological: grossly nonfocal   EKG: atrial fibrillation with controlled rate and frequent monomorphic PVCs (sometimes with fusion), nonspecific T-wave abnormality. The PVCs have a left bundle branch block superior and leftward axis  Lipid Panel     Component Value Date/Time   TRIG 95 12/30/2013 0820   HDL 54 12/30/2013 0820   CHOLHDL 3.5 12/30/2013 0820   LDLCALC 114* 12/30/2013 0820    BMET    Component Value Date/Time   NA 135 12/30/2013 0820   NA 137 12/15/2013 1713   NA 140 09/21/2012 1016   NA 141 01/12/2009 1259   K 3.7 12/30/2013 0820   K 3.6 09/21/2012 1016   K 4.0 01/12/2009 1259   CL 92* 12/30/2013 0820   CL 102 01/12/2009 1259   CO2 30* 12/30/2013 0820   CO2 28 09/21/2012 1016   CO2 30 01/12/2009 1259   GLUCOSE 106* 12/30/2013 0820    GLUCOSE 150* 12/15/2013 1713   GLUCOSE 105 09/21/2012 1016   GLUCOSE 102 01/12/2009 1259   BUN 10 12/30/2013 0820   BUN 15 12/15/2013 1713   BUN 10.1 09/21/2012 1016   BUN 9 01/12/2009 1259   CREATININE 0.86 12/30/2013 0820   CREATININE 0.9 09/21/2012 1016   CREATININE 0.8 01/12/2009 1259   CALCIUM 9.5 12/30/2013 0820   CALCIUM 9.3 09/21/2012 1016   CALCIUM 9.2 01/12/2009 1259   GFRNONAA 87 12/30/2013 0820   GFRAA 100 12/30/2013 0820     ASSESSMENT AND PLAN  Kei's atrial fibrillation may be a permanent arrhythmia. It is asymptomatic and spontaneously rate controlled. Cardioversion/antiarrhythmics are not needed.  I remain a little ambivalent about the risk/benefit ratio of full anticoagulation. He meets guideline imposed criteria for anticoagulation. His only real embolic risk factor is hypertension, but he does  have advancing age. I am quite pleased with his compliance with follow-up and recommendations. We discussed the options of warfarin versus novel anticoagulants. He has some difficulty reading the labels on his medications and I judge would have difficulty dosing warfarin and adjusting this prescription frequently. I have recommended that he start treatment with Xarelto 20 mg with his evening meal daily. Stressed the importance of taking the medication with food. Discussed the potential for bleeding complications. Stop aspirin when he starts taking the new medicationAgain recommended smoking cessation, but I don't think he's going to give up his pipe.    Orders Placed This Encounter  Procedures  . EKG 12-Lead   Meds ordered this encounter  Medications  . rivaroxaban (XARELTO) 20 MG TABS tablet    Sig: Take 1 tablet (20 mg total) by mouth daily with supper.    Dispense:  30 tablet    Refill:  Taylor Daionna Crossland, MD, [facility] [phone] office [phone] pager

## 2014-05-01 ENCOUNTER — Other Ambulatory Visit: Payer: Medicare Other

## 2014-05-01 DIAGNOSIS — E785 Hyperlipidemia, unspecified: Secondary | ICD-10-CM

## 2014-05-01 DIAGNOSIS — R739 Hyperglycemia, unspecified: Secondary | ICD-10-CM

## 2014-05-02 LAB — LIPID PANEL
Chol/HDL Ratio: 2.8 ratio units (ref 0.0–5.0)
Cholesterol, Total: 166 mg/dL (ref 100–199)
HDL: 59 mg/dL (ref >39)
LDL Calculated: 87 mg/dL (ref 0–99)
Triglycerides: 98 mg/dL (ref 0–149)
VLDL Cholesterol Cal: 20 mg/dL (ref 5–40)

## 2014-05-02 LAB — COMPREHENSIVE METABOLIC PANEL
ALT: 38 IU/L (ref 0–44)
AST: 37 IU/L (ref 0–40)
Albumin/Globulin Ratio: 1.2 (ref 1.1–2.5)
Albumin: 4 g/dL (ref 3.5–4.8)
Alkaline Phosphatase: 76 IU/L (ref 39–117)
BUN/Creatinine Ratio: 11 (ref 10–22)
BUN: 10 mg/dL (ref 8–27)
Bilirubin Total: 0.5 mg/dL (ref 0.0–1.2)
CO2: 24 mmol/L (ref 18–29)
Calcium: 9.2 mg/dL (ref 8.6–10.2)
Chloride: 92 mmol/L — ABNORMAL LOW (ref 97–108)
Creatinine, Ser: 0.93 mg/dL (ref 0.76–1.27)
GFR calc Af Amer: 94 mL/min/{1.73_m2} (ref >59)
GFR calc non Af Amer: 81 mL/min/{1.73_m2} (ref >59)
Globulin, Total: 3.3 g/dL (ref 1.5–4.5)
Glucose: 111 mg/dL — ABNORMAL HIGH (ref 65–99)
Potassium: 3.6 mmol/L (ref 3.5–5.2)
Sodium: 134 mmol/L (ref 134–144)
Total Protein: 7.3 g/dL (ref 6.0–8.5)

## 2014-05-02 LAB — CBC WITH DIFFERENTIAL
Basophils Absolute: 0 10*3/uL (ref 0.0–0.2)
Basos: 1 %
Eos: 2 %
Eosinophils Absolute: 0.2 10*3/uL (ref 0.0–0.4)
HCT: 47.7 % (ref 37.5–51.0)
Hemoglobin: 16.2 g/dL (ref 12.6–17.7)
Immature Grans (Abs): 0 10*3/uL (ref 0.0–0.1)
Immature Granulocytes: 0 %
Lymphocytes Absolute: 1.7 10*3/uL (ref 0.7–3.1)
Lymphs: 24 %
MCH: 30.7 pg (ref 26.6–33.0)
MCHC: 34 g/dL (ref 31.5–35.7)
MCV: 90 fL (ref 79–97)
Monocytes Absolute: 0.6 10*3/uL (ref 0.1–0.9)
Monocytes: 9 %
Neutrophils Absolute: 4.6 10*3/uL (ref 1.4–7.0)
Neutrophils Relative %: 64 %
RBC: 5.28 x10E6/uL (ref 4.14–5.80)
RDW: 13.5 % (ref 12.3–15.4)
WBC: 7.3 10*3/uL (ref 3.4–10.8)

## 2014-05-02 LAB — HEMOGLOBIN A1C
Est. average glucose Bld gHb Est-mCnc: 131 mg/dL
Hgb A1c MFr Bld: 6.2 % — ABNORMAL HIGH (ref 4.8–5.6)

## 2014-05-05 ENCOUNTER — Encounter: Payer: Self-pay | Admitting: Internal Medicine

## 2014-05-05 ENCOUNTER — Ambulatory Visit (INDEPENDENT_AMBULATORY_CARE_PROVIDER_SITE_OTHER): Payer: Medicare Other | Admitting: Internal Medicine

## 2014-05-05 VITALS — BP 136/72 | HR 71 | Temp 97.6°F | Resp 20 | Ht 68.0 in | Wt 189.4 lb

## 2014-05-05 DIAGNOSIS — I4891 Unspecified atrial fibrillation: Secondary | ICD-10-CM

## 2014-05-05 DIAGNOSIS — Z720 Tobacco use: Secondary | ICD-10-CM | POA: Diagnosis not present

## 2014-05-05 DIAGNOSIS — H538 Other visual disturbances: Secondary | ICD-10-CM

## 2014-05-05 DIAGNOSIS — I10 Essential (primary) hypertension: Secondary | ICD-10-CM

## 2014-05-05 DIAGNOSIS — E785 Hyperlipidemia, unspecified: Secondary | ICD-10-CM

## 2014-05-05 DIAGNOSIS — R739 Hyperglycemia, unspecified: Secondary | ICD-10-CM | POA: Diagnosis not present

## 2014-05-05 DIAGNOSIS — F17200 Nicotine dependence, unspecified, uncomplicated: Secondary | ICD-10-CM

## 2014-05-05 NOTE — Progress Notes (Signed)
Patient ID: Steven Krause, male   DOB: [birth year], 74 y.o.   MRN: [id number]   Location:  [facility] / [place]   No Known Allergies  Chief Complaint  Patient presents with  . Medical Management of Chronic Issues    4 month follow up    HPI: Patient is a 74 y.o. white male seen in the office today for med mgt of chronic diseases.    His cholesterol has improved.  His sugar average remains elevated.  He says he was given xanax by his cardiologist, but we can't find any notes to suggest this nor a prescription in epic.  Turns out he was given xarelto NOT xanax. Says since taking it, he has had pain in his left chest up his right shoulder, then across his chest.  Will hurt up around the shoulder.  Says he has not lifted anything heavy or hurt his shoulder.  No shortness of breath, diaphoresis.   Gets blurred vision sometimes.  No pain.  Has not been to an eye doctor.    Sometimes feels like he'll vomit, but can't--happens after he lays down to take a nap and he goes to get up.  Having loose stools lately--not painful, not every time.  No blood in stool.  Goes daily to twice a day.    Review of Systems:  Review of Systems  Constitutional: Positive for weight loss. Negative for fever, chills and malaise/fatigue.  HENT: Negative for congestion.        Rotten teeth  Eyes: Positive for blurred vision.       Off and on  Respiratory: Negative for shortness of breath.   Cardiovascular: Positive for chest pain. Negative for palpitations.  Gastrointestinal: Negative for abdominal pain.  Genitourinary: Negative for dysuria.  Musculoskeletal: Positive for joint pain.       Left shoulder pain  Neurological: Negative for dizziness and weakness.  Psychiatric/Behavioral: Negative for memory loss.     Past Medical History  Diagnosis Date  . Acquired polycythemia 01/05/2011  . Iron excess 01/05/2011  . Hypertension   . Allergy   . Memory loss   .  Unspecified hearing loss   . Hypopotassemia   . Other specified cardiac dysrhythmias(427.89)   . Polycythemia, secondary   . 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 Unspecified disorder of kidney and ureter   . Tobacco use disorder   . Hernia of unspecified site of abdominal cavity without mention of obstruction or gangrene   . Unspecified disorder of skin and subcutaneous tissue   . Dizziness and giddiness   . Diarrhea   . Abdominal pain, generalized   . Systemic hypertension     Past Surgical History  Procedure Laterality Date  . Cholecystectomy    . Ventral hernia repair    . Inflamed colon    . US echocardiography  11/03/09    EF 50-55%,  . Nm myocar perf wall motion  8//30/11    normal    Social History:   reports that he has been smoking Pipe.  He has never used smokeless tobacco. He reports that he does not drink alcohol or use illicit drugs.  Family History  Problem Relation Age of Onset  . Colon polyps Mother 31  . Colon cancer Mother   . Stomach cancer Neg Hx   . Rectal cancer Neg Hx   . Heart disease Father   . Diabetes Sister   . Cancer Brother   . Diabetes Sister   .  Diabetes Sister   . Heart disease Sister     Medications: Patient's Medications  New Prescriptions   No medications on file  Previous Medications   AMLODIPINE (NORVASC) 10 MG TABLET    TAKE 1 TABLET BY MOUTH EVERY DAY   BISACODYL (DULCOLAX PO)    Take by mouth as needed.   FUROSEMIDE (LASIX) 40 MG TABLET    TAKE 1 TABLET IN THE MORNING AND TAKE 1 TABLET IN THE EVENING   HYDROCODONE-ACETAMINOPHEN (NORCO/VICODIN) 5-325 MG PER TABLET    Take one tablet once a day for pain   RIVAROXABAN (XARELTO) 20 MG TABS TABLET    Take 1 tablet (20 mg total) by mouth daily with supper.   SPIRONOLACTONE (ALDACTONE) 25 MG TABLET    TAKE 1 TABLET EVERY DAY   TAMSULOSIN (FLOMAX) 0.4 MG CAPS CAPSULE    TAKE ONE (1) CAPSULE BY MOUTH ONCE DAILY.   ZETIA 10 MG TABLET    TAKE 1 TABLET (10 MG TOTAL) BY MOUTH DAILY.  Modified  Medications   No medications on file  Discontinued Medications   No medications on file     Physical Exam: Filed Vitals:   05/05/14 1316  BP: 136/72  Pulse: 71  Temp: 97.6 F (36.4 C)  TempSrc: Oral  Resp: 20  Height: 5\' 8"  (1.727 m)  Weight: 189 lb 6.4 oz (85.911 kg)  SpO2: 95%  Physical Exam  Constitutional: He is oriented to person, place, and time.  HENT:  Rotten teeth  Eyes: Conjunctivae and EOM are normal. Pupils are equal, round, and reactive to light.  Cardiovascular: Intact distal pulses.   irreg irreg  Pulmonary/Chest: Effort normal and breath sounds normal.  Abdominal: Soft. Bowel sounds are normal. A hernia is present.  Musculoskeletal: Normal range of motion. He exhibits tenderness.  Left shoulder/chest muscles tender to touch when abducts arm  Neurological: He is alert and oriented to person, place, and time.  Skin: Skin is warm and dry.  Psychiatric: He has a normal mood and affect.     Labs reviewed: Basic Metabolic Panel:  Recent Labs  08/29/13 0815  12/16/13 1413 12/30/13 0820 05/01/14 0812  NA 140  < > 138 135 134  K 4.2  < > 3.8 3.7 3.6  CL 97  < > 95* 92* 92*  CO2 23  < > 26 30* 24  GLUCOSE 101*  < > 99 106* 111*  BUN 12  < > 16 10 10   CREATININE 1.01  < > 0.95 0.86 0.93  CALCIUM 9.4  < > 9.6 9.5 9.2  TSH 1.680  --   --   --   --   < > = values in this interval not displayed. Liver Function Tests:  Recent Labs  08/29/13 0815 12/15/13 1713 05/01/14 0812  AST 34 46* 37  ALT 31 45 38  ALKPHOS 76 86 76  BILITOT 0.5 0.8 0.5  PROT 7.2 8.9* 7.3  ALBUMIN  --  3.7  --     Recent Labs  12/15/13 1713  LIPASE 30   No results for input(s): AMMONIA in the last 8760 hours. CBC:  Recent Labs  12/15/13 1713 12/16/13 1413 12/30/13 0820 05/01/14 0812  WBC 12.4* 8.5 7.1 7.3  NEUTROABS 11.0* 6.3 4.6 4.6  HGB 17.7* 17.0 16.3 16.2  HCT 50.1 48.8 46.0 47.7  MCV 89.6 91 89 90  PLT 320 333 281  --    Lipid Panel:  Recent Labs   08/29/13 0815 12/30/13 0820  05/01/14 0812  CHOL 187 187 166  HDL 54 54 59  LDLCALC 106* 114* 87  TRIG 135 95 98  CHOLHDL 3.5 3.5 2.8   Lab Results  Component Value Date   HGBA1C 6.2* 05/01/2014   Assessment/Plan 1. Blurry vision, bilateral - intermittent, still has not seen an eye doctor so referral written to one today - Ambulatory referral to Ophthalmology  2. Essential hypertension -bp fair, cont norvasc, aldactone - Basic metabolic panel; Future  3. Hyperlipidemia LDL goal <100 - continues on zetia with improvement, also is following a healthier diet and has lost weight - Lipid panel; Future  4. Hyperglycemia - persists, but has lost weight, not to the point where he needs to be started on medication - Basic metabolic panel; Future - Hemoglobin A1c; Future - Ambulatory referral to Ophthalmology  5. Tobacco use disorder - continues to smoke pipe against medical advice--has rotted all of his teeth, but won't see dentist for extraction - Ambulatory referral to Ophthalmology  6. Atrial fibrillation with slow ventricular response - was identified at cardiology and is now on xarelto--explained that his pains are related to the shoulder injury he has and not this new medicine - CBC with Differential/Platelet; Future  Labs/tests ordered:   Orders Placed This Encounter  Procedures  . Basic metabolic panel    Standing Status: Future     Number of Occurrences:      Standing Expiration Date: 11/03/2014    Order Specific Question:  Has the patient fasted?    Answer:  Yes  . Hemoglobin A1c    Standing Status: Future     Number of Occurrences:      Standing Expiration Date: 11/03/2014  . Lipid panel    Standing Status: Future     Number of Occurrences:      Standing Expiration Date: 11/03/2014    Order Specific Question:  Has the patient fasted?    Answer:  Yes  . CBC with Differential/Platelet    Standing Status: Future     Number of Occurrences:      Standing  Expiration Date: 11/03/2014  . Ambulatory referral to Ophthalmology    Referral Priority:  Routine    Referral Type:  Consultation    Referral Reason:  Specialty Services Required    Requested Specialty:  Ophthalmology    Number of Visits Requested:  1    Next appt:  3 mos with labs before  [place]. Sharai Overbay, D.O. [facility] [address], [place] [address] Cell Phone (Mon-Fri 8am-5pm):  [phone] On Call:  [phone] & follow prompts after 5pm & weekends Office Phone:  [phone] Office Fax:  [phone]

## 2014-05-05 NOTE — Patient Instructions (Signed)
Use icy hot on your left shoulder--it seems like you have a sore muscle in the left shoulder  Please cut back on your smoking.  Also continue to take the [facility] which is for your irregular heart beat.

## 2014-06-23 ENCOUNTER — Other Ambulatory Visit: Payer: Self-pay | Admitting: Internal Medicine

## 2014-07-11 ENCOUNTER — Encounter: Payer: Self-pay | Admitting: Cardiovascular Disease

## 2014-07-11 ENCOUNTER — Ambulatory Visit (INDEPENDENT_AMBULATORY_CARE_PROVIDER_SITE_OTHER): Payer: Medicare Other | Admitting: Cardiovascular Disease

## 2014-07-11 VITALS — BP 130/81 | HR 63 | Resp 16 | Ht 69.0 in | Wt 188.0 lb

## 2014-07-11 DIAGNOSIS — F17200 Nicotine dependence, unspecified, uncomplicated: Secondary | ICD-10-CM

## 2014-07-11 DIAGNOSIS — I499 Cardiac arrhythmia, unspecified: Secondary | ICD-10-CM

## 2014-07-11 DIAGNOSIS — I10 Essential (primary) hypertension: Secondary | ICD-10-CM

## 2014-07-11 DIAGNOSIS — I4891 Unspecified atrial fibrillation: Secondary | ICD-10-CM | POA: Diagnosis not present

## 2014-07-11 DIAGNOSIS — I4819 Other persistent atrial fibrillation: Secondary | ICD-10-CM

## 2014-07-11 DIAGNOSIS — I481 Persistent atrial fibrillation: Secondary | ICD-10-CM

## 2014-07-11 DIAGNOSIS — Z720 Tobacco use: Secondary | ICD-10-CM | POA: Diagnosis not present

## 2014-07-11 DIAGNOSIS — I498 Other specified cardiac arrhythmias: Secondary | ICD-10-CM

## 2014-07-11 MED ORDER — HYDROCODONE-ACETAMINOPHEN 5-325 MG PO TABS: 1.0000 | ORAL_TABLET | Freq: Four times a day (QID) | ORAL | Status: DC | PRN

## 2014-07-11 NOTE — Progress Notes (Signed)
Patient ID: Steven Krause, male   DOB: Sep 15, 1940, 74 y.o.   MRN: [id number]     Cardiology Office Note   Date:  07/11/2014   ID:  Steven Krause, DOB December 04, 1940, MRN [id number]  PCP:  Hollace Kinnier, DO  Cardiologist:   Sanda Klein, MD   Chief Complaint  Patient presents with  . Follow-up    last OV 04/09/14, dizziness on occasion. no bleeding problem since starting Xarelto      History of Present Illness: Steven Krause is a 74 y.o. male who presents for permanent atrial fibrillation with spontaneously controlled ventricular rate and longstanding frequent ventricular bigeminy. As before he remains oblivious to the irregularities in heart rhythm, whether they be related to the atrial fibrillation or the PVCs. He has not had any bleeding problems or falls. He has not had any focal neurological events to suggest stroke or transient ischemic attack. He lives alone and does not feel limited by any cardiac symptoms. He still smokes his pipe a lot - "it stops his teeth from hurting".   Past Medical History  Diagnosis Date  . Acquired polycythemia 01/05/2011  . Iron excess 01/05/2011  . Hypertension   . Allergy   . Memory loss   . Unspecified hearing loss   . Hypopotassemia   . Other specified cardiac dysrhythmias(427.89)   . Polycythemia, secondary   . 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 Unspecified disorder of kidney and ureter   . Tobacco use disorder   . Hernia of unspecified site of abdominal cavity without mention of obstruction or gangrene   . Unspecified disorder of skin and subcutaneous tissue   . Dizziness and giddiness   . Diarrhea   . Abdominal pain, generalized   . Systemic hypertension     Past Surgical History  Procedure Laterality Date  . Cholecystectomy    . Ventral hernia repair    . Inflamed colon    . US echocardiography  11/03/09    EF 50-55%,  . Nm myocar perf wall motion  8//30/11    normal     Current Outpatient Prescriptions  Medication Sig  Dispense Refill  . amLODipine (NORVASC) 10 MG tablet TAKE 1 TABLET BY MOUTH EVERY DAY 30 tablet 5  . Bisacodyl (DULCOLAX PO) Take by mouth as needed.    . furosemide (LASIX) 40 MG tablet TAKE 1 TABLET IN THE MORNING AND TAKE 1 TABLET IN THE EVENING 60 tablet 5  . HYDROcodone-acetaminophen (NORCO/VICODIN) 5-325 MG per tablet Take one tablet once a day for pain 60 tablet 0  . rivaroxaban (XARELTO) 20 MG TABS tablet Take 1 tablet (20 mg total) by mouth daily with supper. 30 tablet 6  . spironolactone (ALDACTONE) 25 MG tablet TAKE 1 TABLET EVERY DAY 30 tablet 5  . tamsulosin (FLOMAX) 0.4 MG CAPS capsule TAKE ONE (1) CAPSULE BY MOUTH ONCE DAILY. 30 capsule 5  . ZETIA 10 MG tablet TAKE 1 TABLET (10 MG TOTAL) BY MOUTH DAILY. 30 tablet 5  . HYDROcodone-acetaminophen (NORCO) 5-325 MG per tablet Take 1 tablet by mouth every 6 (six) hours as needed for moderate pain. 10 tablet 0   No current facility-administered medications for this visit.    Allergies:   Review of patient's allergies indicates no known allergies.    Social History:  The patient  reports that he has been smoking Pipe.  He has never used smokeless tobacco. He reports that he does not drink alcohol or use illicit drugs.   Family History:  The patient's family  history includes Cancer in his brother; Colon cancer in his mother; Colon polyps (age of onset: 44) in his mother; Diabetes in his sister, sister, and sister; Heart disease in his father and sister. There is no history of Stomach cancer or Rectal cancer.    ROS:  Please see the history of present illness.    Otherwise, review of systems positive for none.   All other systems are reviewed and negative.    PHYSICAL EXAM: VS:  BP 130/81 mmHg  Pulse 63  Resp 16  Ht '5\' 9"'$  (1.753 m)  Wt 188 lb (85.276 kg)  BMI 27.75 kg/m2 , BMI Body mass index is 27.75 kg/(m^2).  General: Alert, oriented x3, no distress Head: no evidence of trauma, PERRL, EOMI, no exophtalmos or lid lag, no  myxedema, no xanthelasma; normal ears, nose and oropharynx except extremely poor dentition Neck: normal jugular venous pulsations and no hepatojugular reflux; brisk carotid pulses without delay and no carotid bruits Chest: clear to auscultation, no signs of consolidation by percussion or palpation, normal fremitus, symmetrical and full respiratory excursions Cardiovascular: normal position and quality of the apical impulse, irregular rhythm, normal first and second heart sounds, no murmurs, rubs or gallops Abdomen: no tenderness or distention, no masses by palpation, no abnormal pulsatility or arterial bruits, normal bowel sounds, no hepatosplenomegaly Extremities: no clubbing, cyanosis or edema; 2+ radial, ulnar and brachial pulses bilaterally; 2+ right femoral, posterior tibial and dorsalis pedis pulses; 2+ left femoral, posterior tibial and dorsalis pedis pulses; no subclavian or femoral bruits Neurological: grossly nonfocal Psych: euthymic mood, full affect   EKG:  EKG is ordered today. The ekg ordered today demonstrates atrial fibrillation with controlled rate and frequent monomorphic PVCs (sometimes with fusion), nonspecific T-wave abnormality. The PVCs have a left bundle branch block pattern, inferior axis, R/S transition in V3 and often occur in bigeminy   Recent Labs: 08/29/2013: TSH 1.680 12/30/2013: Platelets 281 05/01/2014: ALT 38; BUN 10; Creatinine 0.93; Hemoglobin 16.2; Potassium 3.6; Sodium 134    Lipid Panel    Component Value Date/Time   CHOL 166 05/01/2014 0812   TRIG 98 05/01/2014 0812   HDL 59 05/01/2014 0812   CHOLHDL 2.8 05/01/2014 0812   LDLCALC 87 05/01/2014 0812      Wt Readings from Last 3 Encounters:  07/11/14 188 lb (85.276 kg)  05/05/14 189 lb 6.4 oz (85.911 kg)  04/09/14 192 lb 8 oz (87.317 kg)      Other studies Reviewed:   ASSESSMENT AND PLAN:  1. Permanent atrial fibrillation with controlled/slow ventricular response - avoid AV node slowing  agents (beta blockers, diltiazem, verapamil, cholinesterase inhibitors, etc.). Continue Xarelto, taken with food.  2. PVCs -frequent, chronic, asymptomatic. No Rx. Needed.  3. Tobacco abuse - he has no desire to quit.  4. HTN - elevated when he checked in, normal juat 10 minutes later.   Current medicines are reviewed at length with the patient today.  The patient does not have concerns regarding medicines.  The following changes have been made:  no change  Labs/ tests ordered today include:  Orders Placed This Encounter  Procedures  . EKG 12-Lead    Patient Instructions  Your physician wants you to follow-up in: ONE YEAR. You will receive a reminder letter in the mail two months in advance. If you don't receive a letter, please call our office to schedule the follow-up appointment.     Mikael Spray, MD  07/11/2014 6:51 PM    Sanda Klein, MD, [facility]  [facility] [phone] office [phone] pager

## 2014-07-11 NOTE — Patient Instructions (Signed)
Your physician wants you to follow-up in: ONE YEAR You will receive a reminder letter in the mail two months in advance. If you don't receive a letter, please call our office to schedule the follow-up appointment.  

## 2014-07-30 ENCOUNTER — Other Ambulatory Visit: Payer: Self-pay

## 2014-07-30 ENCOUNTER — Other Ambulatory Visit: Payer: Self-pay | Admitting: *Deleted

## 2014-07-30 DIAGNOSIS — I4891 Unspecified atrial fibrillation: Secondary | ICD-10-CM

## 2014-08-05 ENCOUNTER — Other Ambulatory Visit: Payer: Medicare Other

## 2014-08-05 DIAGNOSIS — R739 Hyperglycemia, unspecified: Secondary | ICD-10-CM

## 2014-08-05 DIAGNOSIS — I10 Essential (primary) hypertension: Secondary | ICD-10-CM

## 2014-08-05 DIAGNOSIS — I4891 Unspecified atrial fibrillation: Secondary | ICD-10-CM

## 2014-08-05 DIAGNOSIS — E785 Hyperlipidemia, unspecified: Secondary | ICD-10-CM

## 2014-08-06 LAB — BASIC METABOLIC PANEL
BUN/Creatinine Ratio: 12 (ref 10–22)
BUN: 12 mg/dL (ref 8–27)
CO2: 27 mmol/L (ref 18–29)
Calcium: 9.3 mg/dL (ref 8.6–10.2)
Chloride: 95 mmol/L — ABNORMAL LOW (ref 97–108)
Creatinine, Ser: 1.02 mg/dL (ref 0.76–1.27)
GFR calc Af Amer: 84 mL/min/{1.73_m2} (ref >59)
GFR calc non Af Amer: 73 mL/min/{1.73_m2} (ref >59)
Glucose: 108 mg/dL — ABNORMAL HIGH (ref 65–99)
Potassium: 3.3 mmol/L — ABNORMAL LOW (ref 3.5–5.2)
Sodium: 138 mmol/L (ref 134–144)

## 2014-08-06 LAB — CBC WITH DIFFERENTIAL/PLATELET
Basophils Absolute: 0 10*3/uL (ref 0.0–0.2)
Basos: 1 %
EOS (ABSOLUTE): 0.1 10*3/uL (ref 0.0–0.4)
Eos: 2 %
Hematocrit: 45.9 % (ref 37.5–51.0)
Hemoglobin: 15.7 g/dL (ref 12.6–17.7)
Immature Grans (Abs): 0 10*3/uL (ref 0.0–0.1)
Immature Granulocytes: 0 %
Lymphocytes Absolute: 1.4 10*3/uL (ref 0.7–3.1)
Lymphs: 17 %
MCH: 30.6 pg (ref 26.6–33.0)
MCHC: 34.2 g/dL (ref 31.5–35.7)
MCV: 90 fL (ref 79–97)
Monocytes Absolute: 0.7 10*3/uL (ref 0.1–0.9)
Monocytes: 8 %
Neutrophils Absolute: 5.8 10*3/uL (ref 1.4–7.0)
Neutrophils: 72 %
Platelets: 300 10*3/uL (ref 150–379)
RBC: 5.13 x10E6/uL (ref 4.14–5.80)
RDW: 13.6 % (ref 12.3–15.4)
WBC: 8 10*3/uL (ref 3.4–10.8)

## 2014-08-06 LAB — LIPID PANEL
Chol/HDL Ratio: 2.8 ratio units (ref 0.0–5.0)
Cholesterol, Total: 149 mg/dL (ref 100–199)
HDL: 53 mg/dL (ref >39)
LDL Calculated: 76 mg/dL (ref 0–99)
Triglycerides: 98 mg/dL (ref 0–149)
VLDL Cholesterol Cal: 20 mg/dL (ref 5–40)

## 2014-08-06 LAB — HEMOGLOBIN A1C
Est. average glucose Bld gHb Est-mCnc: 131 mg/dL
Hgb A1c MFr Bld: 6.2 % — ABNORMAL HIGH (ref 4.8–5.6)

## 2014-08-07 ENCOUNTER — Ambulatory Visit: Payer: Medicare Other | Admitting: Internal Medicine

## 2014-08-18 ENCOUNTER — Encounter: Payer: Self-pay | Admitting: *Deleted

## 2014-08-18 NOTE — Progress Notes (Signed)
This encounter was created in error - please disregard.

## 2014-08-19 ENCOUNTER — Other Ambulatory Visit: Payer: Self-pay

## 2014-08-19 MED ORDER — SPIRONOLACTONE 25 MG PO TABS: 25.0000 mg | ORAL_TABLET | Freq: Every day | ORAL | Status: DC

## 2014-08-25 ENCOUNTER — Ambulatory Visit (INDEPENDENT_AMBULATORY_CARE_PROVIDER_SITE_OTHER): Payer: Medicare Other | Admitting: Internal Medicine

## 2014-08-25 ENCOUNTER — Encounter: Payer: Self-pay | Admitting: Internal Medicine

## 2014-08-25 VITALS — BP 110/72 | HR 57 | Temp 97.6°F | Resp 20 | Ht 69.0 in | Wt 189.2 lb

## 2014-08-25 DIAGNOSIS — F17200 Nicotine dependence, unspecified, uncomplicated: Secondary | ICD-10-CM

## 2014-08-25 DIAGNOSIS — E876 Hypokalemia: Secondary | ICD-10-CM | POA: Diagnosis not present

## 2014-08-25 DIAGNOSIS — I4891 Unspecified atrial fibrillation: Secondary | ICD-10-CM | POA: Diagnosis not present

## 2014-08-25 DIAGNOSIS — Z720 Tobacco use: Secondary | ICD-10-CM | POA: Diagnosis not present

## 2014-08-25 DIAGNOSIS — E785 Hyperlipidemia, unspecified: Secondary | ICD-10-CM | POA: Diagnosis not present

## 2014-08-25 DIAGNOSIS — I10 Essential (primary) hypertension: Secondary | ICD-10-CM | POA: Diagnosis not present

## 2014-08-25 DIAGNOSIS — R739 Hyperglycemia, unspecified: Secondary | ICD-10-CM | POA: Diagnosis not present

## 2014-08-25 MED ORDER — HYDROCODONE-ACETAMINOPHEN 5-325 MG PO TABS
1.0000 | ORAL_TABLET | Freq: Four times a day (QID) | ORAL | Status: DC | PRN
Start: 2014-08-25 — End: 2015-04-14

## 2014-08-25 NOTE — Progress Notes (Signed)
Patient ID: Steven Krause, male   DOB: 12-12-1940, 74 y.o.   MRN: [id number]   Location:  [facility]  Code Status: full code Goals of Care: Advanced Directive information Does patient have an advance directive?: No, Would patient like information on creating an advanced directive?: Yes - Educational materials given (previously)    Chief Complaint  Patient presents with  . Medical Management of Chronic Issues    3 month follow-up, labs printed    HPI: Patient is a 74 y.o. white male seen in the office today for med mgt of chronic diseases.   Doing well.  No complaints.  No chest pain or breathing trouble.    Still smoking his pipe.  Wants his last tooth to rot out first.  BP is actually at goal with aldactone, lasix, norvasc.    On xarelto for his afib.    Says people steal others' pain meds around where he lives. Back not hurting.  Does have pain in his teeth.    Flomax has helped his urinary stream--going a little too good.    Says his vision was getting blurry when he got some soap in his eyes when showering.    Review of Systems:  Review of Systems  Constitutional: Negative for fever and chills.  HENT:       Bad teeth  Eyes: Negative for blurred vision.  Respiratory: Negative for shortness of breath.   Cardiovascular: Negative for chest pain and leg swelling.  Gastrointestinal: Negative for abdominal pain, constipation, blood in stool and melena.  Genitourinary: Positive for frequency. Negative for dysuria and urgency.  Musculoskeletal: Negative for falls.  Neurological: Negative for dizziness and loss of consciousness.  Psychiatric/Behavioral: Negative for depression and memory loss.    Past Medical History  Diagnosis Date  . Acquired polycythemia 01/05/2011  . Iron excess 01/05/2011  . Hypertension   . Allergy   . Memory loss   . Unspecified hearing loss   . Hypopotassemia   . Other specified cardiac  dysrhythmias(427.89)   . Polycythemia, secondary   . 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 Unspecified disorder of kidney and ureter   . Tobacco use disorder   . Hernia of unspecified site of abdominal cavity without mention of obstruction or gangrene   . Unspecified disorder of skin and subcutaneous tissue   . Dizziness and giddiness   . Diarrhea   . Abdominal pain, generalized   . Systemic hypertension     Past Surgical History  Procedure Laterality Date  . Cholecystectomy    . Ventral hernia repair    . Inflamed colon    . US echocardiography  11/03/09    EF 50-55%,  . Nm myocar perf wall motion  8//30/11    normal    Allergies  Allergen Reactions  . Statins    Medications: Patient's Medications  New Prescriptions   No medications on file  Previous Medications   AMLODIPINE (NORVASC) 10 MG TABLET    TAKE 1 TABLET BY MOUTH EVERY DAY   BISACODYL (DULCOLAX PO)    Take by mouth as needed.   FUROSEMIDE (LASIX) 40 MG TABLET    TAKE 1 TABLET IN THE MORNING AND TAKE 1 TABLET IN THE EVENING   RIVAROXABAN (XARELTO) 20 MG TABS TABLET    Take 1 tablet (20 mg total) by mouth daily with supper.   SPIRONOLACTONE (ALDACTONE) 25 MG TABLET    Take 1 tablet (25 mg total) by mouth daily.  TAMSULOSIN (FLOMAX) 0.4 MG CAPS CAPSULE    TAKE ONE (1) CAPSULE BY MOUTH ONCE DAILY.   ZETIA 10 MG TABLET    TAKE 1 TABLET (10 MG TOTAL) BY MOUTH DAILY.  Modified Medications   Modified Medication Previous Medication   HYDROCODONE-ACETAMINOPHEN (NORCO) 5-325 MG PER TABLET HYDROcodone-acetaminophen (NORCO) 5-325 MG per tablet      Take 1 tablet by mouth every 6 (six) hours as needed for moderate pain.    Take 1 tablet by mouth every 6 (six) hours as needed for moderate pain.  Discontinued Medications   HYDROCODONE-ACETAMINOPHEN (NORCO/VICODIN) 5-325 MG PER TABLET    Take one tablet once a day for pain    Physical Exam: Filed Vitals:   08/25/14 1321  BP: 110/72  Pulse: 57  Temp: 97.6 F (36.4 C)  TempSrc:  Oral  Resp: 20  Height: '5\' 9"'$  (1.753 m)  Weight: 189 lb 3.2 oz (85.821 kg)  SpO2: 93%   Physical Exam  Constitutional: He is oriented to person, place, and time. He appears well-developed and well-nourished. No distress.  HENT:  Poor dentition  Cardiovascular: Intact distal pulses.   irreg irreg  Pulmonary/Chest: Effort normal and breath sounds normal. No respiratory distress.  Abdominal: Soft. Bowel sounds are normal.  Musculoskeletal: Normal range of motion. He exhibits no edema or tenderness.  Neurological: He is alert and oriented to person, place, and time. No cranial nerve deficit.  Skin: Skin is warm and dry.  Psychiatric: He has a normal mood and affect. His behavior is normal.    Labs reviewed: Basic Metabolic Panel:  Recent Labs  08/29/13 0815  12/30/13 0820 05/01/14 0812 08/05/14 0811  NA 140  < > 135 134 138  K 4.2  < > 3.7 3.6 3.3*  CL 97  < > 92* 92* 95*  CO2 23  < > 30* 24 27  GLUCOSE 101*  < > 106* 111* 108*  BUN 12  < > '10 10 12  '$ CREATININE 1.01  < > 0.86 0.93 1.02  CALCIUM 9.4  < > 9.5 9.2 9.3  TSH 1.680  --   --   --   --   < > = values in this interval not displayed. Liver Function Tests:  Recent Labs  08/29/13 0815 12/15/13 1713 05/01/14 0812  AST 34 46* 37  ALT 31 45 38  ALKPHOS 76 86 76  BILITOT 0.5 0.8 0.5  PROT 7.2 8.9* 7.3  ALBUMIN  --  3.7  --     Recent Labs  12/15/13 1713  LIPASE 30   No results for input(s): AMMONIA in the last 8760 hours. CBC:  Recent Labs  12/15/13 1713 12/16/13 1413 12/30/13 0820 05/01/14 0812 08/05/14 0832  WBC 12.4* 8.5 7.1 7.3 8.0  NEUTROABS 11.0* 6.3 4.6 4.6 5.8  HGB 17.7* 17.0 16.3 16.2  --   HCT 50.1 48.8 46.0 47.7 45.9  MCV 89.6 91 89 90  --   PLT 320 333 281  --   --    Lipid Panel:  Recent Labs  12/30/13 0820 05/01/14 0812 08/05/14 0811  CHOL 187 166 149  HDL 54 59 53  LDLCALC 114* 87 76  TRIG 95 98 98  CHOLHDL 3.5 2.8 2.8   Lab Results  Component Value Date   HGBA1C  6.2* 08/05/2014    Assessment/Plan 1. Essential hypertension -bp at goal when back on spironolactone -will f/u K - Basic metabolic panel  2. Hyperlipidemia LDL goal <100 -has not tolerated  statins, is on zetia instead  3. Hyperglycemia -has modified his diet, but has not lost weight, will f/u hba1c at a future visit after he has more time to work on it - Basic metabolic panel  4. Hypokalemia -cont spironolactone and bananas and checked bmp today - Basic metabolic panel  5. Tobacco use disorder -continues to smoke pipe and says he's waiting for his bad teeth to fall out before he'll quit  6. Atrial fibrillation with slow ventricular response -cont xarelto, was in afib today, not on rate control meds, but rate is controlled on its own  Labs/tests ordered:   Orders Placed This Encounter  Procedures  . Basic metabolic panel    Next appt: 3 mos (check hba1c that day)  Kalei Meda L. Jillisa Harris, D.O. [facility] [address], [place] [address] Cell Phone (Mon-Fri 8am-5pm):  [phone] On Call:  [phone] & follow prompts after 5pm & weekends Office Phone:  [phone] Office Fax:  [phone]

## 2014-08-26 LAB — BASIC METABOLIC PANEL
BUN/Creatinine Ratio: 14 (ref 10–22)
BUN: 12 mg/dL (ref 8–27)
CO2: 27 mmol/L (ref 18–29)
Calcium: 9.5 mg/dL (ref 8.6–10.2)
Chloride: 97 mmol/L (ref 97–108)
Creatinine, Ser: 0.85 mg/dL (ref 0.76–1.27)
GFR calc Af Amer: 100 mL/min/{1.73_m2} (ref >59)
GFR calc non Af Amer: 86 mL/min/{1.73_m2} (ref >59)
Glucose: 93 mg/dL (ref 65–99)
Potassium: 3.5 mmol/L (ref 3.5–5.2)
Sodium: 140 mmol/L (ref 134–144)

## 2014-09-14 ENCOUNTER — Other Ambulatory Visit: Payer: Self-pay | Admitting: Internal Medicine

## 2014-10-06 ENCOUNTER — Encounter: Payer: Self-pay | Admitting: Internal Medicine

## 2014-10-06 ENCOUNTER — Ambulatory Visit (INDEPENDENT_AMBULATORY_CARE_PROVIDER_SITE_OTHER): Payer: Medicare Other | Admitting: Internal Medicine

## 2014-10-06 VITALS — BP 124/76 | HR 56 | Temp 97.5°F | Resp 18 | Ht 69.0 in | Wt 190.4 lb

## 2014-10-06 DIAGNOSIS — L723 Sebaceous cyst: Secondary | ICD-10-CM | POA: Diagnosis not present

## 2014-10-06 DIAGNOSIS — L02232 Carbuncle of back [any part, except buttock]: Secondary | ICD-10-CM | POA: Diagnosis not present

## 2014-10-06 DIAGNOSIS — L089 Local infection of the skin and subcutaneous tissue, unspecified: Secondary | ICD-10-CM

## 2014-10-06 MED ORDER — DOXYCYCLINE HYCLATE 100 MG PO TABS: 100.0000 mg | ORAL_TABLET | Freq: Two times a day (BID) | ORAL | Status: DC

## 2014-10-06 NOTE — Progress Notes (Signed)
Patient ID: Steven Krause, male   DOB: 1940/12/02, 74 y.o.   MRN: [id number]   Location:  [facility]  Goals of Care: Advanced Directive information Does patient have an advance directive?: No, Would patient like information on creating an advanced directive?: Yes - Educational materials given   Chief Complaint  Patient presents with  . Acute Visit     Patient c/o boil on back that has opened and is draining    HPI: Patient is a 74 y.o. white male seen in the office today for an acute visit with draining "boil" on his back.    Started to bother him about a week ago.  Then opened and drained Friday morning (3 days ago)--had to change his tshirt he had on overnight b/c drainage and blood and foul smelling material came out.  Thinks he may have had fever.  Only hurts if you mash on it real hard.  His friend put a dressing over it and put antibiotic ointment on there.  She told him,"It needed looked at."    Review of Systems:  Review of Systems  Constitutional: Positive for chills. Negative for fever and malaise/fatigue.  Skin:       Large boil on his back that's draining  Neurological: Negative for weakness.    Past Medical History  Diagnosis Date  . Acquired polycythemia 01/05/2011  . Iron excess 01/05/2011  . Hypertension   . Allergy   . Memory loss   . Unspecified hearing loss   . Hypopotassemia   . Other specified cardiac dysrhythmias(427.89)   . Polycythemia, secondary   . 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 Unspecified disorder of kidney and ureter   . Tobacco use disorder   . Hernia of unspecified site of abdominal cavity without mention of obstruction or gangrene   . Unspecified disorder of skin and subcutaneous tissue   . Dizziness and giddiness   . Diarrhea   . Abdominal pain, generalized   . Systemic hypertension     Past Surgical History  Procedure Laterality Date  . Cholecystectomy    . Ventral hernia repair    .  Inflamed colon    . US echocardiography  11/03/09    EF 50-55%,  . Nm myocar perf wall motion  8//30/11    normal    Allergies  Allergen Reactions  . Statins    Medications: Patient's Medications  New Prescriptions   DOXYCYCLINE (VIBRA-TABS) 100 MG TABLET    Take 1 tablet (100 mg total) by mouth 2 (two) times daily.  Previous Medications   AMLODIPINE (NORVASC) 10 MG TABLET    TAKE 1 TABLET BY MOUTH EVERY DAY   BISACODYL (DULCOLAX PO)    Take by mouth as needed.   FUROSEMIDE (LASIX) 40 MG TABLET    TAKE 1 TABLET IN THE MORNING AND TAKE 1 TABLET IN THE EVENING   HYDROCODONE-ACETAMINOPHEN (NORCO) 5-325 MG PER TABLET    Take 1 tablet by mouth every 6 (six) hours as needed for moderate pain.   RIVAROXABAN (XARELTO) 20 MG TABS TABLET    Take 1 tablet (20 mg total) by mouth daily with supper.   SPIRONOLACTONE (ALDACTONE) 25 MG TABLET    Take 1 tablet (25 mg total) by mouth daily.   TAMSULOSIN (FLOMAX) 0.4 MG CAPS CAPSULE    TAKE ONE CAPSULE BY MOUTH ONCE DAILY   ZETIA 10 MG TABLET    TAKE 1 TABLET (10 MG TOTAL) BY MOUTH DAILY.  Modified  Medications   No medications on file  Discontinued Medications   No medications on file    Physical Exam: Filed Vitals:   10/06/14 1145  BP: 124/76  Pulse: 56  Temp: 97.5 F (36.4 C)  TempSrc: Oral  Resp: 18  Height: '5\' 9"'$  (1.753 m)  Weight: 190 lb 6.4 oz (86.365 kg)  SpO2: 95%   Physical Exam  Cardiovascular: Normal rate and regular rhythm.   Pulmonary/Chest: Effort normal and breath sounds normal.  Skin:  Quarter-size carbuncle with drainage of blood and thick white foul-smelling discharge, surrounding skin is erythematous; only tender during I/D process when pressure applied    Labs reviewed: Basic Metabolic Panel:  Recent Labs  05/01/14 0812 08/05/14 0811 08/25/14 1352  NA 134 138 140  K 3.6 3.3* 3.5  CL 92* 95* 97  CO2 '24 27 27  '$ GLUCOSE 111* 108* 93  BUN '10 12 12  '$ CREATININE 0.93 1.02 0.85  CALCIUM 9.2 9.3 9.5   Liver  Function Tests:  Recent Labs  12/15/13 1713 05/01/14 0812  AST 46* 37  ALT 45 38  ALKPHOS 86 76  BILITOT 0.8 0.5  PROT 8.9* 7.3  ALBUMIN 3.7  --     Recent Labs  12/15/13 1713  LIPASE 30   No results for input(s): AMMONIA in the last 8760 hours. CBC:  Recent Labs  12/15/13 1713  12/16/13 1413 12/30/13 0820 05/01/14 0812 08/05/14 0832  WBC 12.4*  < > 8.5 7.1 7.3 8.0  NEUTROABS 11.0*  < > 6.3 4.6 4.6 5.8  HGB 17.7*  --  17.0 16.3 16.2  --   HCT 50.1  --  48.8 46.0 47.7 45.9  MCV 89.6  --  91 89 90  --   PLT 320  --  333 281  --   --   < > = values in this interval not displayed. Lipid Panel:  Recent Labs  12/30/13 0820 05/01/14 0812 08/05/14 0811  CHOL 187 166 149  HDL 54 59 53  LDLCALC 114* 87 76  TRIG 95 98 98  CHOLHDL 3.5 2.8 2.8   Lab Results  Component Value Date   HGBA1C 6.2* 08/05/2014   Assessment/Plan 1. Infected sebaceous cyst of skin - seems he truly had several interconnected cysts/carbuncle that became infected -benefits, risks, alternatives to I/D explained and pt consented to procedure -area on his mid upper back was cleansed with betadine x 2 and sprayed with bupivicaine topical spray, then a horizontal incision was made across the length of the carbuncle, pressure was applied and large volumes of thick white material were drained from the several interconnected follicles -advised to take antistaph abx due to superimposed infection - doxycycline (VIBRA-TABS) 100 MG tablet; Take 1 tablet (100 mg total) by mouth 2 (two) times daily.  Dispense: 20 tablet; Refill: 0  2. Carbuncle of back -was I/D'd and abx prescribed -to keep clean and dry -may continue abx ointment and dry dressing changes daily until resolves -if becomes worse, to let us know  procedure took over 20 minutes to perform and initial history and exam took another 5 mins  Labs/tests ordered: none Next appt:  Prn and Keep regular visit  Alen Matheson L. Aniello Christopoulos,  D.O. [facility] [address], [place] [address] Cell Phone (Mon-Fri 8am-5pm):  [phone] On Call:  [phone] & follow prompts after 5pm & weekends Office Phone:  [phone] Office Fax:  [phone]

## 2014-10-06 NOTE — Patient Instructions (Addendum)
Steven Krause had an infected sebaceous cyst on his back.  We opened it and got out as much blood and pus as possible.  He can continue with daily dressing changes, cleansing with dial antibacterial soap, then aplying antibacterial ointment and cover with a clean dressing.  I sent in a prescription for a 10 day course of oral antibiotics for him to take twice a day.  The redness, warmth and swelling should improve.  If it is not getting better by next Monday, let us know.

## 2014-10-07 ENCOUNTER — Encounter: Payer: Self-pay | Admitting: Nurse Practitioner

## 2014-10-13 ENCOUNTER — Other Ambulatory Visit: Payer: Self-pay | Admitting: Internal Medicine

## 2014-10-13 ENCOUNTER — Other Ambulatory Visit: Payer: Self-pay | Admitting: Cardiovascular Disease

## 2014-10-20 ENCOUNTER — Telehealth: Payer: Self-pay | Admitting: *Deleted

## 2014-10-20 NOTE — Telephone Encounter (Signed)
Received paperwork from [facility] [phone] Fax: [phone], Patient requesting PCS. Filled out and and given to Dr. Mariea Clonts to review and sign.

## 2014-10-30 ENCOUNTER — Telehealth: Payer: Self-pay

## 2014-10-30 NOTE — Telephone Encounter (Signed)
I called and discussed this with [name]. As of 2 days ago Steven Krause has not received paperwork. I was given the number [phone] to speak directly with Steven Krause at [facility]. Steven Krause stated she has not received anything. Geraldine's fax machine was down at the moment and she will re-fax form tomorrow to be completed and re-submitted.    I will forward to [facility] that will be working triage tomorrow to await fax and complete

## 2014-10-30 NOTE — Telephone Encounter (Signed)
I don't have it.   I filled something out for him quite a while ago and put it in the outgoing box.  I suspect it was faxed to the proper location.

## 2014-10-30 NOTE — Telephone Encounter (Signed)
Paperwork was received from [facility] on 10/20/14 per note from Anita May (Office manager). Patient's daughter would like a status update on these forms for [facility] services have not received them back yet. Please advise

## 2014-10-30 NOTE — Telephone Encounter (Signed)
Await new copy of form that I did before.

## 2014-10-31 NOTE — Telephone Encounter (Signed)
Form is now scanned in, form was printed and re-faxed

## 2014-11-14 ENCOUNTER — Ambulatory Visit (INDEPENDENT_AMBULATORY_CARE_PROVIDER_SITE_OTHER): Payer: Medicare Other

## 2014-11-14 DIAGNOSIS — Z23 Encounter for immunization: Secondary | ICD-10-CM | POA: Diagnosis not present

## 2014-11-24 ENCOUNTER — Ambulatory Visit: Payer: Medicare Other | Admitting: Internal Medicine

## 2014-12-01 ENCOUNTER — Ambulatory Visit (INDEPENDENT_AMBULATORY_CARE_PROVIDER_SITE_OTHER): Payer: Medicare Other | Admitting: Internal Medicine

## 2014-12-01 ENCOUNTER — Encounter: Payer: Self-pay | Admitting: Internal Medicine

## 2014-12-01 VITALS — BP 130/80 | HR 69 | Temp 97.5°F | Resp 20 | Ht 69.0 in | Wt 192.4 lb

## 2014-12-01 DIAGNOSIS — L723 Sebaceous cyst: Secondary | ICD-10-CM | POA: Diagnosis not present

## 2014-12-01 DIAGNOSIS — R739 Hyperglycemia, unspecified: Secondary | ICD-10-CM | POA: Diagnosis not present

## 2014-12-01 DIAGNOSIS — Z23 Encounter for immunization: Secondary | ICD-10-CM | POA: Diagnosis not present

## 2014-12-01 DIAGNOSIS — I4891 Unspecified atrial fibrillation: Secondary | ICD-10-CM

## 2014-12-01 DIAGNOSIS — S40811A Abrasion of right upper arm, initial encounter: Secondary | ICD-10-CM

## 2014-12-01 DIAGNOSIS — E785 Hyperlipidemia, unspecified: Secondary | ICD-10-CM | POA: Diagnosis not present

## 2014-12-01 DIAGNOSIS — L089 Local infection of the skin and subcutaneous tissue, unspecified: Secondary | ICD-10-CM

## 2014-12-01 DIAGNOSIS — I10 Essential (primary) hypertension: Secondary | ICD-10-CM

## 2014-12-01 NOTE — Patient Instructions (Signed)
Please keep your right arm abrasion clean and dry Apply antibiotic ointment to the area. Call if increased redness, warmth, swelling or pain develop.

## 2014-12-01 NOTE — Progress Notes (Signed)
Patient ID: Steven Krause, male   DOB: Dec 13, 1940, 74 y.o.   MRN: [id number]   Location:  [facility]  Goals of Care: Advanced Directive information Does patient have an advance directive?: No, Would patient like information on creating an advanced directive?: No - patient declined information   Chief Complaint  Patient presents with  . Medical Management of Chronic Issues    large scrape on rt arm    HPI: Patient is a 74 y.o. white male seen in the office today for med mgt of his chronic diseases.  He also has a large scrape on his right arm from running into his screen door.  It took a layer of skin off and was bleeding.  He cleaned it with peroxide and he says erythema has improved.    Cyst on his back has gotten better.  No longer draining or sore. Also has a small one near his left collar bone.    Continues to smoke his pipe and wait for his teeth to fall out.    Continues to have paroxysmal afib and is on xarelto.  No symptoms.  BP is controlled on his current regimen.  No side effects.     Hyperlipidemia:  Diet unchanged.  Does drink enough fluids.  Edema of ankles:  Discussed cutting back on sodium and avoiding high sodium foods in detail.  Hyperglycemia:  Needs f/u hba1c. Again diet discussed.  Review of Systems:  Review of Systems  Constitutional: Negative for fever, chills and malaise/fatigue.  HENT:       Rotten teeth  Eyes: Negative for blurred vision.  Respiratory: Negative for shortness of breath.   Cardiovascular: Positive for palpitations and leg swelling. Negative for chest pain.  Gastrointestinal: Negative for abdominal pain.  Genitourinary: Negative for dysuria.  Musculoskeletal: Negative for falls.  Skin:       Arm abrasion  Neurological: Negative for dizziness and weakness.  Psychiatric/Behavioral: Negative for memory loss.    Past Medical History  Diagnosis Date  . Acquired polycythemia 01/05/2011  .  Iron excess 01/05/2011  . Hypertension   . Allergy   . Memory loss   . Unspecified hearing loss   . Hypopotassemia   . Other specified cardiac dysrhythmias(427.89)   . Polycythemia, secondary   . 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 Unspecified disorder of kidney and ureter   . Tobacco use disorder   . Hernia of unspecified site of abdominal cavity without mention of obstruction or gangrene   . Unspecified disorder of skin and subcutaneous tissue   . Dizziness and giddiness   . Diarrhea   . Abdominal pain, generalized   . Systemic hypertension     Past Surgical History  Procedure Laterality Date  . Cholecystectomy    . Ventral hernia repair    . Inflamed colon    . US echocardiography  11/03/09    EF 50-55%,  . Nm myocar perf wall motion  8//30/11    normal    Allergies  Allergen Reactions  . Statins    Medications: Patient's Medications  New Prescriptions   No medications on file  Previous Medications   AMLODIPINE (NORVASC) 10 MG TABLET    TAKE 1 TABLET BY MOUTH EVERY DAY   BISACODYL (DULCOLAX PO)    Take by mouth as needed.   DOXYCYCLINE (VIBRA-TABS) 100 MG TABLET    Take 1 tablet (100 mg total) by mouth 2 (two) times daily.   FUROSEMIDE (LASIX) 40 MG  TABLET    TAKE 1 TABLET IN THE MORNING AND TAKE 1 TABLET IN THE EVENING   HYDROCODONE-ACETAMINOPHEN (NORCO) 5-325 MG PER TABLET    Take 1 tablet by mouth every 6 (six) hours as needed for moderate pain.   SPIRONOLACTONE (ALDACTONE) 25 MG TABLET    Take 1 tablet (25 mg total) by mouth daily.   TAMSULOSIN (FLOMAX) 0.4 MG CAPS CAPSULE    TAKE ONE CAPSULE BY MOUTH ONCE DAILY   XARELTO 20 MG TABS TABLET    TAKE 1 TABLET (20 MG TOTAL) BY MOUTH DAILY WITH SUPPER.   XARELTO 20 MG TABS TABLET    TAKE 1 TABLET (20 MG TOTAL) BY MOUTH DAILY WITH SUPPER.   ZETIA 10 MG TABLET    TAKE 1 TABLET BY MOUTH EVERY DAY   ZETIA 10 MG TABLET    TAKE 1 TABLET BY MOUTH EVERY DAY  Modified Medications   No medications on file  Discontinued Medications    No medications on file    Physical Exam: Filed Vitals:   12/01/14 1426  BP: 130/80  Pulse: 69  Temp: 97.5 F (36.4 C)  TempSrc: Oral  Resp: 20  Height: '5\' 9"'$  (1.753 m)  Weight: 192 lb 6.4 oz (87.272 kg)  SpO2: 94%   Physical Exam  Constitutional: He is oriented to person, place, and time. He appears well-developed and well-nourished. No distress.  HENT:  Rotten teeth  Cardiovascular: Normal rate, regular rhythm, normal heart sounds and intact distal pulses.   Was regular today  Pulmonary/Chest: Effort normal and breath sounds normal. No respiratory distress.  Abdominal: Soft. Bowel sounds are normal. He exhibits no distension. There is no tenderness.  Musculoskeletal: Normal range of motion.  Neurological: He is alert and oriented to person, place, and time.  Skin: Skin is warm and dry.  Right arm abrasion with eschar  Psychiatric: He has a normal mood and affect.    Labs reviewed: Basic Metabolic Panel:  Recent Labs  05/01/14 0812 08/05/14 0811 08/25/14 1352  NA 134 138 140  K 3.6 3.3* 3.5  CL 92* 95* 97  CO2 '24 27 27  '$ GLUCOSE 111* 108* 93  BUN '10 12 12  '$ CREATININE 0.93 1.02 0.85  CALCIUM 9.2 9.3 9.5   Liver Function Tests:  Recent Labs  12/15/13 1713 05/01/14 0812  AST 46* 37  ALT 45 38  ALKPHOS 86 76  BILITOT 0.8 0.5  PROT 8.9* 7.3  ALBUMIN 3.7  --     Recent Labs  12/15/13 1713  LIPASE 30   No results for input(s): AMMONIA in the last 8760 hours. CBC:  Recent Labs  12/15/13 1713  12/16/13 1413 12/30/13 0820 05/01/14 0812 08/05/14 0832  WBC 12.4*  < > 8.5 7.1 7.3 8.0  NEUTROABS 11.0*  < > 6.3 4.6 4.6 5.8  HGB 17.7*  --  17.0 16.3 16.2  --   HCT 50.1  --  48.8 46.0 47.7 45.9  MCV 89.6  --  91 89 90  --   PLT 320  --  333 281  --   --   < > = values in this interval not displayed. Lipid Panel:  Recent Labs  12/30/13 0820 05/01/14 0812 08/05/14 0811  CHOL 187 166 149  HDL 54 59 53  LDLCALC 114* 87 76  TRIG 95 98 98    CHOLHDL 3.5 2.8 2.8   Lab Results  Component Value Date   HGBA1C 6.2* 08/05/2014   Assessment/Plan 1. Infected sebaceous  cyst of skin -infection resolved, slowly healing on his back  2. Essential hypertension -bp at goal with amlodipine, furosemide, aldactone  3. Hyperlipidemia LDL goal <100 - f/u labs, cont zetia; work on diet and increase exercise - Lipid panel; Future  4. Hyperglycemia - f/u labs, work on diet and increase exercise - Hemoglobin A1c; Future  5. Atrial fibrillation with slow ventricular response - currently regular, continues on xarelto - Lipid panel; Future - Comprehensive metabolic panel; Future - CBC with Differential/Platelet; Future  6. Arm abrasion, right, initial encounter -advised to keep clean and dry and monitor for s/s of infection--let us know if they develop  7. Need for vaccination with 13-polyvalent pneumococcal conjugate vaccine -prevnar given   Labs/tests ordered:   Orders Placed This Encounter  Procedures  . Hemoglobin A1c    Standing Status: Future     Number of Occurrences:      Standing Expiration Date: 05/31/2015  . Lipid panel    Standing Status: Future     Number of Occurrences:      Standing Expiration Date: 05/31/2015    Order Specific Question:  Has the patient fasted?    Answer:  Yes  . Comprehensive metabolic panel    Standing Status: Future     Number of Occurrences:      Standing Expiration Date: 05/31/2015    Order Specific Question:  Has the patient fasted?    Answer:  Yes  . CBC with Differential/Platelet    Standing Status: Future     Number of Occurrences:      Standing Expiration Date: 05/31/2015  prevnar  Next appt:  3 mos with labs before  [place]. Mikko Lewellen, D.O. [facility] [address], [place] [address] Cell Phone (Mon-Fri 8am-5pm):  [phone] On Call:  [phone] & follow prompts after 5pm & weekends Office Phone:  [phone] Office  Fax:  [phone]

## 2014-12-02 NOTE — Addendum Note (Signed)
Addended by: Eilene Ghazi on: 12/02/2014 02:14 PM   Modules accepted: Orders

## 2014-12-10 ENCOUNTER — Telehealth: Payer: Self-pay | Admitting: Cardiovascular Disease

## 2014-12-10 NOTE — Telephone Encounter (Signed)
[name] is calling in wanting to speak Dr. Loletha Grayer about presenting a letter to the pt's life insurance company about why he is on Xarelto. She said that it seems to be a draw back on trying to get life insurance because they are thinking he is on Xarelto for circulation issues as oppose to being a blood thinner. Please f/u with the pt  Thanks

## 2014-12-10 NOTE — Telephone Encounter (Signed)
Spoke with Steven Krause, copy of last office note placed in the mail to [address] [place] [id number].

## 2015-01-08 ENCOUNTER — Telehealth: Payer: Self-pay

## 2015-01-08 NOTE — Telephone Encounter (Signed)
He needs an MMSE done

## 2015-01-08 NOTE — Telephone Encounter (Signed)
[name] from [employer] (life insurance company) is calling requesting a letter clarifying that patient has mild memory issues vs dementia/alzhimers. This letter needs to be submitted to [phone] Attention Underwriting Department.   I called patient to verify that it is ok to compose letter and submit to [facility] and he requested that I call [name]. I spoke with [name] and she confirmed that she would like this letter composed and forwarded to [facility].

## 2015-01-09 NOTE — Telephone Encounter (Signed)
Appointment scheduled for November 17 with Dr. Mariea Clonts.

## 2015-01-09 NOTE — Telephone Encounter (Signed)
No available appointments for the afternoon (patient requesting afternoon appointment only) until next month, patient needs this letter within the next couple of weeks. Please advise if same day slot to be used on 01/22/15 at 3:00 pm and if yes should it be 15 or 30 min.   Please advise, if ok please route to triage assistant of the day- Rodena Piety May/CMA.

## 2015-01-09 NOTE — Telephone Encounter (Signed)
Yes for 30 mins

## 2015-01-21 ENCOUNTER — Telehealth: Payer: Self-pay | Admitting: Cardiovascular Disease

## 2015-01-21 NOTE — Telephone Encounter (Signed)
Mrs. Steven Krause is calling because , [employer] will not poay for [name] , is asking if he can get a different medication that insurance will pay for .Marland Kitchen Please call   Thanks

## 2015-01-21 NOTE — Telephone Encounter (Signed)
LEFT MESSAGE TO CALL BACK

## 2015-01-22 ENCOUNTER — Ambulatory Visit (INDEPENDENT_AMBULATORY_CARE_PROVIDER_SITE_OTHER): Payer: Medicare Other | Admitting: Internal Medicine

## 2015-01-22 ENCOUNTER — Encounter: Payer: Self-pay | Admitting: Internal Medicine

## 2015-01-22 VITALS — BP 124/70 | HR 70 | Temp 97.6°F | Resp 20 | Ht 69.0 in | Wt 195.8 lb

## 2015-01-22 DIAGNOSIS — I10 Essential (primary) hypertension: Secondary | ICD-10-CM | POA: Diagnosis not present

## 2015-01-22 DIAGNOSIS — F0150 Vascular dementia without behavioral disturbance: Secondary | ICD-10-CM

## 2015-01-22 DIAGNOSIS — I4891 Unspecified atrial fibrillation: Secondary | ICD-10-CM

## 2015-01-22 MED ORDER — RIVAROXABAN 20 MG PO TABS: 20.0000 mg | ORAL_TABLET | Freq: Every day | ORAL | Status: DC

## 2015-01-22 NOTE — Telephone Encounter (Signed)
Returning call.

## 2015-01-22 NOTE — Telephone Encounter (Signed)
Left message to call back  

## 2015-01-22 NOTE — Progress Notes (Signed)
Patient ID: Steven Krause, male   DOB: February 23, 1941, 74 y.o.   MRN: [id number]   Location: [place] Provider: Rexene Edison. Mariea Clonts, D.O., C.M.D.  Code Status: DNR Goals of Care: Advanced Directive information Does patient have an advance directive?: No, Would patient like information on creating an advanced directive?: No - patient declined information  Chief Complaint  Patient presents with  . Medical Management of Chronic Issues    MMSE done,life ins letter,    HPI: Patient is a 74 y.o. male seen in the office today for completion of a life insurance letter that indicates he is incapable of taking care of himself independently due to memory loss/cognitive deficits.   Pt bathes and dresses himself.  He keeps track of his own appts and has been attending.  [name] helps him with his finances.  [name] puts the pills in the pillbox for him.   MMSE - Mini Mental State Exam 01/22/2015  Not completed: (No Data)  Orientation to time 5  Orientation to Place 5  Registration 3  Attention/ Calculation 0  Recall 2  Language- name 2 objects 2  Language- repeat 1  Language- follow 3 step command 3  Language- read & follow direction 1  Write a sentence 1  Copy design 1  Total score 24  Failed clock drawing. Functional Status Survey: Is the patient deaf or have difficulty hearing?: No Does the patient have difficulty seeing, even when wearing glasses/contacts?: No Does the patient have difficulty concentrating, remembering, or making decisions?: Yes Does the patient have difficulty walking or climbing stairs?: No Does the patient have difficulty dressing or bathing?: No Does the patient have difficulty doing errands alone such as visiting a doctor's office or shopping?: No  Tells me he had a "kidney infection" early this week from drinking too many juices and things instead of water.  Drank a lot of clear regular water and got better.    Scab keeps coming off the site of his  sebaceous cyst.  Review of Systems:  Review of Systems  Constitutional: Negative for fever and chills.  HENT: Negative for congestion.        Rotten teeth  Eyes: Negative for blurred vision.  Respiratory: Negative for shortness of breath.   Cardiovascular: Negative for chest pain.  Gastrointestinal: Negative for abdominal pain.       Hernias  Genitourinary:       Dysuria, frequency and urgency resolved  Musculoskeletal: Negative for falls.  Skin: Negative for rash.       Cyst on back--scab keeps coming and going  Neurological: Negative for dizziness and loss of consciousness.  Psychiatric/Behavioral: Positive for memory loss. Negative for depression. The patient is not nervous/anxious and does not have insomnia.     Past Medical History  Diagnosis Date  . Acquired polycythemia 01/05/2011  . Iron excess 01/05/2011  . Hypertension   . Allergy   . Memory loss   . Unspecified hearing loss   . Hypopotassemia   . Other specified cardiac dysrhythmias(427.89)   . Polycythemia, secondary   . 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 Unspecified disorder of kidney and ureter   . Tobacco use disorder   . Hernia of unspecified site of abdominal cavity without mention of obstruction or gangrene   . Unspecified disorder of skin and subcutaneous tissue   . Dizziness and giddiness   . Diarrhea   . Abdominal pain, generalized   . Systemic hypertension     Past Surgical History  Procedure Laterality Date  . Cholecystectomy    . Ventral hernia repair    . Inflamed colon    . US echocardiography  11/03/09    EF 50-55%,  . Nm myocar perf wall motion  8//30/11    normal    Allergies  Allergen Reactions  . Statins       Medication List       This list is accurate as of: 01/22/15  3:18 PM.  Always use your most recent med list.               amLODipine 10 MG tablet  Commonly known as:  NORVASC  TAKE 1 TABLET BY MOUTH EVERY DAY     DULCOLAX PO  Take by mouth as needed.      furosemide 40 MG tablet  Commonly known as:  LASIX  TAKE 1 TABLET IN THE MORNING AND TAKE 1 TABLET IN THE EVENING     HYDROcodone-acetaminophen 5-325 MG tablet  Commonly known as:  NORCO  Take 1 tablet by mouth every 6 (six) hours as needed for moderate pain.     rivaroxaban 20 MG Tabs tablet  Commonly known as:  XARELTO  Take 1 tablet (20 mg total) by mouth daily with supper.     spironolactone 25 MG tablet  Commonly known as:  ALDACTONE  Take 1 tablet (25 mg total) by mouth daily.     tamsulosin 0.4 MG Caps capsule  Commonly known as:  FLOMAX  TAKE ONE CAPSULE BY MOUTH ONCE DAILY     ZETIA 10 MG tablet  Generic drug:  ezetimibe  TAKE 1 TABLET BY MOUTH EVERY DAY        Health Maintenance  Topic Date Due  . ZOSTAVAX  02/05/2015 (Originally 02/01/2001)  . INFLUENZA VACCINE  10/06/2015  . COLONOSCOPY  07/11/2017  . TETANUS/TDAP  04/20/2021  . PNA vac Low Risk Adult  Completed    Physical Exam: Filed Vitals:   01/22/15 1446  BP: 124/70  Pulse: 70  Temp: 97.6 F (36.4 C)  TempSrc: Oral  Resp: 20  Height: '5\' 9"'$  (1.753 m)  Weight: 195 lb 12.8 oz (88.814 kg)  SpO2: 95%   Body mass index is 28.9 kg/(m^2). Physical Exam  Constitutional: He is oriented to person, place, and time. He appears well-developed and well-nourished. No distress.  Cardiovascular: Normal rate, regular rhythm, normal heart sounds and intact distal pulses.   Pulmonary/Chest: Effort normal and breath sounds normal. No respiratory distress.  Abdominal: Soft. Bowel sounds are normal. He exhibits distension. He exhibits no mass. There is no tenderness. A hernia is present.  Musculoskeletal: Normal range of motion. He exhibits no tenderness.  Neurological: He is alert and oriented to person, place, and time.  Skin: Skin is warm and dry.  Upper thoracic area with eschar over sebaceous cyst  Psychiatric: He has a normal mood and affect.    Labs reviewed: Basic Metabolic Panel:  Recent Labs   05/01/14 0812 08/05/14 0811 08/25/14 1352  NA 134 138 140  K 3.6 3.3* 3.5  CL 92* 95* 97  CO2 '24 27 27  '$ GLUCOSE 111* 108* 93  BUN '10 12 12  '$ CREATININE 0.93 1.02 0.85  CALCIUM 9.2 9.3 9.5   Liver Function Tests:  Recent Labs  05/01/14 0812  AST 37  ALT 38  ALKPHOS 76  BILITOT 0.5  PROT 7.3  ALBUMIN 4.0   No results for input(s): LIPASE, AMYLASE in the last 8760 hours. No results for  input(s): AMMONIA in the last 8760 hours. CBC:  Recent Labs  05/01/14 0812 08/05/14 0832  WBC 7.3 8.0  NEUTROABS 4.6 5.8  HGB 16.2  --   HCT 47.7 45.9  MCV 90  --    Lipid Panel:  Recent Labs  05/01/14 0812 08/05/14 0811  CHOL 166 149  HDL 59 53  LDLCALC 87 76  TRIG 98 98  CHOLHDL 2.8 2.8   Lab Results  Component Value Date   HGBA1C 6.2* 08/05/2014   Assessment/Plan 1. Dementia, vascular, without behavioral disturbance -MMSE is 24/30 with deficits in attention/calculation, recall, and failed clock drawing -letter completed indicating cognitive losses as requested for insurance company  2. Essential hypertension -bp at goal with current therapy 124/70  3. Atrial fibrillation with slow ventricular response (HCC) -continues on xarelto therapy  Labs/tests ordered:  No orders of the defined types were placed in this encounter.   Next appt: 03/12/2015  Shonta Bourque L. Rashidi Loh, D.O. [facility] [address], [place] [address] Cell Phone (Mon-Fri 8am-5pm):  [phone] On Call:  [phone] & follow prompts after 5pm & weekends Office Phone:  [phone] Office Fax:  [phone]

## 2015-01-22 NOTE — Telephone Encounter (Signed)
Asked if the letter has a name of medication for his  formulary  Ms Cyndi Bender states she will call back. Samples available for pickup

## 2015-01-23 ENCOUNTER — Encounter: Payer: Self-pay | Admitting: Internal Medicine

## 2015-02-17 ENCOUNTER — Telehealth: Payer: Self-pay

## 2015-02-17 NOTE — Telephone Encounter (Signed)
Prior auth for Xarelto 20mg sent to [facility]. 

## 2015-02-19 ENCOUNTER — Telehealth: Payer: Self-pay

## 2015-02-19 NOTE — Telephone Encounter (Signed)
Xarelto approved through 03/06/2016. PA # [id number].

## 2015-02-24 ENCOUNTER — Other Ambulatory Visit: Payer: Self-pay

## 2015-02-24 MED ORDER — RIVAROXABAN 20 MG PO TABS: 20.0000 mg | ORAL_TABLET | Freq: Every day | ORAL | Status: DC

## 2015-03-04 ENCOUNTER — Other Ambulatory Visit: Payer: Medicare Other

## 2015-03-04 DIAGNOSIS — I4891 Unspecified atrial fibrillation: Secondary | ICD-10-CM

## 2015-03-04 DIAGNOSIS — R739 Hyperglycemia, unspecified: Secondary | ICD-10-CM | POA: Diagnosis not present

## 2015-03-04 DIAGNOSIS — E785 Hyperlipidemia, unspecified: Secondary | ICD-10-CM | POA: Diagnosis not present

## 2015-03-05 LAB — CBC WITH DIFFERENTIAL/PLATELET
Basophils Absolute: 0 10*3/uL (ref 0.0–0.2)
Basos: 0 %
EOS (ABSOLUTE): 0.1 10*3/uL (ref 0.0–0.4)
Eos: 2 %
Hematocrit: 47.6 % (ref 37.5–51.0)
Hemoglobin: 16.1 g/dL (ref 12.6–17.7)
Immature Grans (Abs): 0 10*3/uL (ref 0.0–0.1)
Immature Granulocytes: 0 %
Lymphocytes Absolute: 1.5 10*3/uL (ref 0.7–3.1)
Lymphs: 21 %
MCH: 30.8 pg (ref 26.6–33.0)
MCHC: 33.8 g/dL (ref 31.5–35.7)
MCV: 91 fL (ref 79–97)
Monocytes Absolute: 0.6 10*3/uL (ref 0.1–0.9)
Monocytes: 9 %
Neutrophils Absolute: 5.1 10*3/uL (ref 1.4–7.0)
Neutrophils: 68 %
Platelets: 263 10*3/uL (ref 150–379)
RBC: 5.23 x10E6/uL (ref 4.14–5.80)
RDW: 13.8 % (ref 12.3–15.4)
WBC: 7.4 10*3/uL (ref 3.4–10.8)

## 2015-03-05 LAB — HEMOGLOBIN A1C
Est. average glucose Bld gHb Est-mCnc: 126 mg/dL
Hgb A1c MFr Bld: 6 % — ABNORMAL HIGH (ref 4.8–5.6)

## 2015-03-05 LAB — COMPREHENSIVE METABOLIC PANEL
ALT: 35 IU/L (ref 0–44)
AST: 37 IU/L (ref 0–40)
Albumin/Globulin Ratio: 1.3 (ref 1.1–2.5)
Albumin: 4 g/dL (ref 3.5–4.8)
Alkaline Phosphatase: 75 IU/L (ref 39–117)
BUN/Creatinine Ratio: 15 (ref 10–22)
BUN: 14 mg/dL (ref 8–27)
Bilirubin Total: 0.5 mg/dL (ref 0.0–1.2)
CO2: 27 mmol/L (ref 18–29)
Calcium: 9.3 mg/dL (ref 8.6–10.2)
Chloride: 91 mmol/L — ABNORMAL LOW (ref 96–106)
Creatinine, Ser: 0.91 mg/dL (ref 0.76–1.27)
GFR calc Af Amer: 96 mL/min/{1.73_m2} (ref >59)
GFR calc non Af Amer: 83 mL/min/{1.73_m2} (ref >59)
Globulin, Total: 3.2 g/dL (ref 1.5–4.5)
Glucose: 91 mg/dL (ref 65–99)
Potassium: 3.4 mmol/L — ABNORMAL LOW (ref 3.5–5.2)
Sodium: 135 mmol/L (ref 134–144)
Total Protein: 7.2 g/dL (ref 6.0–8.5)

## 2015-03-05 LAB — LIPID PANEL
Chol/HDL Ratio: 2.7 ratio units (ref 0.0–5.0)
Cholesterol, Total: 155 mg/dL (ref 100–199)
HDL: 58 mg/dL (ref >39)
LDL Calculated: 75 mg/dL (ref 0–99)
Triglycerides: 108 mg/dL (ref 0–149)
VLDL Cholesterol Cal: 22 mg/dL (ref 5–40)

## 2015-03-06 ENCOUNTER — Ambulatory Visit: Payer: Medicare Other | Admitting: Internal Medicine

## 2015-03-12 ENCOUNTER — Ambulatory Visit (INDEPENDENT_AMBULATORY_CARE_PROVIDER_SITE_OTHER): Payer: Medicare Other | Admitting: Internal Medicine

## 2015-03-12 ENCOUNTER — Encounter: Payer: Self-pay | Admitting: Internal Medicine

## 2015-03-12 VITALS — BP 116/86 | HR 70 | Temp 97.9°F | Resp 20 | Ht 69.0 in | Wt 196.4 lb

## 2015-03-12 DIAGNOSIS — I4891 Unspecified atrial fibrillation: Secondary | ICD-10-CM

## 2015-03-12 DIAGNOSIS — E663 Overweight: Secondary | ICD-10-CM

## 2015-03-12 DIAGNOSIS — F0150 Vascular dementia without behavioral disturbance: Secondary | ICD-10-CM

## 2015-03-12 DIAGNOSIS — I10 Essential (primary) hypertension: Secondary | ICD-10-CM | POA: Diagnosis not present

## 2015-03-12 DIAGNOSIS — E785 Hyperlipidemia, unspecified: Secondary | ICD-10-CM

## 2015-03-12 DIAGNOSIS — R739 Hyperglycemia, unspecified: Secondary | ICD-10-CM

## 2015-03-12 NOTE — Progress Notes (Signed)
Patient ID: Steven Krause, male   DOB: 28-Nov-1940, 75 y.o.   MRN: [id number]   Location: [place] Provider: Rexene Edison. Mariea Clonts, D.O., C.M.D.  Code Status: DNR Goals of Care: Advanced Directive information Does patient have an advance directive?: No, Would patient like information on creating an advanced directive?: Yes - Educational materials given Again reviewed importance of getting this documentation on file.   Chief Complaint  Patient presents with  . Medical Management of Chronic Issues    3 month follow-up for medical management, Labs printed    HPI: Patient is a 75 y.o. male seen in the office today for med mgt of chronic diseases.    He reports he is doing well.    BP is at goal today finally.  Continues amlodipine.    Still smoking pipe.  Has been smoking less due to cost.  Says if it keeps going up, he'll be forced to quit.  Teeth are still rotting out.    Afib:  No problems with palpitations.  Bruising a lot.  Some scabs from bumping a little with the xarelto.    Breathing well.  Ankle swelling has gone back down--using his aldactone and lasix.  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 Does rest afterward.    Pain:  Abdomen does still sometimes hurt.  Takes the norco and that helps him.  Also has some neck arthritis and into his shoulder on the left.  Gets better with a belch.  Having regular bms.    Hyperlipidemia: cont zetia.  Did not tolerate statins due to leg cramps.  Levels are ok with LDL 75, TG 108  Hyperglycemia:  Diet and exercise discussed.  Has been avoiding sweets now.    Review of Systems:  Review of Systems  Constitutional: Negative for fever, chills, weight loss and malaise/fatigue.  HENT: Negative for congestion.   Respiratory: Negative for shortness of breath.   Cardiovascular: Negative for palpitations and leg swelling.  Gastrointestinal: Positive for abdominal pain.  Genitourinary: Negative for dysuria.    Musculoskeletal: Positive for back pain. Negative for falls.  Skin: Negative for rash.  Neurological: Negative for weakness.  Endo/Heme/Allergies: Bruises/bleeds easily.       Prediabetic    Past Medical History  Diagnosis Date  . Acquired polycythemia 01/05/2011  . Iron excess 01/05/2011  . Hypertension   . Allergy   . Memory loss   . Unspecified hearing loss   . Hypopotassemia   . Other specified cardiac dysrhythmias(427.89)   . Polycythemia, secondary   . 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 Unspecified disorder of kidney and ureter   . Tobacco use disorder   . Hernia of unspecified site of abdominal cavity without mention of obstruction or gangrene   . Unspecified disorder of skin and subcutaneous tissue   . Dizziness and giddiness   . Diarrhea   . Abdominal pain, generalized   . Systemic hypertension     Past Surgical History  Procedure Laterality Date  . Cholecystectomy    . Ventral hernia repair    . Inflamed colon    . US echocardiography  11/03/09    EF 50-55%,  . Nm myocar perf wall motion  8//30/11    normal    Allergies  Allergen Reactions  . Statins       Medication List       This list is accurate as of: 03/12/15  3:25 PM.  Always use your most recent med list.  amLODipine 10 MG tablet  Commonly known as:  NORVASC  TAKE 1 TABLET BY MOUTH EVERY DAY     DULCOLAX PO  Take by mouth as needed.     furosemide 40 MG tablet  Commonly known as:  LASIX  TAKE 1 TABLET IN THE MORNING AND TAKE 1 TABLET IN THE EVENING     HYDROcodone-acetaminophen 5-325 MG tablet  Commonly known as:  NORCO  Take 1 tablet by mouth every 6 (six) hours as needed for moderate pain.     rivaroxaban 20 MG Tabs tablet  Commonly known as:  XARELTO  Take 1 tablet (20 mg total) by mouth daily with supper.     spironolactone 25 MG tablet  Commonly known as:  ALDACTONE  Take 1 tablet (25 mg total) by mouth daily.     tamsulosin 0.4 MG Caps capsule  Commonly known  as:  FLOMAX  TAKE ONE CAPSULE BY MOUTH ONCE DAILY     ZETIA 10 MG tablet  Generic drug:  ezetimibe  TAKE 1 TABLET BY MOUTH EVERY DAY        Health Maintenance  Topic Date Due  . ZOSTAVAX  02/01/2001  . INFLUENZA VACCINE  10/06/2015  . COLONOSCOPY  07/11/2017  . TETANUS/TDAP  04/20/2021  . PNA vac Low Risk Adult  Completed    Physical Exam: Filed Vitals:   03/12/15 1435  BP: 116/86  Pulse: 70  Temp: 97.9 F (36.6 C)  TempSrc: Oral  Resp: 20  Height: '5\' 9"'$  (1.753 m)  Weight: 196 lb 6.4 oz (89.086 kg)  SpO2: 93%   Body mass index is 28.99 kg/(m^2). Physical Exam  Constitutional: He is oriented to person, place, and time. He appears well-developed and well-nourished. No distress.  HENT:  Very poor dentition  Cardiovascular:  irreg irreg  Pulmonary/Chest: Effort normal and breath sounds normal.  Abdominal: Soft. Bowel sounds are normal. He exhibits distension. A hernia is present.  Large ventral hernia and distortion of abdomen--protrudes on right side greater than left  Musculoskeletal: Normal range of motion.  Neurological: He is alert and oriented to person, place, and time.  Psychiatric: He has a normal mood and affect.    Labs reviewed: Basic Metabolic Panel:  Recent Labs  08/05/14 0811 08/25/14 1352 03/04/15 0829  NA 138 140 135  K 3.3* 3.5 3.4*  CL 95* 97 91*  CO2 '27 27 27  '$ GLUCOSE 108* 93 91  BUN '12 12 14  '$ CREATININE 1.02 0.85 0.91  CALCIUM 9.3 9.5 9.3   Liver Function Tests:  Recent Labs  05/01/14 0812 03/04/15 0829  AST 37 37  ALT 38 35  ALKPHOS 76 75  BILITOT 0.5 0.5  PROT 7.3 7.2  ALBUMIN 4.0 4.0   No results for input(s): LIPASE, AMYLASE in the last 8760 hours. No results for input(s): AMMONIA in the last 8760 hours. CBC:  Recent Labs  05/01/14 0812 08/05/14 0832 03/04/15 0829  WBC 7.3 8.0 7.4  NEUTROABS 4.6 5.8 5.1  HGB 16.2  --   --   HCT 47.7 45.9 47.6  MCV 90  --   --    Lipid Panel:  Recent Labs   05/01/14 0812 08/05/14 0811 03/04/15 0829  CHOL 166 149 155  HDL 59 53 58  LDLCALC 87 76 75  TRIG 98 98 108  CHOLHDL 2.8 2.8 2.7   Lab Results  Component Value Date   HGBA1C 6.0* 03/04/2015    Procedures since last visit: No new Assessment/Plan 1. Dementia,  vascular, without behavioral disturbance -mild, suspect his cognitive baseline was not much different, but I have no one who can confirm or deny this (limited education, developmental disability?)  2. Essential hypertension -bp excellent today, cont same regimen  3. Atrial fibrillation with slow ventricular response (HCC) -stable, asymptomatic, cont current regimen including his xarelto; rate slow so no med needed for it  4. Hyperlipidemia LDL goal <100 -cont zetia, didn't tolerate statins--last LDL pretty good at  75 -cost prohibits adding other agents  5. Hyperglycemia -cont to work on diet and walk more -last hba1c 6  6. Overweight (BMI 25.0-29.9) -cont to work on diet and walk more  Labs/tests ordered: just had before visit Next appt:  06/15/15 med mgt  Naylani Bradner L. Shyam Dawson, D.O. [facility] [address], [place] [address] Cell Phone (Mon-Fri 8am-5pm):  [phone] On Call:  [phone] & follow prompts after 5pm & weekends Office Phone:  [phone] Office Fax:  [phone]

## 2015-03-15 ENCOUNTER — Other Ambulatory Visit: Payer: Self-pay | Admitting: Internal Medicine

## 2015-03-22 ENCOUNTER — Encounter: Payer: Self-pay | Admitting: Internal Medicine

## 2015-04-14 ENCOUNTER — Telehealth: Payer: Self-pay | Admitting: *Deleted

## 2015-04-14 MED ORDER — HYDROCODONE-ACETAMINOPHEN 5-325 MG PO TABS: 1.0000 | ORAL_TABLET | Freq: Four times a day (QID) | ORAL | Status: DC | PRN

## 2015-04-14 NOTE — Telephone Encounter (Signed)
Received fax from [facility] # [phone] stating the medication does not need a review. The medication is on members formulary and within quantity limit Steven Krause notified.

## 2015-04-14 NOTE — Telephone Encounter (Signed)
[name], Friend called and stated that patient needs a prior authorization for his Zetia and needs a refill on his pain medication. Initiated a prior authorization for Zetia through Cover My Meds. Key: [id number] Awaiting Determination. Pain Medication Rx printed. Narcotic Contract printed.

## 2015-06-15 ENCOUNTER — Encounter: Payer: Self-pay | Admitting: Internal Medicine

## 2015-06-15 ENCOUNTER — Ambulatory Visit (INDEPENDENT_AMBULATORY_CARE_PROVIDER_SITE_OTHER): Payer: Medicare Other | Admitting: Internal Medicine

## 2015-06-15 VITALS — BP 130/60 | HR 63 | Temp 97.6°F | Ht 69.0 in | Wt 195.0 lb

## 2015-06-15 DIAGNOSIS — D751 Secondary polycythemia: Secondary | ICD-10-CM | POA: Diagnosis not present

## 2015-06-15 DIAGNOSIS — F0150 Vascular dementia without behavioral disturbance: Secondary | ICD-10-CM | POA: Diagnosis not present

## 2015-06-15 DIAGNOSIS — K439 Ventral hernia without obstruction or gangrene: Secondary | ICD-10-CM | POA: Diagnosis not present

## 2015-06-15 DIAGNOSIS — Z23 Encounter for immunization: Secondary | ICD-10-CM | POA: Diagnosis not present

## 2015-06-15 DIAGNOSIS — I10 Essential (primary) hypertension: Secondary | ICD-10-CM | POA: Diagnosis not present

## 2015-06-15 DIAGNOSIS — E785 Hyperlipidemia, unspecified: Secondary | ICD-10-CM

## 2015-06-15 DIAGNOSIS — I4891 Unspecified atrial fibrillation: Secondary | ICD-10-CM

## 2015-06-15 MED ORDER — ZOSTER VACCINE LIVE 19400 UNT/0.65ML ~~LOC~~ SOLR: 0.6500 mL | Freq: Once | SUBCUTANEOUS | Status: DC

## 2015-06-15 MED ORDER — MEMANTINE HCL-DONEPEZIL HCL 7 & 14 & 21 &28 -10 MG PO C4PK: 1.0000 | EXTENDED_RELEASE_CAPSULE | Freq: Every day | ORAL | Status: DC

## 2015-06-15 NOTE — Progress Notes (Signed)
Patient ID: Steven Krause, male   DOB: 09-Sep-1940, 75 y.o.   MRN: [id number]   Location:  [facility] clinic Provider:  Honore Wipperfurth L. Mariea Clonts, D.O., C.M.D.  Code Status: DNR Goals of Care:  Advanced Directives 06/15/2015  Does patient have an advance directive? No  Would patient like information on creating an advanced directive? -  already has friend who is guardian I believe.  Chief Complaint  Patient presents with  . Medical Management of Chronic Issues    3 mths follow-up    HPI: Patient is a 75 y.o. male seen today for medical management of chronic diseases.  Steven Krause is here with him.    Vascular dementia:  His friend would like him on a medication.    Hernias are more painful at times.    He takes his laxatives all the time.  He's very picky about fruit.  Steven Krause get him at least two health meals per week and has had to decrease carbs in there food.  Does drink a lot of water daily out of cooler.    Blood pressure is under control.  Was to be on aspirin for his polycythemia, but he's on xarelto now for his afib, so off the asa.    Past Medical History  Diagnosis Date  . Acquired polycythemia 01/05/2011  . Iron excess 01/05/2011  . Hypertension   . Allergy   . Memory loss   . Unspecified hearing loss   . Hypopotassemia   . Other specified cardiac dysrhythmias(427.89)   . Polycythemia, secondary   . 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 Unspecified disorder of kidney and ureter   . Tobacco use disorder   . Hernia of unspecified site of abdominal cavity without mention of obstruction or gangrene   . Unspecified disorder of skin and subcutaneous tissue   . Dizziness and giddiness   . Diarrhea   . Abdominal pain, generalized   . Systemic hypertension     Past Surgical History  Procedure Laterality Date  . Cholecystectomy    . Ventral hernia repair    . Inflamed colon    . US echocardiography  11/03/09    EF 50-55%,  . Nm myocar perf wall motion  8//30/11    normal    Allergies    Allergen Reactions  . Statins       Medication List       This list is accurate as of: 06/15/15  3:02 PM.  Always use your most recent med list.               amLODipine 10 MG tablet  Commonly known as:  NORVASC  TAKE 1 TABLET BY MOUTH EVERY DAY     DULCOLAX PO  Take by mouth as needed.     furosemide 40 MG tablet  Commonly known as:  LASIX  TAKE 1 TABLET IN THE MORNING AND TAKE 1 TABLET IN THE EVENING     HYDROcodone-acetaminophen 5-325 MG tablet  Commonly known as:  NORCO  Take 1 tablet by mouth every 6 (six) hours as needed for moderate pain.     rivaroxaban 20 MG Tabs tablet  Commonly known as:  XARELTO  Take 1 tablet (20 mg total) by mouth daily with supper.     spironolactone 25 MG tablet  Commonly known as:  ALDACTONE  Take 1 tablet (25 mg total) by mouth daily.     tamsulosin 0.4 MG Caps capsule  Commonly known as:  FLOMAX  TAKE ONE CAPSULE BY MOUTH  ONCE DAILY     ZETIA 10 MG tablet  Generic drug:  ezetimibe  TAKE 1 TABLET BY MOUTH EVERY DAY        Review of Systems:  Review of Systems  Constitutional: Negative for fever and chills.  HENT: Negative for congestion.        Terrible dentition and halitosis  Eyes: Negative for blurred vision.  Respiratory: Negative for shortness of breath.   Cardiovascular: Negative for chest pain, palpitations and leg swelling.  Gastrointestinal: Positive for abdominal pain. Negative for diarrhea, constipation, blood in stool and melena.       His friend, Steven Krause, reports that he's c/o increased abdominal pain from his multiple hernias  Genitourinary: Negative for dysuria.  Musculoskeletal: Negative for falls.  Skin: Negative for itching and rash.  Neurological: Negative for dizziness and weakness.  Psychiatric/Behavioral: Positive for memory loss.    Health Maintenance  Topic Date Due  . ZOSTAVAX  02/01/2001  . INFLUENZA VACCINE  10/06/2015  . COLONOSCOPY  07/11/2017  . TETANUS/TDAP  04/20/2021  . PNA vac  Low Risk Adult  Completed    Physical Exam: Filed Vitals:   06/15/15 1420  BP: 130/60  Pulse: 63  Temp: 97.6 F (36.4 C)  TempSrc: Oral  Height: '5\' 9"'$  (1.753 m)  Weight: 195 lb (88.451 kg)  SpO2: 97%   Body mass index is 28.78 kg/(m^2). Physical Exam  Constitutional: He appears well-developed. No distress.  Cardiovascular:  irreg irreg  Pulmonary/Chest: Effort normal and breath sounds normal. No respiratory distress. He has no wheezes.  Abdominal: Soft. Bowel sounds are normal. He exhibits distension. There is no tenderness. There is no rebound and no guarding. A hernia is present.  Multiple hernias of ventral wall  Neurological: He is alert.  Skin: Skin is warm and dry.  Psychiatric: He has a normal mood and affect.    Labs reviewed: Basic Metabolic Panel:  Recent Labs  08/05/14 0811 08/25/14 1352 03/04/15 0829  NA 138 140 135  K 3.3* 3.5 3.4*  CL 95* 97 91*  CO2 '27 27 27  '$ GLUCOSE 108* 93 91  BUN '12 12 14  '$ CREATININE 1.02 0.85 0.91  CALCIUM 9.3 9.5 9.3   Liver Function Tests:  Recent Labs  03/04/15 0829  AST 37  ALT 35  ALKPHOS 75  BILITOT 0.5  PROT 7.2  ALBUMIN 4.0   No results for input(s): LIPASE, AMYLASE in the last 8760 hours. No results for input(s): AMMONIA in the last 8760 hours. CBC:  Recent Labs  08/05/14 0832 03/04/15 0829  WBC 8.0 7.4  NEUTROABS 5.8 5.1  HCT 45.9 47.6  MCV 90 91  PLT 300 263   Lipid Panel:  Recent Labs  08/05/14 0811 03/04/15 0829  CHOL 149 155  HDL 53 58  LDLCALC 76 75  TRIG 98 108  CHOLHDL 2.8 2.7   Lab Results  Component Value Date   HGBA1C 6.0* 03/04/2015    Assessment/Plan 1. Ventral hernia without obstruction or gangrene - has 3 of these, it appears - one time several years ago, he saw Dr. Grandville Silos for evaluation, but his friend reports that surgery was not an option then due to uncontrolled blood pressure and tobacco abuse -bp now controlled, tobacco abuse is still ongoing and pt has no  plans to quit -hernias are enlarging and he's been complaining of more discomfort in them plus difficulties with constipation (friend reports daily laxative use) -I suggested he see surgery again for another appt since it's  been at least 4-6 years since the last one  - Ambulatory referral to General Surgery  2. Atrial fibrillation with slow ventricular response ([place])- he is on xarelto for this, rate is slow at 27 - agree with xarelto for this in his case, certainly not coumadin with his adherence history, but, only question is whether this will provide a similar/adequate benefit for his polycythemia also (he'd been started on aspirin for it by Dr. Alvy Bimler when she saw him in consultation)--if so, would cont xarelto; if not, I'm hesitant to have him on both asa and xarelto  -will copy Dr. Loletha Grayer and Dr. Alvy Bimler for opinions on this - CBC with Differential/Platelet - Basic metabolic panel  3. Dementia, vascular, without behavioral disturbance - Steven Krause requests he be started on medication for his dementia due to worsening memory loss and increasing functional needs - Memantine HCl-Donepezil HCl (NAMZARIC) 7 & 14 & 21 &28 -10 MG C4PK; Take 1 capsule by mouth daily. One tablet daily until complete, then call for Rx for 28/'10mg'$  prescription  Dispense: 28 each; Refill: 0 -advised them to call us when he reaches the end of the titration pack if it goes smoothly so 28/'10mg'$  namzaric pills #30 can be called into his pharmacy for ongoing use  4. Essential hypertension -bp at goal today, cont amlodipine, furosemide, aldactone, f/u labs: - CBC with Differential/Platelet - Basic metabolic panel  5. Hyperlipidemia LDL goal <100 -LDL at goal at 75 in december  6. Polycythemia, secondary -previously seen by hematology and aspirin daily was recommended -since then, he developed afib and was started on xarelto in place of aspirin -need hematology opinion on whether xarelto is adequate in place of the aspirin as I'm  hesitant to have him on two blood thinners   7. Need for zoster vaccination - zoster vaccine live, PF, (ZOSTAVAX) 67544 UNT/0.65ML injection; Inject 19,400 Units into the skin once.  Dispense: 1 each; Refill: 0  Labs/tests ordered:   Orders Placed This Encounter  Procedures  . CBC with Differential/Platelet  . Basic metabolic panel  . Ambulatory referral to General Surgery    Referral Priority:  Routine    Referral Type:  Surgical    Referral Reason:  Specialty Services Required    Requested Specialty:  General Surgery    Number of Visits Requested:  1    Next appt:  09/21/2015 [facility] [place]. Ayane Delancey, D.O. [facility] [address], [place] [address] Cell Phone (Mon-Fri 8am-5pm):  [phone] On Call:  [phone] & follow prompts after 5pm & weekends Office Phone:  [phone] Office Fax:  [phone]

## 2015-06-16 ENCOUNTER — Encounter: Payer: Self-pay | Admitting: Internal Medicine

## 2015-06-16 LAB — BASIC METABOLIC PANEL
BUN/Creatinine Ratio: 13 (ref 10–24)
BUN: 11 mg/dL (ref 8–27)
CO2: 27 mmol/L (ref 18–29)
Calcium: 9.4 mg/dL (ref 8.6–10.2)
Chloride: 94 mmol/L — ABNORMAL LOW (ref 96–106)
Creatinine, Ser: 0.88 mg/dL (ref 0.76–1.27)
GFR calc Af Amer: 98 mL/min/{1.73_m2} (ref >59)
GFR calc non Af Amer: 85 mL/min/{1.73_m2} (ref >59)
Glucose: 106 mg/dL — ABNORMAL HIGH (ref 65–99)
Potassium: 3.6 mmol/L (ref 3.5–5.2)
Sodium: 138 mmol/L (ref 134–144)

## 2015-06-16 LAB — CBC WITH DIFFERENTIAL/PLATELET
Basophils Absolute: 0.1 10*3/uL (ref 0.0–0.2)
Basos: 1 %
EOS (ABSOLUTE): 0.4 10*3/uL (ref 0.0–0.4)
Eos: 5 %
Hematocrit: 45.9 % (ref 37.5–51.0)
Hemoglobin: 15.7 g/dL (ref 12.6–17.7)
Immature Grans (Abs): 0 10*3/uL (ref 0.0–0.1)
Immature Granulocytes: 0 %
Lymphocytes Absolute: 1.2 10*3/uL (ref 0.7–3.1)
Lymphs: 16 %
MCH: 31 pg (ref 26.6–33.0)
MCHC: 34.2 g/dL (ref 31.5–35.7)
MCV: 91 fL (ref 79–97)
Monocytes Absolute: 0.8 10*3/uL (ref 0.1–0.9)
Monocytes: 11 %
Neutrophils Absolute: 4.9 10*3/uL (ref 1.4–7.0)
Neutrophils: 67 %
Platelets: 332 10*3/uL (ref 150–379)
RBC: 5.06 x10E6/uL (ref 4.14–5.80)
RDW: 13.5 % (ref 12.3–15.4)
WBC: 7.4 10*3/uL (ref 3.4–10.8)

## 2015-06-17 ENCOUNTER — Telehealth: Payer: Self-pay | Admitting: *Deleted

## 2015-06-17 NOTE — Telephone Encounter (Signed)
Spoke with patient's caretaker about his lab results, she stated that she would have a talk with him regarding that  Elevated blood sugar.

## 2015-06-19 NOTE — Progress Notes (Signed)
From my perspective, Xarelto is sufficient. Defer to Dr. Alvy Bimler regarding the benefit of added low dose ASA. Sanda Klein, MD

## 2015-06-22 ENCOUNTER — Other Ambulatory Visit: Payer: Self-pay | Admitting: Internal Medicine

## 2015-06-30 ENCOUNTER — Telehealth: Payer: Self-pay | Admitting: *Deleted

## 2015-06-30 ENCOUNTER — Telehealth: Payer: Self-pay | Admitting: Hematology and Oncology

## 2015-06-30 NOTE — Telephone Encounter (Signed)
s.w. pt and advised on new appt per pt request....pt ok and aware of new d.t

## 2015-06-30 NOTE — Telephone Encounter (Signed)
Let's discontinue namzaric.  We can consider trying just namenda XR w/o the aricept which is probably what's causing his nausea and diarrhea--we'll do that at his next appt if they want.

## 2015-06-30 NOTE — Telephone Encounter (Signed)
[name], Friend called and stated that the memory medication patient was placed on at last visit is upsetting his stomach, causing diarrhea, Nausea and doesn't feel right. [name] had patient stop the medication but she wants to know what she should do from this point. Please Advise.

## 2015-07-01 NOTE — Telephone Encounter (Signed)
[name] Notified.

## 2015-07-13 ENCOUNTER — Telehealth: Payer: Self-pay | Admitting: Cardiovascular Disease

## 2015-07-13 ENCOUNTER — Ambulatory Visit: Payer: Self-pay | Admitting: Cardiovascular Disease

## 2015-07-17 NOTE — Telephone Encounter (Signed)
Closed Encounter  °

## 2015-08-18 ENCOUNTER — Telehealth: Payer: Self-pay | Admitting: Cardiovascular Disease

## 2015-08-20 ENCOUNTER — Ambulatory Visit: Payer: Self-pay | Admitting: Cardiovascular Disease

## 2015-08-20 NOTE — Telephone Encounter (Signed)
Closed encounter °

## 2015-08-21 ENCOUNTER — Other Ambulatory Visit (HOSPITAL_BASED_OUTPATIENT_CLINIC_OR_DEPARTMENT_OTHER): Payer: Medicare Other

## 2015-08-21 ENCOUNTER — Telehealth: Payer: Self-pay | Admitting: Hematology and Oncology

## 2015-08-21 ENCOUNTER — Encounter: Payer: Self-pay | Admitting: Hematology and Oncology

## 2015-08-21 ENCOUNTER — Ambulatory Visit (HOSPITAL_BASED_OUTPATIENT_CLINIC_OR_DEPARTMENT_OTHER): Payer: Medicare Other | Admitting: Hematology and Oncology

## 2015-08-21 VITALS — BP 153/88 | HR 60 | Temp 98.3°F | Resp 18 | Ht 69.0 in | Wt 193.4 lb

## 2015-08-21 DIAGNOSIS — D751 Secondary polycythemia: Secondary | ICD-10-CM | POA: Diagnosis not present

## 2015-08-21 DIAGNOSIS — F17200 Nicotine dependence, unspecified, uncomplicated: Secondary | ICD-10-CM

## 2015-08-21 DIAGNOSIS — I482 Chronic atrial fibrillation, unspecified: Secondary | ICD-10-CM

## 2015-08-21 LAB — CBC WITH DIFFERENTIAL/PLATELET
BASO%: 0.8 % (ref 0.0–2.0)
BASOS ABS: 0.1 10*3/uL (ref 0.0–0.1)
EOS%: 1.3 % (ref 0.0–7.0)
Eosinophils Absolute: 0.1 10*3/uL (ref 0.0–0.5)
HEMATOCRIT: 45.1 % (ref 38.4–49.9)
HGB: 15 g/dL (ref 13.0–17.1)
LYMPH#: 1.1 10*3/uL (ref 0.9–3.3)
LYMPH%: 14.3 % (ref 14.0–49.0)
MCH: 30.5 pg (ref 27.2–33.4)
MCHC: 33.2 g/dL (ref 32.0–36.0)
MCV: 92 fL (ref 79.3–98.0)
MONO#: 0.9 10*3/uL (ref 0.1–0.9)
MONO%: 11 % (ref 0.0–14.0)
NEUT#: 5.7 10*3/uL (ref 1.5–6.5)
NEUT%: 72.6 % (ref 39.0–75.0)
PLATELETS: 274 10*3/uL (ref 140–400)
RBC: 4.9 10*6/uL (ref 4.20–5.82)
RDW: 13.5 % (ref 11.0–14.6)
WBC: 7.9 10*3/uL (ref 4.0–10.3)

## 2015-08-21 LAB — FERRITIN: Ferritin: 25 ng/ml (ref 22–316)

## 2015-08-21 NOTE — Assessment & Plan Note (Signed)
I spent some time counseling the patient the importance of tobacco cessation. He is currently not interested to quit now.  

## 2015-08-21 NOTE — Telephone Encounter (Signed)
Gave and printed avs.Sent to lab

## 2015-08-21 NOTE — Assessment & Plan Note (Addendum)
Repeat CBC show no significant signs of erythrocytosis. He does not need phlebotomy. I recommend him to quit smoking but the patient declined The cause of the erythrocytosis in the past was due to smoking. He does not need future return appointment

## 2015-08-21 NOTE — Progress Notes (Signed)
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  I reviewed the patient's records extensive and collaborated the history with the patient. Summary of his history is as follows: This patient was noted to have chronic erythrocytosis from tobacco use. Prior testing for myeloproliferative disorder with JAK2 mutation and serum erythropoietin level were normal. He had multiple phlebotomies sessions and was being observed. He denies diagnosis of blood clot or stroke. He has no symptoms. The patient developed atrial fibrillation and was placed on Xarelto INTERVAL HISTORY: Steven Krause 75 y.o. male returns for further follow-up. He feels well. Denies any recent diagnosis of blood clots. He continues to smoke The patient denies any recent signs or symptoms of bleeding such as spontaneous epistaxis, hematuria or hematochezia.  I reviewed the past medical history, past surgical history, social history and family history with the patient and they are unchanged from previous note.  ALLERGIES:  is allergic to statins.  MEDICATIONS:  Current Outpatient Prescriptions  Medication Sig Dispense Refill  . amLODipine (NORVASC) 10 MG tablet TAKE 1 TABLET BY MOUTH EVERY DAY 30 tablet 5  . Bisacodyl (DULCOLAX PO) Take by mouth as needed.    . furosemide (LASIX) 40 MG tablet TAKE 1 TABLET IN THE MORNING AND TAKE 1 TABLET IN THE EVENING 60 tablet 5  . HYDROcodone-acetaminophen (NORCO) 5-325 MG tablet Take 1 tablet by mouth every 6 (six) hours as needed for moderate pain. 30 tablet 0  . Memantine HCl-Donepezil HCl (NAMZARIC) 7 & 14 & 21 &28 -10 MG C4PK Take 1 capsule by mouth daily. One tablet daily until complete, then call for Rx for 28/'10mg'$  prescription 28 each 0  . rivaroxaban (XARELTO) 20  MG TABS tablet Take 1 tablet (20 mg total) by mouth daily with supper. 30 tablet 6  . spironolactone (ALDACTONE) 25 MG tablet TAKE 1 TABLET (25 MG TOTAL) BY MOUTH DAILY. 30 tablet 5  . tamsulosin (FLOMAX) 0.4 MG CAPS capsule TAKE ONE CAPSULE BY MOUTH ONCE DAILY 30 capsule 5  . ZETIA 10 MG tablet TAKE 1 TABLET BY MOUTH EVERY DAY 30 tablet 5  . zoster vaccine live, PF, (ZOSTAVAX) 28366 UNT/0.65ML injection Inject 19,400 Units into the skin once. 1 each 0   No current facility-administered medications for this visit.     REVIEW OF SYSTEMS:   Constitutional: Denies fevers, chills or night sweats Eyes: Denies blurriness of vision Ears, nose, mouth, throat, and face: Denies mucositis or sore throat Respiratory: Denies cough, dyspnea or wheezes Cardiovascular: Denies palpitation, chest discomfort or lower extremity swelling Gastrointestinal:  Denies nausea, heartburn or change in bowel habits Skin: Denies abnormal skin rashes Lymphatics: Denies new lymphadenopathy or easy bruising Neurological:Denies numbness, tingling or new weaknesses Behavioral/Psych: Mood is stable, no new changes  All other systems were reviewed with the patient and are negative.  PHYSICAL EXAMINATION: ECOG PERFORMANCE STATUS: 0 - Asymptomatic  Filed Vitals:   08/21/15 1315  BP: 153/88  Pulse: 60  Temp: 98.3 F (36.8 C)  Resp: 18   Filed Weights   08/21/15 1315  Weight: 193 lb 6.4 oz (87.726 kg)    GENERAL:alert, no distress and comfortable SKIN: skin color, texture, turgor are normal, no rashes or significant lesions EYES: normal, Conjunctiva are pink and non-injected, sclera clear OROPHARYNX:no exudate, no erythema and lips, buccal mucosa, and tongue  normal . Poor dentition is noted  NECK: supple, thyroid normal size, non-tender, without nodularity LYMPH:  no palpable lymphadenopathy in the cervical, axillary or inguinal LUNGS: clear to auscultation and percussion with normal breathing effort HEART:  regular rate & rhythm and no murmurs and no lower extremity edema ABDOMEN:abdomen soft, non-tender and normal bowel sounds Musculoskeletal:no cyanosis of digits and no clubbing  NEURO: alert & oriented x 3 with fluent speech, no focal motor/sensory deficits  LABORATORY DATA:  I have reviewed the data as listed     Component Value Date/Time   NA 138 06/15/2015 1540   NA 137 12/15/2013 1713   NA 140 09/21/2012 1016   NA 141 01/12/2009 1259   K 3.6 06/15/2015 1540   K 3.6 09/21/2012 1016   K 4.0 01/12/2009 1259   CL 94* 06/15/2015 1540   CL 102 01/12/2009 1259   CO2 27 06/15/2015 1540   CO2 28 09/21/2012 1016   CO2 30 01/12/2009 1259   GLUCOSE 106* 06/15/2015 1540   GLUCOSE 150* 12/15/2013 1713   GLUCOSE 105 09/21/2012 1016   GLUCOSE 102 01/12/2009 1259   BUN 11 06/15/2015 1540   BUN 15 12/15/2013 1713   BUN 10.1 09/21/2012 1016   BUN 9 01/12/2009 1259   CREATININE 0.88 06/15/2015 1540   CREATININE 0.9 09/21/2012 1016   CREATININE 0.8 01/12/2009 1259   CALCIUM 9.4 06/15/2015 1540   CALCIUM 9.3 09/21/2012 1016   CALCIUM 9.2 01/12/2009 1259   PROT 7.2 03/04/2015 0829   PROT 8.9* 12/15/2013 1713   PROT 7.6 09/21/2012 1016   PROT 7.2 01/12/2009 1259   ALBUMIN 4.0 03/04/2015 0829   ALBUMIN 3.7 12/15/2013 1713   ALBUMIN 3.4* 09/21/2012 1016   AST 37 03/04/2015 0829   AST 37* 09/21/2012 1016   AST 30 01/12/2009 1259   ALT 35 03/04/2015 0829   ALT 38 09/21/2012 1016   ALT 31 01/12/2009 1259   ALKPHOS 75 03/04/2015 0829   ALKPHOS 74 09/21/2012 1016   ALKPHOS 66 01/12/2009 1259   BILITOT 0.5 03/04/2015 0829   BILITOT 0.8 12/15/2013 1713   BILITOT 0.46 09/21/2012 1016   BILITOT 0.60 01/12/2009 1259   GFRNONAA 85 06/15/2015 1540   GFRAA 98 06/15/2015 1540    No results found for: SPEP, UPEP  Lab Results  Component Value Date   WBC 7.9 08/21/2015   NEUTROABS 5.7 08/21/2015   HGB 15.0 08/21/2015   HCT 45.1 08/21/2015   MCV 92.0 08/21/2015   PLT 274 08/21/2015       Chemistry      Component Value Date/Time   NA 138 06/15/2015 1540   NA 137 12/15/2013 1713   NA 140 09/21/2012 1016   NA 141 01/12/2009 1259   K 3.6 06/15/2015 1540   K 3.6 09/21/2012 1016   K 4.0 01/12/2009 1259   CL 94* 06/15/2015 1540   CL 102 01/12/2009 1259   CO2 27 06/15/2015 1540   CO2 28 09/21/2012 1016   CO2 30 01/12/2009 1259   BUN 11 06/15/2015 1540   BUN 15 12/15/2013 1713   BUN 10.1 09/21/2012 1016   BUN 9 01/12/2009 1259   CREATININE 0.88 06/15/2015 1540   CREATININE 0.9 09/21/2012 1016   CREATININE 0.8 01/12/2009 1259      Component Value Date/Time   CALCIUM 9.4 06/15/2015 1540   CALCIUM 9.3 09/21/2012 1016   CALCIUM 9.2 01/12/2009 1259   ALKPHOS 75 03/04/2015 0829   ALKPHOS 74 09/21/2012 1016   ALKPHOS  66 01/12/2009 1259   AST 37 03/04/2015 0829   AST 37* 09/21/2012 1016   AST 30 01/12/2009 1259   ALT 35 03/04/2015 0829   ALT 38 09/21/2012 1016   ALT 31 01/12/2009 1259   BILITOT 0.5 03/04/2015 0829   BILITOT 0.8 12/15/2013 1713   BILITOT 0.46 09/21/2012 1016   BILITOT 0.60 01/12/2009 1259       ASSESSMENT & PLAN:  Acquired polycythemia Repeat CBC show no significant signs of erythrocytosis. He does not need phlebotomy. I recommend him to quit smoking but the patient declined The cause of the erythrocytosis in the past was due to smoking. He does not need future return appointment  Tobacco use disorder I spent some time counseling the patient the importance of tobacco cessation. He is currently not interested to quit now.   Chronic atrial fibrillation (HCC) The patient is on chronic anticoagulation therapy for atrial fibrillation and stroke prevention. I would defer to them for further management   All questions were answered. The patient knows to call the clinic with any problems, questions or concerns. No barriers to learning was detected.  I spent 10 minutes counseling the patient face to face. The total time spent in the  appointment was 15 minutes and more than 50% was on counseling.     [facility], Lachlan Mckim, MD 6/16/20174:02 PM

## 2015-08-21 NOTE — Assessment & Plan Note (Signed)
The patient is on chronic anticoagulation therapy for atrial fibrillation and stroke prevention. I would defer to them for further management

## 2015-09-21 ENCOUNTER — Ambulatory Visit (INDEPENDENT_AMBULATORY_CARE_PROVIDER_SITE_OTHER): Payer: Medicare Other | Admitting: Internal Medicine

## 2015-09-21 ENCOUNTER — Encounter: Payer: Self-pay | Admitting: Internal Medicine

## 2015-09-21 VITALS — BP 144/80 | HR 66 | Temp 97.9°F | Wt 197.0 lb

## 2015-09-21 DIAGNOSIS — I10 Essential (primary) hypertension: Secondary | ICD-10-CM | POA: Diagnosis not present

## 2015-09-21 DIAGNOSIS — E785 Hyperlipidemia, unspecified: Secondary | ICD-10-CM | POA: Diagnosis not present

## 2015-09-21 DIAGNOSIS — I4891 Unspecified atrial fibrillation: Secondary | ICD-10-CM | POA: Diagnosis not present

## 2015-09-21 DIAGNOSIS — Z23 Encounter for immunization: Secondary | ICD-10-CM | POA: Diagnosis not present

## 2015-09-21 DIAGNOSIS — F0150 Vascular dementia without behavioral disturbance: Secondary | ICD-10-CM

## 2015-09-21 DIAGNOSIS — K439 Ventral hernia without obstruction or gangrene: Secondary | ICD-10-CM

## 2015-09-21 MED ORDER — ZOSTER VACCINE LIVE 19400 UNT/0.65ML ~~LOC~~ SUSR: 0.6500 mL | Freq: Once | SUBCUTANEOUS | Status: DC

## 2015-09-21 NOTE — Progress Notes (Signed)
Location:  [facility] clinic Provider:  Aubryana Vittorio L. Mariea Clonts, D.O., C.M.D.  Goals of Care:  Advanced Directives 09/21/2015  Does patient have an advance directive? No  Type of Advance Directive -  Does patient want to make changes to advanced directive? -  Copy of advanced directive(s) in chart? -  his friend [name] is his POA, but we don't have it scanned  Chief Complaint  Patient presents with  . Medical Management of Chronic Issues    HPI: Patient is a 75 y.o. male seen today for medical management of chronic diseases.    Hypertension:  BP still elevated.  Continues to smoke a pipe.    Hyperlipidemia:  Cannot tolerate statins.  Is on zetia with some benefit.    Overweight:  Does not exercise.    Tobacco abuse:  Continues to smoke a pipe.  Is not interested or willing to quit.    Afib:  Continues on xarelto.  Pulse is low.  CHF:  Shortness of breath and swelling improved.  Weight is up 4-5 lbs.  Says he eating poorly as his friend [name] is ill and cannot cook for him.   Mentions getting swimmy headed on standing some.  Discussed hydration with water in heat (was talking only about tea and coffee drinking, but did say he's drinking from a water cooler when asked).  Ventral hernia:  I referred him to general surgery in April and he had an appointment then.  I do not have a note in epic that's been scanned.    Past Medical History  Diagnosis Date  . Acquired polycythemia 01/05/2011  . Iron excess 01/05/2011  . Hypertension   . Allergy   . Memory loss   . Unspecified hearing loss   . Hypopotassemia   . Other specified cardiac dysrhythmias(427.89)   . Polycythemia, secondary   . 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 Unspecified disorder of kidney and ureter   . Tobacco use disorder   . Hernia of unspecified site of abdominal cavity without mention of obstruction or gangrene   . Unspecified disorder of skin and subcutaneous tissue   . Dizziness and giddiness   . Diarrhea   . Abdominal  pain, generalized   . Systemic hypertension     Past Surgical History  Procedure Laterality Date  . Cholecystectomy    . Ventral hernia repair    . Inflamed colon    . US echocardiography  11/03/09    EF 50-55%,  . Nm myocar perf wall motion  8//30/11    normal    Allergies  Allergen Reactions  . Statins       Medication List       This list is accurate as of: 09/21/15  1:26 PM.  Always use your most recent med list.               amLODipine 10 MG tablet  Commonly known as:  NORVASC  TAKE 1 TABLET BY MOUTH EVERY DAY     DULCOLAX PO  Take by mouth as needed.     furosemide 40 MG tablet  Commonly known as:  LASIX  TAKE 1 TABLET IN THE MORNING AND TAKE 1 TABLET IN THE EVENING     HYDROcodone-acetaminophen 5-325 MG tablet  Commonly known as:  NORCO  Take 1 tablet by mouth every 6 (six) hours as needed for moderate pain.     Memantine HCl-Donepezil HCl 7 & 14 & 21 &28 -10 MG C4pk  Commonly known as:  NAMZARIC  Take 1 capsule by mouth daily. One tablet daily until complete, then call for Rx for 28/'10mg'$  prescription     rivaroxaban 20 MG Tabs tablet  Commonly known as:  XARELTO  Take 1 tablet (20 mg total) by mouth daily with supper.     spironolactone 25 MG tablet  Commonly known as:  ALDACTONE  TAKE 1 TABLET (25 MG TOTAL) BY MOUTH DAILY.     tamsulosin 0.4 MG Caps capsule  Commonly known as:  FLOMAX  TAKE ONE CAPSULE BY MOUTH ONCE DAILY     ZETIA 10 MG tablet  Generic drug:  ezetimibe  TAKE 1 TABLET BY MOUTH EVERY DAY     zoster vaccine live (PF) 19400 UNT/0.65ML injection  Commonly known as:  ZOSTAVAX  Inject 19,400 Units into the skin once.       Review of Systems:  Review of Systems  Constitutional: Negative for fever, chills and malaise/fatigue.  HENT:       Teeth are rotten  Eyes: Negative for blurred vision.  Respiratory: Negative for shortness of breath.   Cardiovascular: Positive for palpitations and leg swelling. Negative for chest pain.        Left greater than right chronic, but mild at present  Gastrointestinal: Negative for abdominal pain, constipation, blood in stool and melena.       Recently no abdominal pain from hernia (had been having more pain and hernia enlargement in April and referral placed to surgery)  Genitourinary: Negative for dysuria.  Musculoskeletal: Positive for back pain. Negative for falls.       Comes and goes  Skin: Negative for rash.  Neurological: Positive for dizziness. Negative for loss of consciousness and weakness.  Psychiatric/Behavioral: Positive for memory loss. Negative for depression. The patient has insomnia. The patient is not nervous/anxious.     Health Maintenance  Topic Date Due  . ZOSTAVAX  02/01/2001  . INFLUENZA VACCINE  10/06/2015  . COLONOSCOPY  07/11/2017  . TETANUS/TDAP  04/20/2021  . PNA vac Low Risk Adult  Completed    Physical Exam: Filed Vitals:   09/21/15 1309  BP: 144/80  Pulse: 66  Temp: 97.9 F (36.6 C)  TempSrc: Oral  Weight: 197 lb (89.359 kg)  SpO2: 95%   Body mass index is 29.08 kg/(m^2). Physical Exam  Constitutional: He is oriented to person, place, and time. He appears well-developed and well-nourished. No distress.  HENT:  Missing many teeth and others are rotten  Cardiovascular:  irreg irreg  Pulmonary/Chest: Effort normal and breath sounds normal. No respiratory distress.  Abdominal: Soft. Bowel sounds are normal. He exhibits distension.  Ventral hernias nontender  Musculoskeletal: Normal range of motion.  Neurological: He is alert and oriented to person, place, and time.  Skin: Skin is warm and dry.    Labs reviewed: Basic Metabolic Panel:  Recent Labs  03/04/15 0829 06/15/15 1540  NA 135 138  K 3.4* 3.6  CL 91* 94*  CO2 27 27  GLUCOSE 91 106*  BUN 14 11  CREATININE 0.91 0.88  CALCIUM 9.3 9.4   Liver Function Tests:  Recent Labs  03/04/15 0829  AST 37  ALT 35  ALKPHOS 75  BILITOT 0.5  PROT 7.2  ALBUMIN 4.0    No results for input(s): LIPASE, AMYLASE in the last 8760 hours. No results for input(s): AMMONIA in the last 8760 hours. CBC:  Recent Labs  03/04/15 0829 06/15/15 1540 08/21/15 1346  WBC 7.4 7.4 7.9  NEUTROABS 5.1 4.9 5.7  HGB  --   --  15.0  HCT 47.6 45.9 45.1  MCV 91 91 92.0  PLT 263 332 274   Lipid Panel:  Recent Labs  03/04/15 0829  CHOL 155  HDL 58  LDLCALC 75  TRIG 108  CHOLHDL 2.7   Lab Results  Component Value Date   HGBA1C 6.0* 03/04/2015    Assessment/Plan 1. Ventral hernia without obstruction or gangrene -unclear what happened with surgery appt (I see one in for Dr. Johney Maine, but no notes and pt says he has not gone)--his friend has been ill so perhaps it was canceled b/c she could not go along?  2. Atrial fibrillation with slow ventricular response (HCC) -stable with xarelto and rate slow so not on any BB or CCB  3. Dementia, vascular, without behavioral disturbance -continues on namzaric--seems he had some intellectual disability at baseline and limited education  4. Essential hypertension -bp elevated upon arrival, but reports he was overheated and cooling down and bp improved to 130s.  5. Hyperlipidemia LDL goal <100 -lipids near goal at 75 so continue zetia as he cannot tolerate statin therapy  6. Need for zoster vaccination - Zoster Vaccine Live, PF, (ZOSTAVAX) 49702 UNT/0.65ML injection; Inject 19,400 Units into the skin once.  Dispense: 1 each; Refill: 0 -advised that he should get his shot today at [facility] asap  Labs/tests ordered:  No orders of the defined types were placed in this encounter.   Next appt:  12/21/2015  Drae Mitzel L. Annalisa Colonna, D.O. [facility] [address], [place] [address] Cell Phone (Mon-Fri 8am-5pm):  [phone] On Call:  [phone] & follow prompts after 5pm & weekends Office Phone:  [phone] Office Fax:  [phone]

## 2015-09-25 ENCOUNTER — Other Ambulatory Visit: Payer: Self-pay | Admitting: Internal Medicine

## 2015-10-06 ENCOUNTER — Other Ambulatory Visit: Payer: Self-pay | Admitting: Internal Medicine

## 2015-10-12 ENCOUNTER — Telehealth: Payer: Self-pay | Admitting: Cardiovascular Disease

## 2015-10-12 NOTE — Telephone Encounter (Signed)
Returned call to [place]. She is the patient's friend. [name] was patient's neighbor who used to help care for the patient and she passed away. The patient said someone used to come pick him up for his appt and take him there. She was asking who arranged this. I advised that the doctor's office does not arrange rides. She thought this was the case. She is trying to help manage his medications and appt's but it is difficult due to patient's dementia. I advised if he could fill out a DPR form with her name we could give her more information.

## 2015-10-12 NOTE — Telephone Encounter (Signed)
[place] Call requesting to speak with RN. Ms. Loletha Grayer States that [name] has passed away and she will be looking after pt now. Ms. Loletha Grayer states karen has a ride scheduled for pt but does not know that information. Ms. Loletha Grayer states pt does not remember who the ride was and wanted to know if th RN may have that information. Please call back to discuss

## 2015-10-19 ENCOUNTER — Encounter: Payer: Self-pay | Admitting: Cardiovascular Disease

## 2015-10-19 ENCOUNTER — Ambulatory Visit (INDEPENDENT_AMBULATORY_CARE_PROVIDER_SITE_OTHER): Payer: Medicare Other | Admitting: Cardiovascular Disease

## 2015-10-19 VITALS — BP 154/76 | HR 56 | Ht 69.0 in | Wt 198.0 lb

## 2015-10-19 DIAGNOSIS — I498 Other specified cardiac arrhythmias: Secondary | ICD-10-CM

## 2015-10-19 DIAGNOSIS — I482 Chronic atrial fibrillation, unspecified: Secondary | ICD-10-CM

## 2015-10-19 DIAGNOSIS — I10 Essential (primary) hypertension: Secondary | ICD-10-CM | POA: Diagnosis not present

## 2015-10-19 DIAGNOSIS — E785 Hyperlipidemia, unspecified: Secondary | ICD-10-CM | POA: Diagnosis not present

## 2015-10-19 DIAGNOSIS — I499 Cardiac arrhythmia, unspecified: Secondary | ICD-10-CM | POA: Diagnosis not present

## 2015-10-19 NOTE — Patient Instructions (Signed)
Dr Croitoru recommends that you schedule a follow-up appointment in 12 months. You will receive a reminder letter in the mail two months in advance. If you don't receive a letter, please call our office to schedule the follow-up appointment.  If you need a refill on your cardiac medications before your next appointment, please call your pharmacy. 

## 2015-10-19 NOTE — Progress Notes (Signed)
Cardiology Office Note    Date:  10/19/2015   ID:  Steven Krause, DOB [birth year], MRN [id number]  PCP:  Hollace Kinnier, DO  Cardiologist:   Sanda Klein, MD   Chief complaint: atrial fibrillation    History of Present Illness:  Steven Krause is a 75 y.o. male with atrial fibrillation with spontaneously slow ventricular response returning for follow-up. He notes occasional weakness and dizziness if he is out in the heat, but otherwise feels well and denies any palpitations, syncope or any change in his pattern of mild ankle swelling. He was prescribed memantine/donepezil for memory problems but took only one tablet before discontinuing it secondary to nausea. He has not had problems shortness of breath or chest discomfort. He quit smoking his pipe since it also caused "an upset stomach". He has not had abdominal pain, nausea or vomiting, change in bowel pattern, overt bleeding. He reports compliance with Xarelto. He denies any focal neurological events.    Past Medical History:  Diagnosis Date  . Abdominal pain, generalized   . Acquired polycythemia 01/05/2011  . Allergy   . Diarrhea   . Dizziness and giddiness   . Hernia of unspecified site of abdominal cavity without mention of obstruction or gangrene   . Hypertension   . Hypopotassemia   . Iron excess 01/05/2011  . Memory loss   . Other specified cardiac dysrhythmias(427.89)   . 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 Polycythemia, secondary   . Systemic hypertension   . Tobacco use disorder   . Unspecified disorder of kidney and ureter   . Unspecified disorder of skin and subcutaneous tissue   . Unspecified hearing loss     Past Surgical History:  Procedure Laterality Date  . CHOLECYSTECTOMY    . INFLAMED COLON    . NM MYOCAR PERF WALL MOTION  8//30/11   normal  . US ECHOCARDIOGRAPHY  11/03/09   EF 50-55%,  . VENTRAL HERNIA REPAIR      Current Medications: Outpatient Medications Prior to Visit  Medication Sig  Dispense Refill  . amLODipine (NORVASC) 10 MG tablet TAKE 1 TABLET BY MOUTH EVERY DAY 30 tablet 5  . Bisacodyl (DULCOLAX PO) Take by mouth as needed.    . furosemide (LASIX) 40 MG tablet TAKE 1 TABLET IN THE MORNING AND TAKE 1 TABLET IN THE EVENING 60 tablet 5  . HYDROcodone-acetaminophen (NORCO) 5-325 MG tablet Take 1 tablet by mouth every 6 (six) hours as needed for moderate pain. 30 tablet 0  . Memantine HCl-Donepezil HCl (NAMZARIC) 7 & 14 & 21 &28 -10 MG C4PK Take 1 capsule by mouth daily. One tablet daily until complete, then call for Rx for 28/'10mg'$  prescription 28 each 0  . rivaroxaban (XARELTO) 20 MG TABS tablet Take 1 tablet (20 mg total) by mouth daily with supper. 30 tablet 6  . spironolactone (ALDACTONE) 25 MG tablet TAKE 1 TABLET (25 MG TOTAL) BY MOUTH DAILY. 30 tablet 5  . tamsulosin (FLOMAX) 0.4 MG CAPS capsule TAKE ONE CAPSULE BY MOUTH ONCE DAILY 30 capsule 5  . ZETIA 10 MG tablet TAKE 1 TABLET BY MOUTH EVERY DAY 30 tablet 5  . ZETIA 10 MG tablet TAKE 1 TABLET BY MOUTH EVERY DAY 30 tablet 5  . Zoster Vaccine Live, PF, (ZOSTAVAX) 38101 UNT/0.65ML injection Inject 19,400 Units into the skin once. 1 each 0   No facility-administered medications prior to visit.      Allergies:   Statins   Social History   Social History  .  Marital status: Single    Spouse name: N/A  . Number of children: N/A  . Years of education: N/A   Social History Main Topics  . Smoking status: Current Every Day Smoker    Packs/day: 0.25    Years: 55.00    Types: Pipe  . Smokeless tobacco: Never Used  . Alcohol use No  . Drug use: No  . Sexual activity: Not Asked   Other Topics Concern  . None   Social History Narrative   Friend [name] is contact and helps with his finances, appts, etc.     Family History:  The patient's family history includes Cancer in his brother; Colon cancer in his mother; Colon polyps (age of onset: 9) in his mother; Diabetes in his sister, sister, and sister; Heart  disease in his father and sister.   ROS:   Please see the history of present illness.    ROS All other systems reviewed and are negative.   PHYSICAL EXAM:   VS:  BP (!) 154/76   Pulse (!) 56   Ht '5\' 9"'$  (1.753 m)   Wt 198 lb (89.8 kg)   BMI 29.24 kg/m    Rechecked 10 minutes later his blood pressure was 138/78 mmHg GEN: Well nourished, well developed, in no acute distress  HEENT: normal  Neck: no JVD, carotid bruits, or masses Cardiac: irregular; no murmurs, rubs, or gallops,no edema  Respiratory:  clear to auscultation bilaterally, normal work of breathing GI: soft, nontender, nondistended, + BS MS: no deformity or atrophy  Skin: warm and dry, no rash Neuro:  Alert and Oriented x 3, Strength and sensation are intact Psych: euthymic mood, full affect  Wt Readings from Last 3 Encounters:  10/19/15 198 lb (89.8 kg)  09/21/15 197 lb (89.4 kg)  08/21/15 193 lb 6.4 oz (87.7 kg)      Studies/Labs Reviewed:   EKG:  EKG is ordered today.  The ekg ordered today demonstrates atrial fibrillation with slow ventricular response and a ventricular rate of 56 bpm, poor R-wave progression, otherwise normal  Recent Labs: 03/04/2015: ALT 35 06/15/2015: BUN 11; Creatinine, Ser 0.88; Potassium 3.6; Sodium 138 08/21/2015: HGB 15.0; Platelets 274   Lipid Panel    Component Value Date/Time   CHOL 155 03/04/2015 0829   TRIG 108 03/04/2015 0829   HDL 58 03/04/2015 0829   CHOLHDL 2.7 03/04/2015 0829   LDLCALC 75 03/04/2015 0829     ASSESSMENT:    1. Chronic atrial fibrillation ([place])   2. Ventricular bigeminy   3. Essential hypertension   4. Hyperlipidemia      PLAN:  In order of problems listed above:  1. AFib: Ventricular response is becoming slower and slower every year. He does not have any overt symptoms of bradycardia, although his intolerance to heat might be a symptom of limited cardiac output. Encouraged him to call immediately should he develop presyncope or syncope or  persistent fatigue or dizziness. Negative chronotropic agents should be avoided. CHADSVasc 2-3 (will increase by one point this November). Continue anticoagulation with Xarelto. Reminded him to take it with food at the same time every day. 2. PVCs: Not obvious on today's evaluation, always asymptomatic 3. HTN: His blood pressure is consistently elevated every time he comes to see [place], but quickly normalizes after he relaxes in the clinic for a while.  4. HLP: Satisfactory lipid profile when checked less than 8 months ago    Medication Adjustments/Labs and Tests Ordered: Current medicines are reviewed at  length with the patient today.  Concerns regarding medicines are outlined above.  Medication changes, Labs and Tests ordered today are listed in the Patient Instructions below. Patient Instructions  Dr Sallyanne Kuster recommends that you schedule a follow-up appointment in 12 months. You will receive a reminder letter in the mail two months in advance. If you don't receive a letter, please call our office to schedule the follow-up appointment.  If you need a refill on your cardiac medications before your next appointment, please call your pharmacy.    Signed, Sanda Klein, MD  10/19/2015 4:59 PM    [facility] [address], [place], [place]  [address] Phone: [phone]; Fax: [phone]

## 2015-10-26 ENCOUNTER — Other Ambulatory Visit: Payer: Self-pay | Admitting: Cardiovascular Disease

## 2015-12-21 ENCOUNTER — Ambulatory Visit: Payer: Medicare Other | Admitting: Internal Medicine

## 2015-12-22 ENCOUNTER — Other Ambulatory Visit: Payer: Self-pay | Admitting: Internal Medicine

## 2015-12-25 ENCOUNTER — Encounter: Payer: Self-pay | Admitting: Internal Medicine

## 2015-12-25 ENCOUNTER — Ambulatory Visit (INDEPENDENT_AMBULATORY_CARE_PROVIDER_SITE_OTHER): Payer: Medicare Other | Admitting: Internal Medicine

## 2015-12-25 VITALS — BP 120/80 | HR 60 | Temp 97.5°F | Wt 196.0 lb

## 2015-12-25 DIAGNOSIS — I4891 Unspecified atrial fibrillation: Secondary | ICD-10-CM

## 2015-12-25 DIAGNOSIS — R739 Hyperglycemia, unspecified: Secondary | ICD-10-CM

## 2015-12-25 DIAGNOSIS — D751 Secondary polycythemia: Secondary | ICD-10-CM | POA: Diagnosis not present

## 2015-12-25 DIAGNOSIS — R600 Localized edema: Secondary | ICD-10-CM

## 2015-12-25 DIAGNOSIS — K439 Ventral hernia without obstruction or gangrene: Secondary | ICD-10-CM

## 2015-12-25 DIAGNOSIS — E876 Hypokalemia: Secondary | ICD-10-CM

## 2015-12-25 DIAGNOSIS — E663 Overweight: Secondary | ICD-10-CM

## 2015-12-25 DIAGNOSIS — R609 Edema, unspecified: Secondary | ICD-10-CM | POA: Diagnosis not present

## 2015-12-25 DIAGNOSIS — I872 Venous insufficiency (chronic) (peripheral): Secondary | ICD-10-CM | POA: Diagnosis not present

## 2015-12-25 DIAGNOSIS — Z23 Encounter for immunization: Secondary | ICD-10-CM

## 2015-12-25 LAB — CBC WITH DIFFERENTIAL/PLATELET
Basophils Absolute: 60 cells/uL (ref 0–200)
Basophils Relative: 1 %
Eosinophils Absolute: 120 cells/uL (ref 15–500)
Eosinophils Relative: 2 %
HCT: 39.2 % (ref 38.5–50.0)
Hemoglobin: 12.9 g/dL — ABNORMAL LOW (ref 13.2–17.1)
Lymphocytes Relative: 15 %
Lymphs Abs: 900 cells/uL (ref 850–3900)
MCH: 29.5 pg (ref 27.0–33.0)
MCHC: 32.9 g/dL (ref 32.0–36.0)
MCV: 89.5 fL (ref 80.0–100.0)
MPV: 10.6 fL (ref 7.5–12.5)
Monocytes Absolute: 600 cells/uL (ref 200–950)
Monocytes Relative: 10 %
Neutro Abs: 4320 cells/uL (ref 1500–7800)
Neutrophils Relative %: 72 %
Platelets: 332 10*3/uL (ref 140–400)
RBC: 4.38 MIL/uL (ref 4.20–5.80)
RDW: 13.7 % (ref 11.0–15.0)
WBC: 6 10*3/uL (ref 3.8–10.8)

## 2015-12-25 LAB — COMPLETE METABOLIC PANEL WITH GFR
ALT: 19 U/L (ref 9–46)
AST: 27 U/L (ref 10–35)
Albumin: 3.6 g/dL (ref 3.6–5.1)
Alkaline Phosphatase: 77 U/L (ref 40–115)
BUN: 12 mg/dL (ref 7–25)
CO2: 31 mmol/L (ref 20–31)
Calcium: 9.1 mg/dL (ref 8.6–10.3)
Chloride: 96 mmol/L — ABNORMAL LOW (ref 98–110)
Creat: 0.98 mg/dL (ref 0.70–1.18)
GFR, Est African American: 87 mL/min (ref >=60)
GFR, Est Non African American: 76 mL/min (ref >=60)
Glucose, Bld: 109 mg/dL — ABNORMAL HIGH (ref 65–99)
Potassium: 3.2 mmol/L — ABNORMAL LOW (ref 3.5–5.3)
Sodium: 137 mmol/L (ref 135–146)
Total Bilirubin: 0.6 mg/dL (ref 0.2–1.2)
Total Protein: 7 g/dL (ref 6.1–8.1)

## 2015-12-25 LAB — HEMOGLOBIN A1C
Hgb A1c MFr Bld: 5.7 % — ABNORMAL HIGH (ref <5.7)
Mean Plasma Glucose: 117 mg/dL

## 2015-12-25 NOTE — Progress Notes (Signed)
Location:  [facility] clinic Provider:  Dallyn Bergland L. Mariea Clonts, D.O., C.M.D.  Code Status: DNR done when his guardian was here Goals of Care:  Advanced Directives 12/25/2015  Does patient have an advance directive? No  Type of Advance Directive -  Does patient want to make changes to advanced directive? -  Copy of advanced directive(s) in chart? -  Would patient like information on creating an advanced directive? No - patient declined information   Chief Complaint  Patient presents with  . Medical Management of Chronic Issues    3 mth follow-up    HPI: Patient is a 75 y.o. male seen today for medical management of chronic diseases.    Left leg is swollen and has water blisters with fluid coming out.  Rubbed alcohol on them.  Put peroxide and antibiotic ointment on it.    He had a vertigo spell when he left here so he never went to get his shingles shot.  Had upset stomach.  Drank a soda and it all went away.  No significant abdominal pain since last visit and bowels are working fine now.    Lost another one of his rotten teeth.    Has two new caregivers who help him to take his medications.    Past Medical History:  Diagnosis Date  . Abdominal pain, generalized   . Acquired polycythemia 01/05/2011  . Allergy   . Diarrhea   . Dizziness and giddiness   . Hernia of unspecified site of abdominal cavity without mention of obstruction or gangrene   . Hypertension   . Hypopotassemia   . Iron excess 01/05/2011  . Memory loss   . Other specified cardiac dysrhythmias(427.89)   . 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 Polycythemia, secondary   . Systemic hypertension   . Tobacco use disorder   . Unspecified disorder of kidney and ureter   . Unspecified disorder of skin and subcutaneous tissue   . Unspecified hearing loss     Past Surgical History:  Procedure Laterality Date  . CHOLECYSTECTOMY    . INFLAMED COLON    . NM MYOCAR PERF WALL MOTION  8//30/11   normal  . US ECHOCARDIOGRAPHY   11/03/09   EF 50-55%,  . VENTRAL HERNIA REPAIR      Allergies  Allergen Reactions  . Statins       Medication List       Accurate as of 12/25/15  8:16 AM. Always use your most recent med list.          amLODipine 10 MG tablet Commonly known as:  NORVASC TAKE 1 TABLET BY MOUTH EVERY DAY   DULCOLAX PO Take by mouth as needed.   furosemide 40 MG tablet Commonly known as:  LASIX TAKE 1 TABLET IN THE MORNING AND TAKE 1 TABLET IN THE EVENING   HYDROcodone-acetaminophen 5-325 MG tablet Commonly known as:  NORCO Take 1 tablet by mouth every 6 (six) hours as needed for moderate pain.   Memantine HCl-Donepezil HCl 7 & 14 & 21 &28 -10 MG C4pk Commonly known as:  NAMZARIC Take 1 capsule by mouth daily. One tablet daily until complete, then call for Rx for 28/'10mg'$  prescription   spironolactone 25 MG tablet Commonly known as:  ALDACTONE TAKE 1 TABLET (25 MG TOTAL) BY MOUTH DAILY.   tamsulosin 0.4 MG Caps capsule Commonly known as:  FLOMAX TAKE ONE CAPSULE BY MOUTH ONCE DAILY   XARELTO 20 MG Tabs tablet Generic drug:  rivaroxaban TAKE 1 TABLET BY  MOUTH EVERY DAY WITH SUPPER   ZETIA 10 MG tablet Generic drug:  ezetimibe TAKE 1 TABLET BY MOUTH EVERY DAY   ZETIA 10 MG tablet Generic drug:  ezetimibe TAKE 1 TABLET BY MOUTH EVERY DAY       Review of Systems:  Review of Systems  Constitutional: Positive for weight loss. Negative for chills, fever and malaise/fatigue.  HENT: Negative for hearing loss.   Eyes: Negative for blurred vision.  Respiratory: Negative for cough and shortness of breath.   Cardiovascular: Positive for leg swelling. Negative for chest pain and palpitations.       Left leg swelling  Gastrointestinal: Positive for diarrhea. Negative for abdominal pain, blood in stool, constipation and melena.  Genitourinary: Negative for dysuria.  Musculoskeletal: Negative for falls and joint pain.  Skin: Negative for itching and rash.       Healed ulcers of left  leg, venous changes  Neurological: Negative for dizziness, loss of consciousness and weakness.  Endo/Heme/Allergies: Bruises/bleeds easily.  Psychiatric/Behavioral: Positive for memory loss.    Health Maintenance  Topic Date Due  . ZOSTAVAX  02/01/2001  . INFLUENZA VACCINE  10/06/2015  . COLONOSCOPY  07/11/2017  . TETANUS/TDAP  04/20/2021  . PNA vac Low Risk Adult  Completed    Physical Exam: Vitals:   12/25/15 0809  BP: 120/80  Pulse: 60  Temp: 97.5 F (36.4 C)  TempSrc: Oral  SpO2: 96%  Weight: 196 lb (88.9 kg)   Body mass index is 28.94 kg/m. Physical Exam  Constitutional: He appears well-developed and well-nourished. No distress.  Cardiovascular:  irreg irreg  Pulmonary/Chest: Effort normal. No respiratory distress. He has wheezes.  Abdominal: Soft. Bowel sounds are normal. He exhibits distension. A hernia is present.  Musculoskeletal: Normal range of motion.  Neurological: He is alert.  Skin: Skin is warm and dry.  Venous insufficiency changes left leg with pitting edema, right no edema, healed ulcers of left anterior shin  Psychiatric: He has a normal mood and affect.    Labs reviewed: Basic Metabolic Panel:  Recent Labs  03/04/15 0829 06/15/15 1540  NA 135 138  K 3.4* 3.6  CL 91* 94*  CO2 27 27  GLUCOSE 91 106*  BUN 14 11  CREATININE 0.91 0.88  CALCIUM 9.3 9.4   Liver Function Tests:  Recent Labs  03/04/15 0829  AST 37  ALT 35  ALKPHOS 75  BILITOT 0.5  PROT 7.2  ALBUMIN 4.0   No results for input(s): LIPASE, AMYLASE in the last 8760 hours. No results for input(s): AMMONIA in the last 8760 hours. CBC:  Recent Labs  03/04/15 0829 06/15/15 1540 08/21/15 1346  WBC 7.4 7.4 7.9  NEUTROABS 5.1 4.9 5.7  HGB  --   --  15.0  HCT 47.6 45.9 45.1  MCV 91 91 92.0  PLT 263 332 274   Lipid Panel:  Recent Labs  03/04/15 0829  CHOL 155  HDL 58  LDLCALC 75  TRIG 108  CHOLHDL 2.7   Lab Results  Component Value Date   HGBA1C 6.0 (H)  03/04/2015    Assessment/Plan 1. 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 Atrial fibrillation with slow ventricular response (HCC) -  Cont xarelto therapy, not on rate control due to  slow rate at baseline - CBC with Differential/Platelet  3. Ventral hernia without obstruction or gangrene - ongoing, now reports he's not having discomfort--he refused to see surgery after referral was placed  4. Polycythemia, secondary - f/u labs: - CBC with Differential/Platelet  5. Overweight (BMI 25.0-29.9) -cont dietary changes with fewer sweets  6. Hypokalemia - COMPLETE METABOLIC PANEL WITH GFR - has been eating bananas regularly and also is on aldactone   7. Hyperglycemia - ongoing based on last labs, will f/u hba1c today - COMPLETE METABOLIC PANEL WITH GFR - Hemoglobin A1c  8. Need for influenza vaccination -given  Labs/tests ordered:   Orders Placed This Encounter  Procedures  . COMPLETE METABOLIC PANEL WITH GFR    [facility]  . CBC with Differential/Platelet  . Hemoglobin A1c   Next appt:  03/28/2016 med mgt  Carlie Solorzano L. Dwight Adamczak, D.O. [facility] [address], [place] [address] Cell Phone (Mon-Fri 8am-5pm):  [phone] On Call:  [phone] & follow prompts after 5pm & weekends Office Phone:  [phone] Office Fax:  [phone]

## 2015-12-25 NOTE — Addendum Note (Signed)
Addended by: Despina Hidden on: 12/25/2015 09:06 AM   Modules accepted: Orders

## 2015-12-25 NOTE — Patient Instructions (Addendum)
Let's get you some compression hose for your left leg.  You should wear them in the daytime and take them off at night.  I've give you a prescription and you can take it to a medical supply store.  You should also elevate your feet when sitting down.  Call me if the ulcerations of the leg return.

## 2015-12-28 ENCOUNTER — Telehealth: Payer: Self-pay | Admitting: *Deleted

## 2015-12-28 MED ORDER — POTASSIUM CHLORIDE CRYS ER 20 MEQ PO TBCR: 20.0000 meq | EXTENDED_RELEASE_TABLET | Freq: Every day | ORAL | 3 refills | Status: DC

## 2015-12-28 NOTE — Telephone Encounter (Signed)
rx sent to pharmacy by e-script  

## 2015-12-28 NOTE — Telephone Encounter (Signed)
-----   Message from Gayland Curry, DO sent at 12/27/2015  7:02 PM EDT ----- Sugar average has improved. Blood counts have dropped since last time.  We will need to monitor that closely.  He usually has high blood counts.  He also has low potassium.  He was saying he's eating bananas regularly.  It looks like he needs to start on kcl 49mq po daily--please send in to his pharmacy.  Also, please ask BRudene Reto call him to get his new emergency contact information.

## 2016-03-14 ENCOUNTER — Other Ambulatory Visit: Payer: Self-pay | Admitting: Internal Medicine

## 2016-03-28 ENCOUNTER — Ambulatory Visit: Payer: Medicare Other | Admitting: Internal Medicine

## 2016-04-11 ENCOUNTER — Ambulatory Visit: Payer: Medicare Other | Admitting: Internal Medicine

## 2016-04-13 ENCOUNTER — Other Ambulatory Visit: Payer: Self-pay | Admitting: Cardiovascular Disease

## 2016-04-13 ENCOUNTER — Other Ambulatory Visit: Payer: Self-pay | Admitting: Internal Medicine

## 2016-04-18 ENCOUNTER — Ambulatory Visit (INDEPENDENT_AMBULATORY_CARE_PROVIDER_SITE_OTHER): Payer: Medicare Other | Admitting: Internal Medicine

## 2016-04-18 ENCOUNTER — Encounter: Payer: Self-pay | Admitting: Internal Medicine

## 2016-04-18 VITALS — BP 120/70 | HR 68 | Temp 98.1°F | Wt 207.0 lb

## 2016-04-18 DIAGNOSIS — H538 Other visual disturbances: Secondary | ICD-10-CM | POA: Diagnosis not present

## 2016-04-18 DIAGNOSIS — I482 Chronic atrial fibrillation, unspecified: Secondary | ICD-10-CM

## 2016-04-18 DIAGNOSIS — D649 Anemia, unspecified: Secondary | ICD-10-CM

## 2016-04-18 DIAGNOSIS — I872 Venous insufficiency (chronic) (peripheral): Secondary | ICD-10-CM | POA: Diagnosis not present

## 2016-04-18 DIAGNOSIS — R05 Cough: Secondary | ICD-10-CM

## 2016-04-18 DIAGNOSIS — F17200 Nicotine dependence, unspecified, uncomplicated: Secondary | ICD-10-CM | POA: Diagnosis not present

## 2016-04-18 DIAGNOSIS — I10 Essential (primary) hypertension: Secondary | ICD-10-CM | POA: Diagnosis not present

## 2016-04-18 DIAGNOSIS — R059 Cough, unspecified: Secondary | ICD-10-CM

## 2016-04-18 DIAGNOSIS — D751 Secondary polycythemia: Secondary | ICD-10-CM

## 2016-04-18 LAB — CBC WITH DIFFERENTIAL/PLATELET
Basophils Absolute: 78 cells/uL (ref 0–200)
Basophils Relative: 1 %
Eosinophils Absolute: 156 cells/uL (ref 15–500)
Eosinophils Relative: 2 %
HCT: 32.7 % — ABNORMAL LOW (ref 38.5–50.0)
Hemoglobin: 10.2 g/dL — ABNORMAL LOW (ref 13.2–17.1)
Lymphocytes Relative: 13 %
Lymphs Abs: 1014 cells/uL (ref 850–3900)
MCH: 26.5 pg — ABNORMAL LOW (ref 27.0–33.0)
MCHC: 31.2 g/dL — ABNORMAL LOW (ref 32.0–36.0)
MCV: 84.9 fL (ref 80.0–100.0)
MPV: 10.1 fL (ref 7.5–12.5)
Monocytes Absolute: 858 cells/uL (ref 200–950)
Monocytes Relative: 11 %
Neutro Abs: 5694 cells/uL (ref 1500–7800)
Neutrophils Relative %: 73 %
Platelets: 386 10*3/uL (ref 140–400)
RBC: 3.85 MIL/uL — ABNORMAL LOW (ref 4.20–5.80)
RDW: 15 % (ref 11.0–15.0)
WBC: 7.8 10*3/uL (ref 3.8–10.8)

## 2016-04-18 LAB — COMPLETE METABOLIC PANEL WITH GFR
ALT: 15 U/L (ref 9–46)
AST: 25 U/L (ref 10–35)
Albumin: 3.5 g/dL — ABNORMAL LOW (ref 3.6–5.1)
Alkaline Phosphatase: 79 U/L (ref 40–115)
BUN: 14 mg/dL (ref 7–25)
CO2: 33 mmol/L — ABNORMAL HIGH (ref 20–31)
Calcium: 8.9 mg/dL (ref 8.6–10.3)
Chloride: 95 mmol/L — ABNORMAL LOW (ref 98–110)
Creat: 1.03 mg/dL (ref 0.70–1.18)
GFR, Est African American: 82 mL/min (ref >=60)
GFR, Est Non African American: 71 mL/min (ref >=60)
Glucose, Bld: 105 mg/dL — ABNORMAL HIGH (ref 65–99)
Potassium: 3.5 mmol/L (ref 3.5–5.3)
Sodium: 137 mmol/L (ref 135–146)
Total Bilirubin: 0.4 mg/dL (ref 0.2–1.2)
Total Protein: 7.2 g/dL (ref 6.1–8.1)

## 2016-04-18 MED ORDER — HYDROCODONE-ACETAMINOPHEN 5-325 MG PO TABS: 1.0000 | ORAL_TABLET | Freq: Four times a day (QID) | ORAL | 0 refills | Status: DC | PRN

## 2016-04-18 MED ORDER — SPIRONOLACTONE 25 MG PO TABS: 25.0000 mg | ORAL_TABLET | Freq: Every day | ORAL | 5 refills | Status: DC

## 2016-04-18 NOTE — Progress Notes (Signed)
Location:  [facility] clinic Provider:  Valeri Sula L. Mariea Clonts, D.O., C.M.D.  Code Status: full code Goals of Care:  Advanced Directives 12/25/2015  Does Patient Have a Medical Advance Directive? No  Type of Advance Directive -  Does patient want to make changes to medical advance directive? -  Copy of [place] in Chart? -  Would patient like information on creating a medical advance directive? No - patient declined information  HCPOA has deceased [name], friend)  Chief Complaint  Patient presents with  . Medical Management of Chronic Issues    [facility] follow-up, left lower leg swelling    HPI: Patient is a 76y.o. male seen today for medical management of chronic diseases.    Could not see out of his right eye on driving test to determine signs.  Needs to see an ophthalmologist.    Left shoulder blade and around his side are painful, thinks it's how he sleeps on it.    Took off compression socks and skin came off.  Having leaking and increased edema after out of aldactone--says he was told insurance did not cover.    He got to coughing so bad and spitting up phlegm that he quit smoking his pipe.    Afib, on xarelto.  Had some anemia last time after having elevated h/h for years.  Notes stools dark but no blood.  Gets fatigued a little more easily.    BP at goal with current therapy.    Past Medical History:  Diagnosis Date  . Abdominal pain, generalized   . Acquired polycythemia 01/05/2011  . Allergy   . Diarrhea   . Dizziness and giddiness   . Hernia of unspecified site of abdominal cavity without mention of obstruction or gangrene   . Hypertension   . Hypopotassemia   . Iron excess 01/05/2011  . Memory loss   . Other specified cardiac dysrhythmias(427.89)   . 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 Polycythemia, secondary   . Systemic hypertension   . Tobacco use disorder   . Unspecified disorder of kidney and ureter   . Unspecified disorder of skin and subcutaneous  tissue   . Unspecified hearing loss     Past Surgical History:  Procedure Laterality Date  . CHOLECYSTECTOMY    . INFLAMED COLON    . NM MYOCAR PERF WALL MOTION  8//30/11   normal  . [place]CHOCARDIOGRAPHY  11/03/09   EF 50-55%,  . VENTRAL HERNIA REPAIR      Allergies  Allergen Reactions  . Statins     Allergies as of 04/18/2016      Reactions   Statins       Medication List       Accurate as of 04/18/16  2:58 PM. Always use your most recent med list.          amLODipine 10 MG tablet Commonly known as:  NORVASC TAKE 1 TABLET BY MOUTH EVERY DAY   furosemide 40 MG tablet Commonly known as:  LASIX TAKE 1 TABLET IN THE MORNING AND TAKE 1 TABLET IN THE EVENING   HYDROcodone-acetaminophen 5-325 MG tablet Commonly known as:  NORCO Take 1 tablet by mouth every 6 (six) hours as needed for moderate pain.   potassium chloride SA 20 MEQ tablet Commonly known as:  K-DUR,KLOR-CON Take 1 tablet (20 mEq total) by mouth daily.   spironolactone 25 MG tablet Commonly known as:  ALDACTONE TAKE 1 TABLET (25 MG TOTAL) BY MOUTH DAILY.   tamsulosin  0.4 MG Caps capsule Commonly known as:  FLOMAX TAKE ONE CAPSULE BY MOUTH ONCE DAILY   XARELTO 20 MG Tabs tablet Generic drug:  rivaroxaban TAKE 1 TABLET BY MOUTH EVERY DAY WITH SUPPER   ZETIA 10 MG tablet Generic drug:  ezetimibe TAKE 1 TABLET BY MOUTH EVERY DAY       Review of Systems:  Review of Systems  Constitutional: Negative for chills, fever and malaise/fatigue.       Gained 7 lbs  HENT: Positive for hearing loss.   Eyes: Positive for blurred vision.       Right  Respiratory: Positive for cough and sputum production. Negative for shortness of breath.        Ongoing tobacco smoker (pipe)  Cardiovascular: Positive for leg swelling. Negative for chest pain and palpitations.       Some drainage from leg   Gastrointestinal: Negative for abdominal pain, blood in stool, constipation, diarrhea and melena.       Hernias  unchanged, still refusing surgical intervention or f/u; has not seen blood or dark stool  Genitourinary: Negative for dysuria.  Musculoskeletal: Positive for joint pain and myalgias.       Shoulder blade and trapezius pain  Skin: Negative for itching and rash.  Neurological: Negative for dizziness and loss of consciousness.  Psychiatric/Behavioral: Negative for depression and memory loss.       Baseline intellectual disability    Health Maintenance  Topic Date Due  . ZOSTAVAX  02/01/2001  . COLONOSCOPY  07/11/2017  . TETANUS/TDAP  04/20/2021  . INFLUENZA VACCINE  Completed  . PNA vac Low Risk Adult  Completed    Physical Exam: Vitals:   04/18/16 1434  BP: 120/70  Pulse: 68  Temp: 98.1 F (36.7 C)  TempSrc: Oral  SpO2: 93%  Weight: 207 lb (93.9 kg)   Body mass index is 30.57 kg/m. Physical Exam  Constitutional: He is oriented to person, place, and time. No distress.  Neck: Neck supple. No JVD present.  Cardiovascular: Intact distal pulses.   irreg irreg  Pulmonary/Chest: Effort normal.  Coarse rhonchorous breath sounds, no wheezing  Abdominal: Soft. Bowel sounds are normal.  Multiple large hernias visible through tshirt,nontender  Musculoskeletal: Normal range of motion. He exhibits tenderness.  Tender around left shoulder blade, trapezius, and rotator cuff, but full ROM present  Neurological: He is alert and oriented to person, place, and time.  Skin:  Ruddy complexion to face, appears paler to me; bilateral LE with stasis dermatitis  Psychiatric: He has a normal mood and affect.    Labs reviewed: Basic Metabolic Panel:  Recent Labs  06/15/15 1540 12/25/15 0845  NA 138 137  K 3.6 3.2*  CL 94* 96*  CO2 27 31  GLUCOSE 106* 109*  BUN 11 12  CREATININE 0.88 0.98  CALCIUM 9.4 9.1   Liver Function Tests:  Recent Labs  12/25/15 0845  AST 27  ALT 19  ALKPHOS 77  BILITOT 0.6  PROT 7.0  ALBUMIN 3.6   No results for input(s): LIPASE, AMYLASE in the  last 8760 hours. No results for input(s): AMMONIA in the last 8760 hours. CBC:  Recent Labs  06/15/15 1540 08/21/15 1346 12/25/15 0845  WBC 7.4 7.9 6.0  NEUTROABS 4.9 5.7 4,320  HGB  --  15.0 12.9*  HCT 45.9 45.1 39.2  MCV 91 92.0 89.5  PLT 332 274 332   Lipid Panel: No results for input(s): CHOL, HDL, LDLCALC, TRIG, CHOLHDL, LDLDIRECT in the last 8760  hours. Lab Results  Component Value Date   HGBA1C 5.7 (H) 12/25/2015   Assessment/Plan 1. Blurry vision, right eye - referral placed to ophtho as he could not pass his driver's test - Ambulatory referral to Ophthalmology  2. Polycythemia, secondary - oddly he is now anemic -concerning on xarelto, check hemoccult and monitor h/h - CBC with Differential/Platelet  3. Tobacco use disorder -smoking less and hoping to quit finally--encouraged him  4. Chronic atrial fibrillation (HCC) -ongoing, cont xarelto for now (monitoring cbc for anemia), rate controlled w/o meds  5. Essential hypertension -bp well controlled today on current regimen - COMPLETE METABOLIC PANEL WITH GFR  6. Chronic venous insufficiency -ongoing, advised to resume compression hose to prevent further ulcerations of legs  - COMPLETE METABOLIC PANEL WITH GFR  7. Cough -due to COPD likely, needs to see pulmonary, is smoking less after his friend died from lung cancer  8. Anemia, unspecified type - f/u cbc, check hemoccult - CBC with Differential/Platelet  Labs/tests ordered:   Orders Placed This Encounter  Procedures  . CBC with Differential/Platelet  . COMPLETE METABOLIC PANEL WITH GFR    [facility]  . Ambulatory referral to Ophthalmology    Referral Priority:   Routine    Referral Type:   Consultation    Referral Reason:   Specialty Services Required    Requested Specialty:   Ophthalmology    Number of Visits Requested:   1  FOBT  Next appt:  07/18/2016   Francyne Arreaga L. Jordann Grime, D.O. [facility] [address], [place] [address] Cell Phone (Mon-Fri 8am-5pm):  [phone] On Call:  [phone] & follow prompts after 5pm & weekends Office Phone:  [phone] Office Fax:  [phone]

## 2016-04-22 ENCOUNTER — Encounter: Payer: Self-pay | Admitting: *Deleted

## 2016-04-23 DIAGNOSIS — D649 Anemia, unspecified: Secondary | ICD-10-CM | POA: Insufficient documentation

## 2016-04-23 DIAGNOSIS — H538 Other visual disturbances: Secondary | ICD-10-CM | POA: Insufficient documentation

## 2016-05-04 ENCOUNTER — Inpatient Hospital Stay (HOSPITAL_COMMUNITY): Payer: Medicare Other

## 2016-05-04 ENCOUNTER — Inpatient Hospital Stay (HOSPITAL_COMMUNITY)
Admission: EM | Admit: 2016-05-04 | Discharge: 2016-05-12 | DRG: 286 | Disposition: A | Discharge status: Home / Self Care | Payer: Medicare Other | Attending: Internal Medicine | Admitting: Internal Medicine

## 2016-05-04 ENCOUNTER — Encounter (HOSPITAL_COMMUNITY): Payer: Self-pay | Admitting: Emergency Medicine

## 2016-05-04 ENCOUNTER — Emergency Department (HOSPITAL_COMMUNITY): Payer: Medicare Other

## 2016-05-04 DIAGNOSIS — I501 Left ventricular failure: Secondary | ICD-10-CM | POA: Diagnosis not present

## 2016-05-04 DIAGNOSIS — I10 Essential (primary) hypertension: Secondary | ICD-10-CM | POA: Diagnosis present

## 2016-05-04 DIAGNOSIS — I481 Persistent atrial fibrillation: Secondary | ICD-10-CM | POA: Diagnosis not present

## 2016-05-04 DIAGNOSIS — I509 Heart failure, unspecified: Secondary | ICD-10-CM

## 2016-05-04 DIAGNOSIS — I25119 Atherosclerotic heart disease of native coronary artery with unspecified angina pectoris: Secondary | ICD-10-CM | POA: Diagnosis present

## 2016-05-04 DIAGNOSIS — Z8249 Family history of ischemic heart disease and other diseases of the circulatory system: Secondary | ICD-10-CM | POA: Diagnosis not present

## 2016-05-04 DIAGNOSIS — D509 Iron deficiency anemia, unspecified: Secondary | ICD-10-CM

## 2016-05-04 DIAGNOSIS — Z79899 Other long term (current) drug therapy: Secondary | ICD-10-CM

## 2016-05-04 DIAGNOSIS — R195 Other fecal abnormalities: Secondary | ICD-10-CM

## 2016-05-04 DIAGNOSIS — F17290 Nicotine dependence, other tobacco product, uncomplicated: Secondary | ICD-10-CM | POA: Diagnosis present

## 2016-05-04 DIAGNOSIS — R079 Chest pain, unspecified: Secondary | ICD-10-CM | POA: Diagnosis present

## 2016-05-04 DIAGNOSIS — R0609 Other forms of dyspnea: Secondary | ICD-10-CM

## 2016-05-04 DIAGNOSIS — K2970 Gastritis, unspecified, without bleeding: Secondary | ICD-10-CM | POA: Diagnosis present

## 2016-05-04 DIAGNOSIS — E8809 Other disorders of plasma-protein metabolism, not elsewhere classified: Secondary | ICD-10-CM | POA: Diagnosis present

## 2016-05-04 DIAGNOSIS — J189 Pneumonia, unspecified organism: Secondary | ICD-10-CM | POA: Diagnosis present

## 2016-05-04 DIAGNOSIS — I482 Chronic atrial fibrillation, unspecified: Secondary | ICD-10-CM | POA: Diagnosis present

## 2016-05-04 DIAGNOSIS — I5031 Acute diastolic (congestive) heart failure: Secondary | ICD-10-CM | POA: Diagnosis not present

## 2016-05-04 DIAGNOSIS — I5041 Acute combined systolic (congestive) and diastolic (congestive) heart failure: Secondary | ICD-10-CM | POA: Diagnosis not present

## 2016-05-04 DIAGNOSIS — R911 Solitary pulmonary nodule: Secondary | ICD-10-CM | POA: Diagnosis present

## 2016-05-04 DIAGNOSIS — I208 Other forms of angina pectoris: Secondary | ICD-10-CM | POA: Diagnosis not present

## 2016-05-04 DIAGNOSIS — Z7901 Long term (current) use of anticoagulants: Secondary | ICD-10-CM | POA: Diagnosis not present

## 2016-05-04 DIAGNOSIS — K449 Diaphragmatic hernia without obstruction or gangrene: Secondary | ICD-10-CM | POA: Diagnosis present

## 2016-05-04 DIAGNOSIS — K648 Other hemorrhoids: Secondary | ICD-10-CM | POA: Diagnosis present

## 2016-05-04 DIAGNOSIS — D638 Anemia in other chronic diseases classified elsewhere: Secondary | ICD-10-CM | POA: Diagnosis present

## 2016-05-04 DIAGNOSIS — I5033 Acute on chronic diastolic (congestive) heart failure: Secondary | ICD-10-CM | POA: Diagnosis present

## 2016-05-04 DIAGNOSIS — K635 Polyp of colon: Secondary | ICD-10-CM | POA: Diagnosis present

## 2016-05-04 DIAGNOSIS — E785 Hyperlipidemia, unspecified: Secondary | ICD-10-CM | POA: Diagnosis present

## 2016-05-04 DIAGNOSIS — I4819 Other persistent atrial fibrillation: Secondary | ICD-10-CM

## 2016-05-04 DIAGNOSIS — I272 Pulmonary hypertension, unspecified: Secondary | ICD-10-CM | POA: Diagnosis present

## 2016-05-04 DIAGNOSIS — R0789 Other chest pain: Secondary | ICD-10-CM | POA: Diagnosis not present

## 2016-05-04 DIAGNOSIS — R609 Edema, unspecified: Secondary | ICD-10-CM | POA: Diagnosis not present

## 2016-05-04 DIAGNOSIS — I2510 Atherosclerotic heart disease of native coronary artery without angina pectoris: Secondary | ICD-10-CM | POA: Diagnosis not present

## 2016-05-04 DIAGNOSIS — D649 Anemia, unspecified: Secondary | ICD-10-CM

## 2016-05-04 DIAGNOSIS — D751 Secondary polycythemia: Secondary | ICD-10-CM | POA: Diagnosis present

## 2016-05-04 DIAGNOSIS — K432 Incisional hernia without obstruction or gangrene: Secondary | ICD-10-CM | POA: Diagnosis present

## 2016-05-04 DIAGNOSIS — I110 Hypertensive heart disease with heart failure: Principal | ICD-10-CM | POA: Diagnosis present

## 2016-05-04 DIAGNOSIS — D508 Other iron deficiency anemias: Secondary | ICD-10-CM | POA: Diagnosis not present

## 2016-05-04 LAB — BASIC METABOLIC PANEL
ANION GAP: 10 (ref 5–15)
BUN: 15 mg/dL (ref 6–20)
CHLORIDE: 93 mmol/L — AB (ref 101–111)
CO2: 30 mmol/L (ref 22–32)
CREATININE: 1.16 mg/dL (ref 0.61–1.24)
Calcium: 8.9 mg/dL (ref 8.9–10.3)
GFR calc non Af Amer: 60 mL/min — ABNORMAL LOW (ref >60)
Glucose, Bld: 115 mg/dL — ABNORMAL HIGH (ref 65–99)
Potassium: 3.5 mmol/L (ref 3.5–5.1)
SODIUM: 133 mmol/L — AB (ref 135–145)

## 2016-05-04 LAB — DIFFERENTIAL
BASOS PCT: 0 %
Basophils Absolute: 0 10*3/uL (ref 0.0–0.1)
EOS ABS: 0.1 10*3/uL (ref 0.0–0.7)
EOS PCT: 1 %
LYMPHS ABS: 1.1 10*3/uL (ref 0.7–4.0)
Lymphocytes Relative: 12 %
Monocytes Absolute: 0.8 10*3/uL (ref 0.1–1.0)
Monocytes Relative: 9 %
NEUTROS PCT: 78 %
Neutro Abs: 6.5 10*3/uL (ref 1.7–7.7)

## 2016-05-04 LAB — IRON AND TIBC
Iron: 11 ug/dL — ABNORMAL LOW (ref 45–182)
SATURATION RATIOS: 2 % — AB (ref 17.9–39.5)
TIBC: 497 ug/dL — ABNORMAL HIGH (ref 250–450)
UIBC: 486 ug/dL

## 2016-05-04 LAB — D-DIMER, QUANTITATIVE (NOT AT ARMC): D DIMER QUANT: 1.1 ug{FEU}/mL — AB (ref 0.00–0.50)

## 2016-05-04 LAB — VITAMIN B12: Vitamin B-12: 262 pg/mL (ref 180–914)

## 2016-05-04 LAB — CBC
HEMATOCRIT: 29.8 % — AB (ref 39.0–52.0)
HEMOGLOBIN: 9.2 g/dL — AB (ref 13.0–17.0)
MCH: 25.4 pg — ABNORMAL LOW (ref 26.0–34.0)
MCHC: 30.9 g/dL (ref 30.0–36.0)
MCV: 82.3 fL (ref 78.0–100.0)
Platelets: 449 10*3/uL — ABNORMAL HIGH (ref 150–400)
RBC: 3.62 MIL/uL — AB (ref 4.22–5.81)
RDW: 15.1 % (ref 11.5–15.5)
WBC: 7.4 10*3/uL (ref 4.0–10.5)

## 2016-05-04 LAB — I-STAT TROPONIN, ED: TROPONIN I, POC: 0.02 ng/mL (ref 0.00–0.08)

## 2016-05-04 LAB — PROCALCITONIN: Procalcitonin: <0.1 ng/mL

## 2016-05-04 LAB — RETICULOCYTES
RBC.: 3.59 MIL/uL — AB (ref 4.22–5.81)
RETIC CT PCT: 2.5 % (ref 0.4–3.1)
Retic Count, Absolute: 89.8 10*3/uL (ref 19.0–186.0)

## 2016-05-04 LAB — FERRITIN: Ferritin: 13 ng/mL — ABNORMAL LOW (ref 24–336)

## 2016-05-04 LAB — BRAIN NATRIURETIC PEPTIDE: B NATRIURETIC PEPTIDE 5: 216 pg/mL — AB (ref 0.0–100.0)

## 2016-05-04 LAB — POC OCCULT BLOOD, ED: Fecal Occult Bld: POSITIVE — AB

## 2016-05-04 LAB — FOLATE: Folate: 10.4 ng/mL (ref >5.9)

## 2016-05-04 LAB — TROPONIN I: Troponin I: <0.03 ng/mL (ref <0.03)

## 2016-05-04 MED ORDER — SODIUM CHLORIDE 0.9 % IV SOLN: 250.0000 mL | INTRAVENOUS | Status: DC | PRN

## 2016-05-04 MED ORDER — ONDANSETRON HCL 4 MG/2ML IJ SOLN: 4.0000 mg | Freq: Four times a day (QID) | INTRAMUSCULAR | Status: DC | PRN

## 2016-05-04 MED ORDER — ENALAPRIL MALEATE 2.5 MG PO TABS
2.5000 mg | ORAL_TABLET | Freq: Every day | ORAL | Status: DC
  Administered 2016-05-04 – 2016-05-05 (×2): 2.5 mg via ORAL
  Filled 2016-05-04 (×2): qty 1

## 2016-05-04 MED ORDER — SODIUM CHLORIDE 0.9% FLUSH
3.0000 mL | Freq: Two times a day (BID) | INTRAVENOUS | Status: DC
  Administered 2016-05-05 – 2016-05-12 (×11): 3 mL via INTRAVENOUS

## 2016-05-04 MED ORDER — ASPIRIN 81 MG PO CHEW
324.0000 mg | CHEWABLE_TABLET | Freq: Once | ORAL | Status: AC
  Administered 2016-05-04: 324 mg via ORAL
  Filled 2016-05-04: qty 4

## 2016-05-04 MED ORDER — FUROSEMIDE 10 MG/ML IJ SOLN
40.0000 mg | Freq: Two times a day (BID) | INTRAMUSCULAR | Status: DC
  Administered 2016-05-04 – 2016-05-11 (×14): 40 mg via INTRAVENOUS
  Filled 2016-05-04 (×14): qty 4

## 2016-05-04 MED ORDER — DEXTROSE 5 % IV SOLN
1.0000 g | INTRAVENOUS | Status: DC
  Administered 2016-05-05 – 2016-05-11 (×7): 1 g via INTRAVENOUS
  Filled 2016-05-04 (×8): qty 10

## 2016-05-04 MED ORDER — IOPAMIDOL (ISOVUE-370) INJECTION 76%
INTRAVENOUS | Status: AC
  Administered 2016-05-04: 100 mL
  Filled 2016-05-04: qty 100

## 2016-05-04 MED ORDER — DEXTROSE 5 % IV SOLN
500.0000 mg | INTRAVENOUS | Status: DC
  Administered 2016-05-05 – 2016-05-11 (×7): 500 mg via INTRAVENOUS
  Filled 2016-05-04 (×8): qty 500

## 2016-05-04 MED ORDER — ACETAMINOPHEN 325 MG PO TABS: 650.0000 mg | ORAL_TABLET | ORAL | Status: DC | PRN

## 2016-05-04 MED ORDER — LIDOCAINE HCL (PF) 1 % IJ SOLN
INTRAMUSCULAR | Status: AC
  Administered 2016-05-04: 5 mL
  Filled 2016-05-04: qty 5

## 2016-05-04 MED ORDER — SODIUM CHLORIDE 0.9% FLUSH: 3.0000 mL | INTRAVENOUS | Status: DC | PRN

## 2016-05-04 MED ORDER — EZETIMIBE 10 MG PO TABS
10.0000 mg | ORAL_TABLET | Freq: Every day | ORAL | Status: DC
  Administered 2016-05-05 – 2016-05-12 (×7): 10 mg via ORAL
  Filled 2016-05-04 (×7): qty 1

## 2016-05-04 MED ORDER — POTASSIUM CHLORIDE CRYS ER 20 MEQ PO TBCR
20.0000 meq | EXTENDED_RELEASE_TABLET | Freq: Every day | ORAL | Status: DC
  Administered 2016-05-05 – 2016-05-12 (×7): 20 meq via ORAL
  Filled 2016-05-04 (×7): qty 1

## 2016-05-04 MED ORDER — TAMSULOSIN HCL 0.4 MG PO CAPS
0.4000 mg | ORAL_CAPSULE | Freq: Every day | ORAL | Status: DC
  Administered 2016-05-05 – 2016-05-12 (×7): 0.4 mg via ORAL
  Filled 2016-05-04 (×7): qty 1

## 2016-05-04 MED ORDER — HYDROCODONE-ACETAMINOPHEN 5-325 MG PO TABS
1.0000 | ORAL_TABLET | Freq: Four times a day (QID) | ORAL | Status: DC | PRN
  Administered 2016-05-04 – 2016-05-06 (×2): 1 via ORAL
  Filled 2016-05-04 (×2): qty 1

## 2016-05-04 MED ORDER — FUROSEMIDE 10 MG/ML IJ SOLN
40.0000 mg | Freq: Once | INTRAMUSCULAR | Status: AC
  Administered 2016-05-04: 40 mg via INTRAVENOUS
  Filled 2016-05-04: qty 4

## 2016-05-04 MED ORDER — CEFTRIAXONE SODIUM 1 G IJ SOLR
1.0000 g | Freq: Once | INTRAMUSCULAR | Status: AC
  Administered 2016-05-04: 1 g via INTRAMUSCULAR
  Filled 2016-05-04: qty 10

## 2016-05-04 MED ORDER — DEXTROSE 5 % IV SOLN
500.0000 mg | Freq: Once | INTRAVENOUS | Status: AC
  Administered 2016-05-04: 500 mg via INTRAVENOUS
  Filled 2016-05-04: qty 500

## 2016-05-04 NOTE — ED Provider Notes (Signed)
[facility] DEPT Provider Note   CSN: [id number] Arrival date & time: 05/04/16  1404    History   Chief Complaint Chief Complaint  Patient presents with  . Shortness of Breath    HPI Steven Krause is a 76 y.o. male.   The history is provided by the patient.  Chest Pain   This is a new problem. The current episode started 6 to 12 hours ago. Episode frequency: several times today. Progression since onset: waxing and waning. The pain is associated with exertion and coughing. The pain is present in the substernal region. The pain is mild. The quality of the pain is described as pressure-like (tight, pressure-like). The pain does not radiate. Episode Length: several minutes. The symptoms are aggravated by exertion. Associated symptoms include cough, exertional chest pressure, lower extremity edema and shortness of breath. Pertinent negatives include no abdominal pain, no back pain, no fever, no palpitations and no vomiting. He has tried rest for the symptoms. The treatment provided significant relief. Risk factors include smoking/tobacco exposure, male gender, obesity and lack of exercise.  His past medical history is significant for hypertension.  Pertinent negatives for past medical history include no seizures.  His family medical history is significant for heart disease.    Past Medical History:  Diagnosis Date  . Abdominal pain, generalized   . Acquired polycythemia 01/05/2011  . Allergy   . Diarrhea   . Dizziness and giddiness   . Hernia of unspecified site of abdominal cavity without mention of obstruction or gangrene   . Hypertension   . Hypopotassemia   . Iron excess 01/05/2011  . Memory loss   . Other specified cardiac dysrhythmias(427.89)   . 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 Polycythemia, secondary   . Systemic hypertension   . Tobacco use disorder   . Unspecified disorder of kidney and ureter   . Unspecified disorder of skin and subcutaneous tissue   . Unspecified  hearing loss     Patient Active Problem List   Diagnosis Date Noted  . Anemia 04/23/2016  . Blurry vision, right eye 04/23/2016  . Hyperlipidemia 10/19/2015  . Essential hypertension 09/02/2013  . Polycythemia, secondary 09/02/2013  . Hyperglycemia 05/27/2013  . Overweight (BMI 25.0-29.9) 05/27/2013  . Leg edema, left 02/19/2013  . Chronic atrial fibrillation ([place]) 02/14/2013  . Ventricular bigeminy 02/14/2013  . Obesity, unspecified 08/27/2012  . Urinary hesitancy 08/27/2012  . Memory loss   . Tobacco use disorder   . Hypopotassemia   . Acquired polycythemia 01/05/2011  . Iron excess 01/05/2011    Past Surgical History:  Procedure Laterality Date  . CHOLECYSTECTOMY    . INFLAMED COLON    . NM MYOCAR PERF WALL MOTION  8//30/11   normal  . US ECHOCARDIOGRAPHY  11/03/09   EF 50-55%,  . VENTRAL HERNIA REPAIR         Home Medications    Prior to Admission medications   Medication Sig Start Date End Date Taking? Authorizing Provider  amLODipine (NORVASC) 10 MG tablet TAKE 1 TABLET BY MOUTH EVERY DAY 03/14/16  Yes [name] L Reed, DO  furosemide (LASIX) 40 MG tablet TAKE 1 TABLET IN THE MORNING AND TAKE 1 TABLET IN THE EVENING 03/14/16  Yes [name] L Reed, DO  HYDROcodone-acetaminophen (NORCO) 5-325 MG tablet Take 1 tablet by mouth every 6 (six) hours as needed for moderate pain. 04/18/16  Yes [name] L Reed, DO  potassium chloride SA (K-DUR,KLOR-CON) 20 MEQ tablet Take 1 tablet (20 mEq total) by mouth  daily. 12/28/15  Yes [name] L Reed, DO  spironolactone (ALDACTONE) 25 MG tablet Take 1 tablet (25 mg total) by mouth daily. 04/18/16  Yes [name] L Reed, DO  tamsulosin (FLOMAX) 0.4 MG CAPS capsule TAKE ONE CAPSULE BY MOUTH ONCE DAILY 03/14/16  Yes [name] L Reed, DO  XARELTO 20 MG TABS tablet TAKE 1 TABLET BY MOUTH EVERY DAY WITH SUPPER 04/14/16  Yes Mihai Croitoru, MD  ZETIA 10 MG tablet TAKE 1 TABLET BY MOUTH EVERY DAY 04/13/16  Yes Gayland Curry, DO    Family History Family  History  Problem Relation Age of Onset  . Colon polyps Mother 53  . Colon cancer Mother   . Heart disease Father   . Diabetes Sister   . Cancer Brother   . Diabetes Sister   . Diabetes Sister   . Heart disease Sister   . Stomach cancer Neg Hx   . Rectal cancer Neg Hx     Social History Social History  Substance Use Topics  . Smoking status: Current Every Day Smoker    Packs/day: 0.25    Years: 55.00    Types: Pipe  . Smokeless tobacco: Never Used  . Alcohol use No     Allergies   Statins   Review of Systems Review of Systems  Constitutional: Negative for chills and fever.  HENT: Negative for ear pain and sore throat.   Eyes: Negative for pain and visual disturbance.  Respiratory: Positive for cough and shortness of breath.   Cardiovascular: Positive for chest pain and leg swelling. Negative for palpitations.  Gastrointestinal: Negative for abdominal pain and vomiting.  Genitourinary: Negative for dysuria and hematuria.  Musculoskeletal: Negative for arthralgias and back pain.  Skin: Negative for color change and rash.  Neurological: Negative for seizures and syncope.  All other systems reviewed and are negative.    Physical Exam Updated Vital Signs BP 128/73   Pulse 65   Temp 97.4 F (36.3 C) (Oral)   Resp 20   SpO2 95%   Physical Exam  Constitutional: He appears well-developed and well-nourished.  HENT:  Head: Normocephalic and atraumatic.  Eyes: Conjunctivae are normal.  Neck: Neck supple.  Cardiovascular: Normal rate, regular rhythm, normal heart sounds and intact distal pulses.   No murmur heard. Pulmonary/Chest: Breath sounds normal. He has no wheezes. He has no rales.  Pt becomes mildly tachypneic after speaking for several minutes  Abdominal: Soft. He exhibits distension. He exhibits no mass. There is no tenderness. There is no guarding. A hernia (easily reducible ventral hernia) is present.  Musculoskeletal: He exhibits edema (2+ pitting edema  to knees). He exhibits no tenderness or deformity.  Neurological: He is alert.  Skin: Skin is warm and dry.  Psychiatric: He has a normal mood and affect.  Nursing note and vitals reviewed.    ED Treatments / Results  Labs (all labs ordered are listed, but only abnormal results are displayed) Labs Reviewed  CBC - Abnormal; Notable for the following:       Result Value   RBC 3.62 (*)    Hemoglobin 9.2 (*)    HCT 29.8 (*)    MCH 25.4 (*)    Platelets 449 (*)    All other components within normal limits  BASIC METABOLIC PANEL - Abnormal; Notable for the following:    Sodium 133 (*)    Chloride 93 (*)    Glucose, Bld 115 (*)    GFR calc non Af Amer 60 (*)  All other components within normal limits  POC OCCULT BLOOD, ED - Abnormal; Notable for the following:    Fecal Occult Bld POSITIVE (*)    All other components within normal limits  BRAIN NATRIURETIC PEPTIDE  DIFFERENTIAL  I-STAT TROPOININ, ED    EKG  EKG Interpretation  Date/Time:  Wednesday May 04 2016 14:08:06 EST Ventricular Rate:  62 PR Interval:    QRS Duration: 107 QT Interval:  454 QTC Calculation: 462 R Axis:   74 Text Interpretation:  Atrial fibrillation Borderline repolarization abnormality No old tracing to compare Confirmed by [facility]  MD, DAVID ([id number]) on 05/04/2016 3:32:26 PM       Radiology Dg Chest 2 View  Result Date: 05/04/2016 CLINICAL DATA:  76 year old male with weakness, dizziness, shortness of breath. Initial encounter. EXAM: CHEST  2 VIEW COMPARISON:  12/16/2013 and earlier. FINDINGS: Mild cardiomegaly is new since 2015. Other mediastinal contours are within normal limits. Visualized tracheal air column is within normal limits. No pneumothorax or pulmonary edema. There is a small right pleural effusion with patchy mostly middle lobe right lung opacity. There is also subtle new right perihilar patchy opacity (arrows). Mild linear opacity at the left lung base most resembles atelectasis.  Chronic abdominal surgical clips. Negative visible bowel gas pattern. No acute osseous abnormality identified. IMPRESSION: 1. Small right pleural effusion with patchy multifocal right lung opacity which is nonspecific but suspicious for acute bronchopneumonia. 2. Mild cardiomegaly since 2015. Electronically Signed   By: Genevie Ann M.D.   On: 05/04/2016 14:44  heart  Procedures Procedures (including critical care time)  Medications Ordered in ED Medications  azithromycin (ZITHROMAX) 500 mg in dextrose 5 % 250 mL IVPB (500 mg Intravenous New Bag/Given 05/04/16 1559)  cefTRIAXone (ROCEPHIN) injection 1 g (1 g Intramuscular Given 05/04/16 1550)  lidocaine (PF) (XYLOCAINE) 1 % injection (5 mLs  Given 05/04/16 1550)  aspirin chewable tablet 324 mg (324 mg Oral Given 05/04/16 1634)     Initial Impression / Assessment and Plan / ED Course  I have reviewed the triage vital signs and the nursing notes.  Pertinent labs & imaging results that were available during my care of the patient were reviewed by me and considered in my medical decision making (see chart for details).    Pt with hx of HTN, a-fib, prior tobacco use, and obesity p/w chest pain, cough, increased leg swelling, and dyspnea on exertion. Patient reports that his chest pain is been off and on today. It initially started after he was eating breakfast and choked on some food. His pain is in the middle of his chest, described as tightness, and worse with exertion. It does not radiate. When he has these painful episodes, he does not have any nausea, vomiting, shortness of breath, or diaphoresis. However, patient reports over the last week he has been having increased dyspnea on exertion. He reports about a week ago his legs and swelling. He denies any history of heart failure. He does have a cardiologist that he sees for his atrial fibrillation. He is on Xarelto for this. He denies any fever or chills but does endorse cough.  XR c/w right-sided  pneumonia. Rocephin and azithromycin given in Ed. Hgb continues to drop. Hemoccult positive. Aspirin given d/t chest pain concerning for angina. Will admit for further workup and treatment. GI consulted. Pt has remained hemodynamically stable here.  Final Clinical Impressions(s) / ED Diagnoses   Final diagnoses:  Dyspnea on exertion  Stool guaiac positive  Anemia, unspecified  type  Community acquired pneumonia of right lung, unspecified part of lung    New Prescriptions New Prescriptions   No medications on file     Clifton James, MD 54/65/03 [id number]    David Glick, MD 68/12/75 1700

## 2016-05-04 NOTE — ED Notes (Signed)
Attempted report x1. 

## 2016-05-04 NOTE — H&P (Signed)
History and Physical        Hospital Admission Note Date: 05/04/2016  Patient name: Steven Krause Medical record number: [id number] Date of birth: [birth year] Age: 76 y.o. Gender: male  PCP: REED, [name], DO     Patient coming from: Home   Chief Complaint:  Shortness of breath with chest tightness since morning, peripheral edema worsening in the last 2 weeks  HPI: Patient is a 76 year old male with history of polycythemia, abdominal hernia, hypertension, hypertension presented with shortness of breath and chest tightness since this morning and worsening peripheral edema in the last 2 weeks. History was obtained from the patient. The patient reported that he was in his baseline health this morning when he woke up. He was eating breakfast when he choked up on the eggs around 8 AM and since then he felt that he was not able to catch his breath. Patient also felt pressure-like chest tightness with no radiation. He denied any nausea, vomiting any palpitations. He admitted to having occasional cough but no fevers or chills. Patient has noticed in the last 2 weeks he has been getting exertional shortness of breath and lower extremity edema worsening. He does have abdominal distention but he attributes it to his abdominal hernia, no abdominal pain. He denies any orthopnea or PND however when he sat up for his examination, he stated he felt better. Patient denies any hematochezia or melena.  ED work-up/course: Temp 97.4, respiratory rate 20, pulse 68, BP 135/70, O2 sats 94% on room air Sodium 133, potassium 3.5 BNP 216, troponin POC 0.02. WBC 7.4, hemoglobin 9.2. Hemoglobin was 10.2 on 2/12 and 12.9 on 12/25/15, gradually has been trending down   FOBT positive  Review of Systems: Positives marked in 'bold' Constitutional: Denies fever, chills, diaphoresis, poor appetite, + fatigue.    HEENT: Denies photophobia, eye pain, redness, hearing loss, ear pain, congestion, sore throat, rhinorrhea, sneezing, mouth sores, trouble swallowing, neck pain, neck stiffness and tinnitus.   Respiratory: Please see history of present illness   Cardiovascular: please history of present illness  Gastrointestinal: Denies nausea, vomiting, abdominal pain, diarrhea, constipation, blood in stool and abdominal distention.  Genitourinary: Denies dysuria, urgency, frequency, hematuria, flank pain and difficulty urinating.  Musculoskeletal: Denies myalgias, back pain, joint swelling, arthralgias and gait problem.  Skin: Denies pallor, rash and wound.  Neurological: Denies dizziness, seizures, syncope, weakness, light-headedness, numbness and headaches.  Hematological: Denies adenopathy. Easy bruising, personal or family bleeding history  Psychiatric/Behavioral: Denies suicidal ideation, mood changes, confusion, nervousness, sleep disturbance and agitation  Past Medical History: Past Medical History:  Diagnosis Date  . Abdominal pain, generalized   . Acquired polycythemia 01/05/2011  . Allergy   . Diarrhea   . Dizziness and giddiness   . Hernia of unspecified site of abdominal cavity without mention of obstruction or gangrene   . Hypertension   . Hypopotassemia   . Iron excess 01/05/2011  . Memory loss   . Other specified cardiac dysrhythmias(427.89)   . 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 Polycythemia, secondary   . Systemic hypertension   . Tobacco use disorder   . Unspecified disorder of kidney and ureter   . Unspecified disorder of skin  and subcutaneous tissue   . Unspecified hearing loss     Past Surgical History:  Procedure Laterality Date  . CHOLECYSTECTOMY    . INFLAMED COLON    . NM MYOCAR PERF WALL MOTION  8//30/11   normal  . US ECHOCARDIOGRAPHY  11/03/09   EF 50-55%,  . VENTRAL HERNIA REPAIR      Medications: Prior to Admission medications   Medication Sig Start Date End  Date Taking? Authorizing Provider  amLODipine (NORVASC) 10 MG tablet TAKE 1 TABLET BY MOUTH EVERY DAY 03/14/16  Yes [name] L Reed, DO  furosemide (LASIX) 40 MG tablet TAKE 1 TABLET IN THE MORNING AND TAKE 1 TABLET IN THE EVENING 03/14/16  Yes [name] L Reed, DO  HYDROcodone-acetaminophen (NORCO) 5-325 MG tablet Take 1 tablet by mouth every 6 (six) hours as needed for moderate pain. 04/18/16  Yes [name] L Reed, DO  potassium chloride SA (K-DUR,KLOR-CON) 20 MEQ tablet Take 1 tablet (20 mEq total) by mouth daily. 12/28/15  Yes [name] L Reed, DO  spironolactone (ALDACTONE) 25 MG tablet Take 1 tablet (25 mg total) by mouth daily. 04/18/16  Yes [name] L Reed, DO  tamsulosin (FLOMAX) 0.4 MG CAPS capsule TAKE ONE CAPSULE BY MOUTH ONCE DAILY 03/14/16  Yes [name] L Reed, DO  XARELTO 20 MG TABS tablet TAKE 1 TABLET BY MOUTH EVERY DAY WITH SUPPER 04/14/16  Yes Mihai Croitoru, MD  ZETIA 10 MG tablet TAKE 1 TABLET BY MOUTH EVERY DAY 04/13/16  Yes [name] L Reed, DO    Allergies:   Allergies  Allergen Reactions  . Statins     unknown    Social History:  reports that he has been smoking Pipe.  He has a 13.75 pack-year smoking history. He has never used smokeless tobacco. He reports that he does not drink alcohol or use drugs.  Family History: Family History  Problem Relation Age of Onset  . Colon polyps Mother 49  . Colon cancer Mother   . Heart disease Father   . Diabetes Sister   . Cancer Brother   . Diabetes Sister   . Diabetes Sister   . Heart disease Sister   . Stomach cancer Neg Hx   . Rectal cancer Neg Hx     Physical Exam: Blood pressure 123/74, pulse (!) 49, temperature 97.4 F (36.3 C), temperature source Oral, resp. rate 18, SpO2 93 %. General: Alert, awake, oriented x3, in no acute distress. HEENT: normocephalic, atraumatic, anicteric sclera, pink conjunctiva, pupils equal and reactive to light and accomodation, oropharynx clear Neck: supple, no masses or lymphadenopathy, no goiter, no  bruits  Heart: Regular rate and rhythm, without murmurs, rubs or gallops. Lungs: Decreased breath sounds at the bases Abdomen: Soft, nontender, distended due to abdominal hernia, positive bowel sounds Extremities: No clubbing, cyanosis, 2+ pitting edema to knees, with positive pedal pulses. Neuro: Grossly intact, no focal neurological deficits, strength 5/5 upper and lower extremities bilaterally Psych: alert and oriented x 3, normal mood and affect Skin: no rashes or lesions, warm and dry   LABS on Admission:  Basic Metabolic Panel:  Recent Labs Lab 05/04/16 1454  NA 133*  K 3.5  CL 93*  CO2 30  GLUCOSE 115*  BUN 15  CREATININE 1.16  CALCIUM 8.9   Liver Function Tests: No results for input(s): AST, ALT, ALKPHOS, BILITOT, PROT, ALBUMIN in the last 168 hours. No results for input(s): LIPASE, AMYLASE in the last 168 hours. No results for input(s): AMMONIA in the last 168  hours. CBC:  Recent Labs Lab 05/04/16 1454  WBC 7.4  HGB 9.2*  HCT 29.8*  MCV 82.3  PLT 449*   Cardiac Enzymes: No results for input(s): CKTOTAL, CKMB, CKMBINDEX, TROPONINI in the last 168 hours. BNP: Invalid input(s): POCBNP CBG: No results for input(s): GLUCAP in the last 168 hours.  Radiological Exams on Admission:  No results found.  *I have personally reviewed the images above*  EKG: Independently reviewed. Rate 62, height of fibrillation with borderline repolarization changes.   Assessment/Plan Principal Problem:   Acute CHF (congestive heart failure) ([place]): Unknown EF - Patient presenting with exertional dyspnea, chest tightness and worsening peripheral edema in the last 2 weeks, elevated BNP. Previous echo in 2011 - Chest x-ray shows small right pleural effusion with patchy multifocal right lung opacity, nonspecific but suspicious for acute bronchopneumonia - Placed on strict I's and O's and daily weights - Obtain 2-D echocardiogram, placed on IV diuresis with Lasix 40 mg q12hrs  -  Will obtain cardiology consult, emailed Trish  - Follow serial cardiac enzymes, d-dimer, obtain Doppler ultrasound of the lower extremities to rule out DVT   Active Problems:   Chronic atrial fibrillation (HCC) - Ventricular response slow in 60s, hence no beta blockers  - Patient is on xarelto, currently on hold secondary to anemia and positive FOBT    Essential hypertension - Currently stable, will place on low-dose  ACEI, Lasix  - Hold Norvasc due to acute CHF, no beta blocker secondary to bradycardia     Chronic Anemia - Unclear etiology, obtain anemia panel, patient has gradually trending down hemoglobin in the last 6 months  - EDP has called for GI consult  - will hold xarelto    CAP (community acquired pneumonia): Possibly due to aspiration due to choking on the breakfast today  - Chest x-ray shows small right pleural effusion with patchy multifocal right lung opacity, nonspecific but suspicious for acute bronchopneumonia - for now place on Zithromax, Rocephin, obtain urine strep antigen, pro-calcitonin   DVT prophylaxis: SCD's   CODE STATUS:  full CODE STATUS   Consults called: emailed Cardiology, GI  consulted by EDP   Family Communication: Admission, patients condition and plan of care including tests being ordered have been discussed with the patient who indicates understanding and agree with the plan and Code Status  Admission status: inpatient, tele   Disposition plan: Further plan will depend as patient's clinical course evolves and further radiologic and laboratory data become available.   At the time of admission, it appears that the appropriate admission status for this patient is INPATIENT . This is judged to be reasonable and necessary in order to provide the required intensity of service to ensure the patient's safety given the presenting symptoms of chest pain, shortness of breath, 2+ pitting edema, and initial radiographic x-ray showing possible pneumonia and  laboratory data in the context of their chronic comorbidities.     Time Spent on Admission: 89mns     Sakai Wolford M.D. [facility] 05/04/2016, 5:32 PM Pager: [phone] If 7PM-7AM, please contact night-coverage [email or web address] Password [email or web address]

## 2016-05-04 NOTE — ED Triage Notes (Signed)
Per [facility], pt from home, states he was choked up eating breakfast (possibly eggs) at 8am this morning and states ever since he hasn't been able to catch his breathe.

## 2016-05-05 ENCOUNTER — Inpatient Hospital Stay (HOSPITAL_COMMUNITY): Payer: Medicare Other

## 2016-05-05 ENCOUNTER — Encounter (HOSPITAL_COMMUNITY): Payer: Self-pay | Admitting: *Deleted

## 2016-05-05 DIAGNOSIS — I501 Left ventricular failure: Secondary | ICD-10-CM

## 2016-05-05 DIAGNOSIS — I482 Chronic atrial fibrillation: Secondary | ICD-10-CM

## 2016-05-05 DIAGNOSIS — R195 Other fecal abnormalities: Secondary | ICD-10-CM

## 2016-05-05 DIAGNOSIS — I10 Essential (primary) hypertension: Secondary | ICD-10-CM

## 2016-05-05 DIAGNOSIS — I509 Heart failure, unspecified: Secondary | ICD-10-CM

## 2016-05-05 DIAGNOSIS — R609 Edema, unspecified: Secondary | ICD-10-CM

## 2016-05-05 DIAGNOSIS — R079 Chest pain, unspecified: Secondary | ICD-10-CM

## 2016-05-05 DIAGNOSIS — D509 Iron deficiency anemia, unspecified: Secondary | ICD-10-CM

## 2016-05-05 DIAGNOSIS — R0609 Other forms of dyspnea: Secondary | ICD-10-CM

## 2016-05-05 LAB — CBC
HEMATOCRIT: 28.4 % — AB (ref 39.0–52.0)
HEMOGLOBIN: 8.8 g/dL — AB (ref 13.0–17.0)
MCH: 25.4 pg — ABNORMAL LOW (ref 26.0–34.0)
MCHC: 31 g/dL (ref 30.0–36.0)
MCV: 82.1 fL (ref 78.0–100.0)
Platelets: 521 10*3/uL — ABNORMAL HIGH (ref 150–400)
RBC: 3.46 MIL/uL — ABNORMAL LOW (ref 4.22–5.81)
RDW: 14.9 % (ref 11.5–15.5)
WBC: 8.8 10*3/uL (ref 4.0–10.5)

## 2016-05-05 LAB — ECHOCARDIOGRAM COMPLETE
HEIGHTINCHES: 69 in
WEIGHTICAEL: 3396.8 [oz_av]

## 2016-05-05 LAB — BASIC METABOLIC PANEL
Anion gap: 7 (ref 5–15)
BUN: 17 mg/dL (ref 6–20)
CO2: 33 mmol/L — ABNORMAL HIGH (ref 22–32)
CREATININE: 1.09 mg/dL (ref 0.61–1.24)
Calcium: 8.4 mg/dL — ABNORMAL LOW (ref 8.9–10.3)
Chloride: 91 mmol/L — ABNORMAL LOW (ref 101–111)
GFR calc Af Amer: >60 mL/min (ref >60)
GLUCOSE: 105 mg/dL — AB (ref 65–99)
Potassium: 3.6 mmol/L (ref 3.5–5.1)
SODIUM: 131 mmol/L — AB (ref 135–145)

## 2016-05-05 LAB — TROPONIN I
Troponin I: <0.03 ng/mL (ref <0.03)
Troponin I: <0.03 ng/mL (ref <0.03)

## 2016-05-05 LAB — STREP PNEUMONIAE URINARY ANTIGEN: STREP PNEUMO URINARY ANTIGEN: NEGATIVE

## 2016-05-05 MED ORDER — SODIUM CHLORIDE 0.9% FLUSH
3.0000 mL | Freq: Two times a day (BID) | INTRAVENOUS | Status: DC
  Administered 2016-05-06: 3 mL via INTRAVENOUS

## 2016-05-05 MED ORDER — POLYSACCHARIDE IRON COMPLEX 150 MG PO CAPS
150.0000 mg | ORAL_CAPSULE | Freq: Every day | ORAL | Status: DC
  Administered 2016-05-05 – 2016-05-12 (×7): 150 mg via ORAL
  Filled 2016-05-05 (×7): qty 1

## 2016-05-05 MED ORDER — SODIUM CHLORIDE 0.9 % WEIGHT BASED INFUSION: 1.0000 mL/kg/h | INTRAVENOUS | Status: DC

## 2016-05-05 MED ORDER — SODIUM CHLORIDE 0.9 % WEIGHT BASED INFUSION: 3.0000 mL/kg/h | INTRAVENOUS | Status: DC

## 2016-05-05 MED ORDER — ASPIRIN 81 MG PO CHEW
81.0000 mg | CHEWABLE_TABLET | ORAL | Status: AC
  Administered 2016-05-06: 81 mg via ORAL
  Filled 2016-05-05: qty 1

## 2016-05-05 MED ORDER — SODIUM CHLORIDE 0.9 % IV SOLN: 250.0000 mL | INTRAVENOUS | Status: DC | PRN

## 2016-05-05 MED ORDER — SODIUM CHLORIDE 0.9% FLUSH: 3.0000 mL | INTRAVENOUS | Status: DC | PRN

## 2016-05-05 NOTE — Consult Note (Signed)
Cardiology Consult    Patient ID: Steven Krause MRN: [id number], DOB/AGE: 03/20/1940   Admit date: 05/04/2016 Date of Consult: 05/05/2016  Primary Physician: Hollace Kinnier, DO Primary Cardiologist: Dr. Sallyanne Kuster Requesting Provider: Dr. Tana Coast  Reason for Consult: Chest pain, CHF  Patient Profile    Steven Krause is a 76 yo male with a PMH significant for rate controlled Afib on xarelto with frequent PVCs, smoker, HTN, anemia, and CHF. He presented to the ED with exertional chest discomfort and dyspnea. Cardiology was consulted for chest pain and possible CHF.  Past Medical History   Past Medical History:  Diagnosis Date  . Abdominal pain, generalized   . Acquired polycythemia 01/05/2011  . Allergy   . Diarrhea   . Dizziness and giddiness   . Hernia of unspecified site of abdominal cavity without mention of obstruction or gangrene   . Hypertension   . Hypopotassemia   . Iron excess 01/05/2011  . Memory loss   . Other specified cardiac dysrhythmias(427.89)   . 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 Polycythemia, secondary   . Systemic hypertension   . Tobacco use disorder   . Unspecified disorder of kidney and ureter   . Unspecified disorder of skin and subcutaneous tissue   . Unspecified hearing loss     Past Surgical History:  Procedure Laterality Date  . CHOLECYSTECTOMY    . INFLAMED COLON    . NM MYOCAR PERF WALL MOTION  8//30/11   normal  . US ECHOCARDIOGRAPHY  11/03/09   EF 50-55%,  . VENTRAL HERNIA REPAIR       Allergies  Allergies  Allergen Reactions  . Statins     unknown    History of Present Illness    The patient is known to our service and last saw Dr. Sallyanne Kuster in clinic on 10/19/15. He was stable on his medications and in rate controlled Afib at that time.   On my interview today, the patient states that he has been having chest discomfort and shortness of breath when he walks for the past three days.  Chest pain and SOB are relieved with rest, but returns  when activity is resumed. He experiences this chest discomfort several times every day. The pain is substernal-left sided and can radiate to his back and left arm. He describes the pain as tight pressure as if someone is squeezing his heart.  He also reports new lower extremity swelling associated with weeping at times.  He states this is a new problem, but it has been documented in an outpatient visit with PCP on 12/25/15 and by Dr. Sallyanne Kuster on 10/19/15.    Inpatient Medications    . azithromycin  500 mg Intravenous Q24H  . cefTRIAXone (ROCEPHIN)  IV  1 g Intravenous Q24H  . ezetimibe  10 mg Oral Daily  . furosemide  40 mg Intravenous Q12H  . potassium chloride SA  20 mEq Oral Daily  . sodium chloride flush  3 mL Intravenous Q12H  . tamsulosin  0.4 mg Oral Daily     Outpatient Medications    Prior to Admission medications   Medication Sig Start Date End Date Taking? Authorizing Provider  amLODipine (NORVASC) 10 MG tablet TAKE 1 TABLET BY MOUTH EVERY DAY 03/14/16  Yes [name] L Reed, DO  furosemide (LASIX) 40 MG tablet TAKE 1 TABLET IN THE MORNING AND TAKE 1 TABLET IN THE EVENING 03/14/16  Yes [name] L Reed, DO  HYDROcodone-acetaminophen (NORCO) 5-325 MG tablet Take 1 tablet by mouth every  6 (six) hours as needed for moderate pain. 04/18/16  Yes [name] L Reed, DO  potassium chloride SA (K-DUR,KLOR-CON) 20 MEQ tablet Take 1 tablet (20 mEq total) by mouth daily. 12/28/15  Yes [name] L Reed, DO  spironolactone (ALDACTONE) 25 MG tablet Take 1 tablet (25 mg total) by mouth daily. 04/18/16  Yes [name] L Reed, DO  tamsulosin (FLOMAX) 0.4 MG CAPS capsule TAKE ONE CAPSULE BY MOUTH ONCE DAILY 03/14/16  Yes [name] L Reed, DO  XARELTO 20 MG TABS tablet TAKE 1 TABLET BY MOUTH EVERY DAY WITH SUPPER 04/14/16  Yes Mihai Croitoru, MD  ZETIA 10 MG tablet TAKE 1 TABLET BY MOUTH EVERY DAY 04/13/16  Yes Gayland Curry, DO     Family History     Family History  Problem Relation Age of Onset  . Colon polyps  Mother 83  . Colon cancer Mother   . Heart disease Father   . Diabetes Sister   . Cancer Brother   . Diabetes Sister   . Diabetes Sister   . Heart disease Sister   . Stomach cancer Neg Hx   . Rectal cancer Neg Hx     Social History    Social History   Social History  . Marital status: Single    Spouse name: N/A  . Number of children: N/A  . Years of education: N/A   Occupational History  . Not on file.   Social History Main Topics  . Smoking status: Former Smoker    Packs/day: 0.25    Years: 55.00    Types: Pipe    Quit date: 05/01/2016  . Smokeless tobacco: Never Used  . Alcohol use No  . Drug use: No  . Sexual activity: Not Currently   Other Topics Concern  . Not on file   Social History Narrative   Friend [name] is contact and helps with his finances, appts, etc.     Review of Systems    General:  No chills, fever, night sweats or weight changes.  Cardiovascular:  No current chest pain, dyspnea on exertion, edema, orthopnea, palpitations, paroxysmal nocturnal dyspnea. Dermatological: No rash, lesions/masses Respiratory: No cough, dyspnea Urologic: No hematuria, dysuria Abdominal:   No nausea, vomiting, diarrhea, bright red blood per rectum, melena, or hematemesis Neurologic:  No visual changes, changes in mental status. All other systems reviewed and are otherwise negative except as noted above.  Physical Exam    Blood pressure (!) 107/51, pulse 63, temperature 98 F (36.7 C), temperature source Oral, resp. rate 18, height '5\' 9"'$  (1.753 m), weight 212 lb 4.8 oz (96.3 kg), SpO2 98 %.  General: Pleasant, NAD Psych: Normal affect. Neuro: Alert and oriented X 3. Moves all extremities spontaneously. HEENT: Normal  Neck: Supple without bruits or mild JVD. Lungs:  Resp regular and unlabored, scattered crackles in bases Heart: Irregularly irregular rhythm, regular rate, no murmurs. Abdomen: Soft, non-tender, non-distended, BS + x 4.  Extremities: No clubbing,  cyanosis; 2+ - 3+ pitting edema in B LE. DP/PT/Radials 1+ and equal bilaterally.  Labs    Troponin [facility] Test)  Recent Labs  05/04/16 1520  TROPIPOC 0.02    Recent Labs  05/04/16 1900 05/05/16 0114 05/05/16 0710  TROPONINI <0.03 <0.03 <0.03   Lab Results  Component Value Date   WBC 8.8 05/05/2016   HGB 8.8 (L) 05/05/2016   HCT 28.4 (L) 05/05/2016   MCV 82.1 05/05/2016   PLT 521 (H) 05/05/2016     Recent Labs  Lab 05/05/16 0710  NA 131*  K 3.6  CL 91*  CO2 33*  BUN 17  CREATININE 1.09  CALCIUM 8.4*  GLUCOSE 105*   Lab Results  Component Value Date   CHOL 155 03/04/2015   HDL 58 03/04/2015   LDLCALC 75 03/04/2015   TRIG 108 03/04/2015   Lab Results  Component Value Date   DDIMER 1.10 (H) 05/04/2016     Radiology Studies    Dg Chest 2 View  Result Date: 05/04/2016 CLINICAL DATA:  76 year old male with weakness, dizziness, shortness of breath. Initial encounter. EXAM: CHEST  2 VIEW COMPARISON:  12/16/2013 and earlier. FINDINGS: Mild cardiomegaly is new since 2015. Other mediastinal contours are within normal limits. Visualized tracheal air column is within normal limits. No pneumothorax or pulmonary edema. There is a small right pleural effusion with patchy mostly middle lobe right lung opacity. There is also subtle new right perihilar patchy opacity (arrows). Mild linear opacity at the left lung base most resembles atelectasis. Chronic abdominal surgical clips. Negative visible bowel gas pattern. No acute osseous abnormality identified. IMPRESSION: 1. Small right pleural effusion with patchy multifocal right lung opacity which is nonspecific but suspicious for acute bronchopneumonia. 2. Mild cardiomegaly since 2015. Electronically Signed   By: Genevie Ann M.D.   On: 05/04/2016 14:44   Ct Angio Chest Pe W Or Wo Contrast  Result Date: 05/04/2016 CLINICAL DATA:  Worsening chest pain for 1 week. EXAM: CT ANGIOGRAPHY CHEST WITH CONTRAST TECHNIQUE: Multidetector  CT imaging of the chest was performed using the standard protocol during bolus administration of intravenous contrast. Multiplanar CT image reconstructions and MIPs were obtained to evaluate the vascular anatomy. CONTRAST:  100 cc Isovue 370 IV COMPARISON:  Chest radiograph earlier this day FINDINGS: Cardiovascular: There are no filling defects within the pulmonary arteries to suggest pulmonary embolus. Mild multi chamber cardiomegaly. There is atherosclerosis of the thoracic aorta without aneurysm. Mediastinum/Nodes: Bulky right hilar adenopathy, node measures 3.4 cm. Enlarged lower paratracheal node suspected measuring 13 mm short axis, however motion artifact is present. There is a prominent sub- carinal node measuring 10 mm. No left hilar adenopathy. No definite supraclavicular adenopathy. Peripherally calcified nodule in the right lobe of the thyroid gland measures 15 mm. The esophagus is decompressed. Lungs/Pleura: Central right upper lobe pulmonary nodule measures 2.3 x 1.7 cm with ill-defined margins. There is perihilar ill-defined ground-glass opacity in the upper lobe, however breathing motion in this region, difficult to exclude involvement of superior segment of the right lower lobe. This ground-glass opacity appears multifocal but centered around the central airways. There are a few nodular opacities in the right lower lobe for example 6 x 8 mm nodule image 86 series 407. Small right lower lobe nodules more inferiorly. Ill-defined density in the anterior most right lower lobe about the costophrenic sulcus. There is a 2.1 x 2.0 cm soft tissue density abutting the mediastinum in the right middle lobe image 102 series 407. Within the lingula there is nonspecific 1.7 x 2.3 cm nodularity abutting the pleura. Ill-defined subpleural nodularity in the anterior left upper lobe image 72 series 407. There small bilateral pleural effusions. Upper Abdomen: No evidence of adrenal nodule. No definite acute  abnormality. Laxity of the anterior abdominal wall with probable ventral abdominal wall hernia is only partially included. Musculoskeletal: There are no acute or suspicious osseous abnormalities. No blastic or destructive lytic lesions. Review of the MIP images confirms the above findings. IMPRESSION: 1. Irregular 2.3 cm right upper lobe pulmonary  nodule suspicious for primary bronchogenic malignancy. There is bulky right hilar and mild mediastinal adenopathy. 2. Right upper lobe perihilar ground-glass opacities are nonspecific, a be neoplastic, infectious or inflammatory. 3. Paramediastinal soft tissue densities in the lingula and right middle lobe, left upper and right lower lobe pulmonary nodules are also nonspecific. 4. Small pleural effusions. 5. No pulmonary embolus. These results will be called to the ordering clinician or representative by the Radiologist Assistant, and communication documented in the PACS or zVision Dashboard. Electronically Signed   By: Jeb Levering M.D.   On: 05/04/2016 22:20    ECG & Cardiac Imaging    EKG 05/04/16: Rate controlled Afib   Echocardiogram 05/05/16: pending   Myoview 11/03/09: Normal scan, low risk  Echocardiogram 11/03/09: Normal LV function (50-55%), no wall motion abnormalities   Assessment & Plan    1. Chest pain - EKG without signs of ischemia; troponin x 3 negative - pt has history of HTN, HLD, and smoking and strong family history of heart disease - pt has had chest pain with exertion that is relieved with rest x 3 days; would recommend ischemia workup inpatient - will wait for echo results; if echo grossly abnormal, will plan for heart catheterization as he will have been off of xarelto for 2 days - NPO tonight for possible cath tomorrow; hold ACEI tomorrow, pending echo results; continue to hold anticoagulation - he was not heparinized 2/ anemia on admission - spoke with patient about possible catheterization tomorrow, pending echo  results - The patient understands that risks included but are not limited to stroke (1 in 1000), death (1 in 1000), kidney failure [usually temporary] (1 in 500), bleeding (1 in 200), allergic reaction [possibly serious] (1 in 200).   2. Evaluate CHF, pleural effusion, LE edema - echocardiogram pending to evaluate function - BNP 219 - pt presented with pleural effusions and LE edema; however, given his low BNP this volume may be related to pulmonary nodules concerning for bronchogenic malignancy vs PNA - diuresing per primary team with lasix 40 mg IV BID -> 1.4L urine output yesterday, net negative 500cc; weight stable from admission - sCr 1.09 (1.16); baseline sCr 0.88-1.03 - K stable (3.6) - albumin is low (3.5), likely contributing to his edema   3. Permanent atrial fibrillation, rate controlled - pt denies dizziness, lightheadedness, and palpitations - last dose of xarelto was 05/04/16 evening - not currently on a beta blocker at home 2/ bradycardia; ventricular rates in the 60s   3. HTN - home meds: norvasc, spironolactone, lasix - enalapril 2.5 mg started; hold tomorrow for possible cath   4. Positive D-Dimer - CTA ruled out PE  - given his LE swelling, recommend LE duplex to r/o DVT   5. Anemia - positive FOBT - low iron and ferritin; normal-high TIBC - Hb 8.8 today from 9.2 on admission (previously 10.2 on 04/18/16; 12.9 on 12/25/15) - continue to hold anticoagulation   6. PNA - found on CTA this admission - started on zithromax and rocephin per primary team    Signed, Ledora Bottcher, PA-C 05/05/2016, 2:27 PM    Patient seen and examined. Agree with assessment and plan.  Steven. Eamonn Sermeno is a 76 year old Caucasian male who is a history of permanent atrial fibrillation and has been on Xarelto therapy for anticoagulation.  He has.  I commended frequent PVCs.  He has a history of hypertension, a long-standing history of prior pipe smoking, 9 any history of GI  bleeds or  episodes of chest discomfort.  Over the last several days, he has developed significant change in symptomatology with the development of exertional dyspnea and exertional substernal chest tightness to the point where he could not even go to the mailbox without discomfort.  He has also noted lower extremity swelling.  He was admitted yesterday .  Laboratory was notable for sodium 131.  He had normal renal function.  He has severe iron deficiency with iron saturation of 2%, hemoglobin was 8.8 with hematocrit of 28.4.  Stool guaiac is positive.  His Xarelto has been held.  His exam is notable for poor dentition.  Blood pressures 114/60.  He is in atrial fibrillation with slow ventricular rate in the upper 40s and 50s.  He is not on any rate control medication.  He has decreased breath sounds without wheezing.  Rhythm is bradycardic and irregularly irregular.  There is 1/6 systolic murmur.  Abdomen was nontender.  There is 3+ pitting edema to his knees bilaterally.  Neurologic exam is grossly nonfocal.  Troponins are negative 3.  ECG independently reviewed by me shows atrial fibrillation at 62 bpm with nonspecific T changes.  Chest x-ray is suspicious for a primary bronchogenic malignancy with a suspicious 2.3 cm right upper pulmonary nodule.  There is a small pleural effusion. Steven. Turano presents with a three-day history of significant change in symptomatology with exertional shortness of breath and significant exertional chest pressure, worrisome for ischemia.  This may be exacerbated by his development of significant iron deficiency anemia and he has been demonstrated to have guaiac positive stools and hemoglobin of 8.8 .  His anticoagulation has been on hold.  His last dose of Xarelto was yesterday.  With his significant change in symptomatology and significant cardiac risk factor profile while he is off anticoagulation.  I would recommend definitive cardiac catheterization to evaluate his high  likelihood for an ischemic etiology to his symptoms, which may have been unmasked by his anemia.  In addition, he will need further workup for possible lung malignancy.  We will keep the patient nothing by mouth in the morning.  We will tentatively place him on the cath schedule for possible cardiac catheterization tomorrow afternoon after he's been off Xarelto for close to 48 hours.  With his anemia and GI blood loss.  He may not be in initial candidate for percutaneous intervention tomorrow, but it would be important to define his coronary anatomy, particularly in light of need for future potential surgical procedures.   Troy Sine, MD, [facility] 05/05/2016 4:18 PM

## 2016-05-05 NOTE — Progress Notes (Signed)
Patient stable during 7 a to 7 p shift, VSS, O2 sats remain in the 90's on room air.  Patient denies any chest pain or shortness of breath.  Consent signed for cardiac cath 05/06/16, patient understands that he is nothing by mouth after midnight.

## 2016-05-05 NOTE — Progress Notes (Signed)
**  Preliminary report by tech**  Bilateral lower extremity venous duplex completed. There is no evidence of deep or superficial vein thrombosis involving the right and left lower extremities. All visualized vessels appear patent and compressible. There is no evidence of Baker's cysts bilaterally.  05/05/16 5:19 PM Carlos Levering RVT

## 2016-05-05 NOTE — Progress Notes (Signed)
[facility]                                                                              Patient Demographics  Steven Krause, is a 76 y.o. male, DOB - 05/27/1940, MWU:[id number]  Admit date - 05/04/2016   Admitting Physician Joanny Dupree Krystal Eaton, MD  Outpatient Primary MD for the patient is REED, [name], DO  Outpatient specialists:   LOS - 1  days    Chief Complaint  Patient presents with  . Shortness of Breath       Brief summary   Patient is a 76 year old male with history of polycythemia, abdominal hernia, hypertension, hypertension presented with shortness of breath and chest tightness since this morning and worsening peripheral edema in the last 2 weeks. History was obtained from the patient. The patient reported that he was in his baseline health this morning when he woke up. He was eating breakfast when he choked up on the eggs around 8 AM and since then he felt that he was not able to catch his breath. Patient also felt pressure-like chest tightness with no radiation. He does have abdominal distention but he attributes it to his abdominal hernia, no abdominal pain. He denies any orthopnea or PND however when he sat up for his examination, he stated he felt better. Patient denied any hematochezia or melena to me however to EDP, he reported intermittent block stools. hemoglobin 9.2. Hemoglobin was 10.2 on 2/12 and 12.9 on 12/25/15, gradually has been trending down.  FOBT positive   Assessment & Plan    Principal Problem:   Acute CHF (congestive heart failure) ([place]): Unknown EF - Patient presenting with exertional dyspnea, chest tightness and worsening peripheral edema in the last 2 weeks, elevated BNP. Previous echo in 2011 - Chest x-ray shows small right pleural effusion with patchy multifocal right lung opacity, nonspecific but suspicious for acute bronchopneumonia - Placed on strict I's and O's and daily weights - Obtain 2-D echocardiogram, placed on IV  diuresis with Lasix 40 mg q12hrs  - Troponins negative, d-dimer elevated, CT angiogram of the chest negative for PE however showed irregular 2.3 cm right upper lobe pulmonary nodule suspicious for primary bronchogenic malignancy with bulky right hilar and mediastinal adenopathy.  -  Doppler ultrasound of the lower extremities to rule out DVT   Active Problems: 2.3 cm right upper lobe pulmonary nodule/tumor - CT angiogram of the chest was negative for PE however showed irregular 2.3 cm right upper lobe, the nodule suspicious for primary bronchogenic malignancy with bulky right hilar and mild mediastinal adenopathy - Explained in detail to the patient, discussed with pulmonology, Dr Rogene Houston,  will likely need outpatient PET CT and biopsy, recommended to make follow-up appointment outpatient - follow-up scheduled with Dr. Melvyn Novas on 05/12/16 at 3:30 PM    Chronic atrial fibrillation ([place]) - Ventricular response slow in 60s, Krause no beta blockers  - Patient is on xarelto, currently on hold secondary to anemia and positive FOBT    Essential hypertension - Currently stable, will place on low-dose  ACEI, Lasix  - Hold Norvasc due to acute CHF, no beta blocker secondary to  bradycardia     Chronic Anemia - Unclear etiology, FOBT positive, hemoglobin trending down to 8.8 today - Anemia panel shows iron deficiency anemia - Colonoscopy in 07/2012 had shown internal hemorrhoids otherwise negative - will hold xarelto, GI consulted     CAP (community acquired pneumonia): Possibly due to aspiration due to choking on the breakfast  - Chest x-ray shows small right pleural effusion with patchy multifocal right lung opacity, nonspecific but suspicious for acute bronchopneumonia - for now place on Zithromax, Rocephin, -Urine strep antigen negative, pro-calcitonin <0.1   Code Status: full DVT Prophylaxis:  SCD's Family Communication: Discussed in detail with the patient, all imaging results, lab results  explained to the patient  Disposition Plan:   Time Spent in minutes   25 minutes  Procedures:  CTA chest  Consultants:   Cardiology GI Pulmonology  Antimicrobials:   IV Zithromax  IV Rocephin   Medications  Scheduled Meds: . azithromycin  500 mg Intravenous Q24H  . cefTRIAXone (ROCEPHIN)  IV  1 g Intravenous Q24H  . enalapril  2.5 mg Oral Daily  . ezetimibe  10 mg Oral Daily  . furosemide  40 mg Intravenous Q12H  . potassium chloride SA  20 mEq Oral Daily  . sodium chloride flush  3 mL Intravenous Q12H  . tamsulosin  0.4 mg Oral Daily   Continuous Infusions: PRN Meds:.sodium chloride, acetaminophen, HYDROcodone-acetaminophen, ondansetron (ZOFRAN) IV, sodium chloride flush   Antibiotics   Anti-infectives    Start     Dose/Rate Route Frequency Ordered Stop   05/05/16 1600  azithromycin (ZITHROMAX) 500 mg in dextrose 5 % 250 mL IVPB     500 mg 250 mL/hr over 60 Minutes Intravenous Every 24 hours 05/04/16 1854     05/05/16 1600  cefTRIAXone (ROCEPHIN) 1 g in dextrose 5 % 50 mL IVPB     1 g 100 mL/hr over 30 Minutes Intravenous Every 24 hours 05/04/16 1854     05/04/16 1545  cefTRIAXone (ROCEPHIN) injection 1 g     1 g Intramuscular  Once 05/04/16 1535 05/04/16 1550   05/04/16 1545  azithromycin (ZITHROMAX) 500 mg in dextrose 5 % 250 mL IVPB     500 mg 250 mL/hr over 60 Minutes Intravenous  Once 05/04/16 1535 05/04/16 1730        Subjective:   Steven Krause was seen and examined today.  Still significant peripheral edema although shortness of breath is improving. No chest tightness this morning. No fevers or chills. Patient denies dizziness, abdominal pain, N/V/D/C, new weakness, numbess, tingling. No acute events overnight.    Objective:   Vitals:   05/04/16 2037 05/05/16 0032 05/05/16 0500 05/05/16 1024  BP: 137/74 (!) 116/56 (!) 124/50 (!) 119/57  Pulse: 66 (!) 58 (!) 43 (!) 52  Resp: '20 20 20   '$ Temp: 97.4 F (36.3 C) 98.4 F (36.9 C) 98 F (36.7  C) 98.1 F (36.7 C)  TempSrc: Oral Oral Oral Oral  SpO2: 96% 95% 93% 95%  Weight:   96.3 kg (212 lb 4.8 oz)   Height:        Intake/Output Summary (Last 24 hours) at 05/05/16 1135 Last data filed at 05/05/16 1031  Gross per 24 hour  Intake             1210 ml  Output             1925 ml  Net             -715  ml     Wt Readings from Last 3 Encounters:  05/05/16 96.3 kg (212 lb 4.8 oz)  04/18/16 93.9 kg (207 lb)  12/25/15 88.9 kg (196 lb)     Exam  General: Alert and oriented x 3, NAD  HEENT:    Neck: Supple, no JVD, no masses  Cardiovascular: S1 S2 auscultated, no rubs, murmurs or gallops. Regular rate and rhythm.  Respiratory: Clear to auscultation bilaterally, no wheezing, rales or rhonchi  Gastrointestinal: Soft, nontender, nondistended, + bowel sounds  Ext: no cyanosis clubbing, 2+ edema  Neuro: AAOx3, Cr N's II- XII. Strength 5/5 upper and lower extremities bilaterally  Skin: No rashes  Psych: Normal affect and demeanor, alert and oriented x3    Data Reviewed:  I have personally reviewed following labs and imaging studies  Micro Results No results found for this or any previous visit (from the past 240 hour(s)).  Radiology Reports Dg Chest 2 View  Result Date: 05/04/2016 CLINICAL DATA:  76 year old male with weakness, dizziness, shortness of breath. Initial encounter. EXAM: CHEST  2 VIEW COMPARISON:  12/16/2013 and earlier. FINDINGS: Mild cardiomegaly is new since 2015. Other mediastinal contours are within normal limits. Visualized tracheal air column is within normal limits. No pneumothorax or pulmonary edema. There is a small right pleural effusion with patchy mostly middle lobe right lung opacity. There is also subtle new right perihilar patchy opacity (arrows). Mild linear opacity at the left lung base most resembles atelectasis. Chronic abdominal surgical clips. Negative visible bowel gas pattern. No acute osseous abnormality identified. IMPRESSION:  1. Small right pleural effusion with patchy multifocal right lung opacity which is nonspecific but suspicious for acute bronchopneumonia. 2. Mild cardiomegaly since 2015. Electronically Signed   By: Genevie Ann M.D.   On: 05/04/2016 14:44   Ct Angio Chest Pe W Or Wo Contrast  Result Date: 05/04/2016 CLINICAL DATA:  Worsening chest pain for 1 week. EXAM: CT ANGIOGRAPHY CHEST WITH CONTRAST TECHNIQUE: Multidetector CT imaging of the chest was performed using the standard protocol during bolus administration of intravenous contrast. Multiplanar CT image reconstructions and MIPs were obtained to evaluate the vascular anatomy. CONTRAST:  100 cc Isovue 370 IV COMPARISON:  Chest radiograph earlier this day FINDINGS: Cardiovascular: There are no filling defects within the pulmonary arteries to suggest pulmonary embolus. Mild multi chamber cardiomegaly. There is atherosclerosis of the thoracic aorta without aneurysm. Mediastinum/Nodes: Bulky right hilar adenopathy, node measures 3.4 cm. Enlarged lower paratracheal node suspected measuring 13 mm short axis, however motion artifact is present. There is a prominent sub- carinal node measuring 10 mm. No left hilar adenopathy. No definite supraclavicular adenopathy. Peripherally calcified nodule in the right lobe of the thyroid gland measures 15 mm. The esophagus is decompressed. Lungs/Pleura: Central right upper lobe pulmonary nodule measures 2.3 x 1.7 cm with ill-defined margins. There is perihilar ill-defined ground-glass opacity in the upper lobe, however breathing motion in this region, difficult to exclude involvement of superior segment of the right lower lobe. This ground-glass opacity appears multifocal but centered around the central airways. There are a few nodular opacities in the right lower lobe for example 6 x 8 mm nodule image 86 series 407. Small right lower lobe nodules more inferiorly. Ill-defined density in the anterior most right lower lobe about the  costophrenic sulcus. There is a 2.1 x 2.0 cm soft tissue density abutting the mediastinum in the right middle lobe image 102 series 407. Within the lingula there is nonspecific 1.7 x 2.3 cm nodularity abutting  the pleura. Ill-defined subpleural nodularity in the anterior left upper lobe image 72 series 407. There small bilateral pleural effusions. Upper Abdomen: No evidence of adrenal nodule. No definite acute abnormality. Laxity of the anterior abdominal wall with probable ventral abdominal wall hernia is only partially included. Musculoskeletal: There are no acute or suspicious osseous abnormalities. No blastic or destructive lytic lesions. Review of the MIP images confirms the above findings. IMPRESSION: 1. Irregular 2.3 cm right upper lobe pulmonary nodule suspicious for primary bronchogenic malignancy. There is bulky right hilar and mild mediastinal adenopathy. 2. Right upper lobe perihilar ground-glass opacities are nonspecific, a be neoplastic, infectious or inflammatory. 3. Paramediastinal soft tissue densities in the lingula and right middle lobe, left upper and right lower lobe pulmonary nodules are also nonspecific. 4. Small pleural effusions. 5. No pulmonary embolus. These results will be called to the ordering clinician or representative by the Radiologist Assistant, and communication documented in the PACS or zVision Dashboard. Electronically Signed   By: Jeb Levering M.D.   On: 05/04/2016 22:20    Lab Data:  CBC:  Recent Labs Lab 05/04/16 1454 05/04/16 1731 05/05/16 1009  WBC 7.4  --  8.8  NEUTROABS  --  6.5  --   HGB 9.2*  --  8.8*  HCT 29.8*  --  28.4*  MCV 82.3  --  82.1  PLT 449*  --  366*   Basic Metabolic Panel:  Recent Labs Lab 05/04/16 1454 05/05/16 0710  NA 133* 131*  K 3.5 3.6  CL 93* 91*  CO2 30 33*  GLUCOSE 115* 105*  BUN 15 17  CREATININE 1.16 1.09  CALCIUM 8.9 8.4*   GFR: Estimated Creatinine Clearance: 67 mL/min (by C-G formula based on SCr of 1.09  mg/dL). Liver Function Tests: No results for input(s): AST, ALT, ALKPHOS, BILITOT, PROT, ALBUMIN in the last 168 hours. No results for input(s): LIPASE, AMYLASE in the last 168 hours. No results for input(s): AMMONIA in the last 168 hours. Coagulation Profile: No results for input(s): INR, PROTIME in the last 168 hours. Cardiac Enzymes:  Recent Labs Lab 05/04/16 1900 05/05/16 0114 05/05/16 0710  TROPONINI <0.03 <0.03 <0.03   BNP (last 3 results) No results for input(s): PROBNP in the last 8760 hours. HbA1C: No results for input(s): HGBA1C in the last 72 hours. CBG: No results for input(s): GLUCAP in the last 168 hours. Lipid Profile: No results for input(s): CHOL, HDL, LDLCALC, TRIG, CHOLHDL, LDLDIRECT in the last 72 hours. Thyroid Function Tests: No results for input(s): TSH, T4TOTAL, FREET4, T3FREE, THYROIDAB in the last 72 hours. Anemia Panel:  Recent Labs  05/04/16 1900  VITAMINB12 262  FOLATE 10.4  FERRITIN 13*  TIBC 497*  IRON 11*  RETICCTPCT 2.5   Urine analysis:    Component Value Date/Time   BILIRUBINUR Neg 12/16/2013 1328   PROTEINUR Trace 12/16/2013 1328   UROBILINOGEN 0.2 12/16/2013 1328   NITRITE Neg 12/16/2013 1328   LEUKOCYTESUR Negative 12/16/2013 1328     Eliese Kerwood M.D. [facility] 05/05/2016, 11:35 AM  Pager: [phone] Between 7am to 7pm - call Pager - [phone]  After 7pm go to [email or web address] - password [email or web address]  Call night coverage person covering after 7pm

## 2016-05-05 NOTE — Progress Notes (Signed)
D-dimer = 1.10. MD paged. Orders received for CT Angio. CT Angio was negative for PE, but shows pulmonary nodules. Will continue to monitor.

## 2016-05-05 NOTE — Consult Note (Signed)
[name]: 2:12 PM 05/05/2016  LOS: 1 day    Referring Provider: Dr Tana Coast  Primary Care Physician:  Hollace Kinnier, DO Primary Gastroenterologist:  Dr. Delfin Edis     Reason for Consultation:  Anemia, FOBT +   HPI: Steven Krause is a 76 y.o. male.  PMH Afib on Xarelto.  Polycythemia. HTN.  HLD. S/P open cholecystectomy complicated by injury to the colon which required a temporary colostomy. Remote. Ventral hernia  2009 colonoscopy. Normal 07/2012 screening colonoscopy for family history of colon cancer in his mother.  Small internal hemorrhoids, otherwise normal study.  In the last 2 weeks he's had progressive peripheral edema and DOE.  He had an incident of chest pressure after walking about 1000 feet to his apartment complex mail box. It eased off with rest and it occurred again once he walked back to the house initally felt well on awakening yesterday AM.  While eating breakfast he coughed and felt choking but regurgitated the food. After that he was having chest pressure and was unable to catch his breath. Having nonradiating chest pressure.    CT angiogram of the chest showing RUL pulmonary nodule suspicious for malignancy. Nonspecific right lung nodularity.  Nonspecific RUL opacities Small pleural effusions. Troponins negative times 3. No ischemic EKG changes. Hgb 15 in 08/2015.  12.9 in 12/2015.  10.2 on 04/18/16.  9.2 2/28.  8.8 today. FOBT +.   Patient's bowels move daily. He never sees blood or melena. He does not have dysphagia, heartburn, abdominal pain. Weight fluctuates but is essentially stable. He says he takes 2 aspirin a day but this is not reflected in his med list. Doesn't use NSAIDs. Doesn't drink. Doesn't chew or smoke tobacco.  Past Medical History:  Diagnosis Date  . Abdominal  pain, generalized   . Acquired polycythemia 01/05/2011  . Allergy   . Diarrhea   . Dizziness and giddiness   . Hernia of unspecified site of abdominal cavity without mention of obstruction or gangrene   . Hypertension   . Hypopotassemia   . Iron excess 01/05/2011  . Memory loss   . Other specified cardiac dysrhythmias(427.89)   . 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 Polycythemia, secondary   . Systemic hypertension   . Tobacco use disorder   . Unspecified disorder of kidney and ureter   . Unspecified disorder of skin and subcutaneous tissue   . Unspecified hearing loss     Past Surgical History:  Procedure Laterality Date  . CHOLECYSTECTOMY    . INFLAMED COLON    . NM MYOCAR PERF WALL MOTION  8//30/11   normal  . US ECHOCARDIOGRAPHY  11/03/09   EF 50-55%,  . VENTRAL HERNIA REPAIR      Prior to Admission medications   Medication Sig Start Date End Date Taking? Authorizing Provider  amLODipine (NORVASC) 10 MG tablet TAKE 1 TABLET BY MOUTH EVERY DAY 03/14/16  Yes [name] L Reed, DO  furosemide (LASIX) 40 MG tablet TAKE 1 TABLET IN THE MORNING AND TAKE 1 TABLET IN THE EVENING 03/14/16  Yes  [name] L Reed, DO  HYDROcodone-acetaminophen (NORCO) 5-325 MG tablet Take 1 tablet by mouth every 6 (six) hours as needed for moderate pain. 04/18/16  Yes [name] L Reed, DO  potassium chloride SA (K-DUR,KLOR-CON) 20 MEQ tablet Take 1 tablet (20 mEq total) by mouth daily. 12/28/15  Yes [name] L Reed, DO  spironolactone (ALDACTONE) 25 MG tablet Take 1 tablet (25 mg total) by mouth daily. 04/18/16  Yes [name] L Reed, DO  tamsulosin (FLOMAX) 0.4 MG CAPS capsule TAKE ONE CAPSULE BY MOUTH ONCE DAILY 03/14/16  Yes [name] L Reed, DO  XARELTO 20 MG TABS tablet TAKE 1 TABLET BY MOUTH EVERY DAY WITH SUPPER 04/14/16  Yes Mihai Croitoru, MD  ZETIA 10 MG tablet TAKE 1 TABLET BY MOUTH EVERY DAY 04/13/16  Yes [name] L Reed, DO    Scheduled Meds: . azithromycin  500 mg Intravenous Q24H  . cefTRIAXone (ROCEPHIN)  IV  1 g  Intravenous Q24H  . ezetimibe  10 mg Oral Daily  . furosemide  40 mg Intravenous Q12H  . potassium chloride SA  20 mEq Oral Daily  . sodium chloride flush  3 mL Intravenous Q12H  . tamsulosin  0.4 mg Oral Daily   Infusions:  PRN Meds: sodium chloride, acetaminophen, HYDROcodone-acetaminophen, ondansetron (ZOFRAN) IV, sodium chloride flush   Allergies as of 05/04/2016 - Review Complete 05/04/2016  Allergen Reaction Noted  . Statins  08/25/2014    Family History  Problem Relation Age of Onset  . Colon polyps Mother 59  . Colon cancer Mother   . Heart disease Father   . Diabetes Sister   . Cancer Brother   . Diabetes Sister   . Diabetes Sister   . Heart disease Sister   . Stomach cancer Neg Hx   . Rectal cancer Neg Hx     Social History   Social History  . Marital status: Single    Spouse name: N/A  . Number of children: N/A  . Years of education: N/A   Occupational History  . Not on file.   Social History Main Topics  . Smoking status: Former Smoker    Packs/day: 0.25    Years: 55.00    Types: Pipe    Quit date: 05/01/2016  . Smokeless tobacco: Never Used  . Alcohol use No  . Drug use: No  . Sexual activity: Not Currently   Other Topics Concern  . Not on file   Social History Narrative   Friend [name] is contact and helps with his finances, appts, etc.    REVIEW OF SYSTEMS: Constitutional:  Fatigue. ENT:  No nose bleeds Pulm:  Per HPI CV:  No palpitations, no LE edema.  GU:  No hematuria, no frequency GI:  Per HPI.  Heme:  No unusual bleeding or bruising   Transfusions:  None Neuro:  No headaches, no peripheral tingling or numbness Derm:  No itching, no rash or sores.  Endocrine:  No sweats or chills.  No polyuria or dysuria Immunization:  Current on flu shot. Travel:  None beyond local counties in last few months.    PHYSICAL EXAM: Vital signs in last 24 hours: Vitals:   05/05/16 1024 05/05/16 1333  BP: (!) 119/57 (!) 107/51  Pulse: (!)  52 63  Resp:  18  Temp: 98.1 F (36.7 C) 98 F (36.7 C)   Wt Readings from Last 3 Encounters:  05/05/16 96.3 kg (212 lb 4.8 oz)  04/18/16 93.9 kg (207 lb)  12/25/15 88.9 kg (196 lb)  General: Pleasant, elderly WM. Doesn't look ill Head:  No facial asymmetry or swelling. No signs of head trauma.  Eyes:  No scleral icterus, no conjunctival pallor. Ears:  Not hard of hearing  Nose:  Congestion or discharge. Mouth:  Moist, clear oral mucosa. Tongue midline. Very poor dentition about 6 or less teeth remain in all of these are riddled with caries. Neck:  No JVD, no thyromegaly, no mass Lungs:  Some fine, dry crackles in the bases. No cough. No dyspnea. Heart: Irreg, irreg. No MRG. S1, S2 present Abdomen:  Soft. Not tender. Massive transverse scar from previous surgeries well-healed. Incisional/ventral hernia. Active bowel sounds. No bruits..   Rectal: Deferred   Musc/Skeltl: No obvious joint erythema or swelling. Some arthritic changes in the fingers and knees. Extremities:  2 - 3+ Bil LE edema.   Neurologic:  Alert. Slightly hard of hearing. Oriented times 3. Good historian. No tremor. Moves all 4 limbs, limb strength not tested. Skin:  No telangiectasia, rashes or suspicious lesions Tattoos:  None. Nodes:  No cervical adenopathy.   Psych:  Pleasant, in good spirits, calm, cooperative.  Intake/Output from previous day: 02/28 0701 - 03/01 0700 In: 970 [P.O.:720; IV Piggyback:250] Out: 1425 [Urine:1425] Intake/Output this shift: Total I/O In: 480 [P.O.:480] Out: 600 [Urine:600]  LAB RESULTS:  Recent Labs  05/04/16 1454 05/05/16 1009  WBC 7.4 8.8  HGB 9.2* 8.8*  HCT 29.8* 28.4*  PLT 449* 521*   BMET Lab Results  Component Value Date   NA 131 (L) 05/05/2016   NA 133 (L) 05/04/2016   NA 137 04/18/2016   K 3.6 05/05/2016   K 3.5 05/04/2016   K 3.5 04/18/2016   CL 91 (L) 05/05/2016   CL 93 (L) 05/04/2016   CL 95 (L) 04/18/2016   CO2 33 (H) 05/05/2016   CO2 30  05/04/2016   CO2 33 (H) 04/18/2016   GLUCOSE 105 (H) 05/05/2016   GLUCOSE 115 (H) 05/04/2016   GLUCOSE 105 (H) 04/18/2016   BUN 17 05/05/2016   BUN 15 05/04/2016   BUN 14 04/18/2016   CREATININE 1.09 05/05/2016   CREATININE 1.16 05/04/2016   CREATININE 1.03 04/18/2016   CALCIUM 8.4 (L) 05/05/2016   CALCIUM 8.9 05/04/2016   CALCIUM 8.9 04/18/2016   LFT No results for input(s): PROT, ALBUMIN, AST, ALT, ALKPHOS, BILITOT, BILIDIR, IBILI in the last 72 hours. PT/INR No results found for: INR, PROTIME Hepatitis Panel No results for input(s): HEPBSAG, HCVAB, HEPAIGM, HEPBIGM in the last 72 hours. C-Diff No components found for: CDIFF Lipase     Component Value Date/Time   LIPASE 30 12/15/2013 1713    Drugs of Abuse  No results found for: LABOPIA, COCAINSCRNUR, LABBENZ, AMPHETMU, THCU, LABBARB   RADIOLOGY STUDIES: Dg Chest 2 View  Result Date: 05/04/2016 CLINICAL DATA:  76 year old male with weakness, dizziness, shortness of breath. Initial encounter. EXAM: CHEST  2 VIEW COMPARISON:  12/16/2013 and earlier. FINDINGS: Mild cardiomegaly is new since 2015. Other mediastinal contours are within normal limits. Visualized tracheal air column is within normal limits. No pneumothorax or pulmonary edema. There is a small right pleural effusion with patchy mostly middle lobe right lung opacity. There is also subtle new right perihilar patchy opacity (arrows). Mild linear opacity at the left lung base most resembles atelectasis. Chronic abdominal surgical clips. Negative visible bowel gas pattern. No acute osseous abnormality identified. IMPRESSION: 1. Small right pleural effusion with patchy multifocal right lung opacity which is nonspecific but suspicious for acute bronchopneumonia.  2. Mild cardiomegaly since 2015. Electronically Signed   By: Genevie Ann M.D.   On: 05/04/2016 14:44   Ct Angio Chest Pe W Or Wo Contrast  Result Date: 05/04/2016 CLINICAL DATA:  Worsening chest pain for 1 week. EXAM:  CT ANGIOGRAPHY CHEST WITH CONTRAST TECHNIQUE: Multidetector CT imaging of the chest was performed using the standard protocol during bolus administration of intravenous contrast. Multiplanar CT image reconstructions and MIPs were obtained to evaluate the vascular anatomy. CONTRAST:  100 cc Isovue 370 IV COMPARISON:  Chest radiograph earlier this day FINDINGS: Cardiovascular: There are no filling defects within the pulmonary arteries to suggest pulmonary embolus. Mild multi chamber cardiomegaly. There is atherosclerosis of the thoracic aorta without aneurysm. Mediastinum/Nodes: Bulky right hilar adenopathy, node measures 3.4 cm. Enlarged lower paratracheal node suspected measuring 13 mm short axis, however motion artifact is present. There is a prominent sub- carinal node measuring 10 mm. No left hilar adenopathy. No definite supraclavicular adenopathy. Peripherally calcified nodule in the right lobe of the thyroid gland measures 15 mm. The esophagus is decompressed. Lungs/Pleura: Central right upper lobe pulmonary nodule measures 2.3 x 1.7 cm with ill-defined margins. There is perihilar ill-defined ground-glass opacity in the upper lobe, however breathing motion in this region, difficult to exclude involvement of superior segment of the right lower lobe. This ground-glass opacity appears multifocal but centered around the central airways. There are a few nodular opacities in the right lower lobe for example 6 x 8 mm nodule image 86 series 407. Small right lower lobe nodules more inferiorly. Ill-defined density in the anterior most right lower lobe about the costophrenic sulcus. There is a 2.1 x 2.0 cm soft tissue density abutting the mediastinum in the right middle lobe image 102 series 407. Within the lingula there is nonspecific 1.7 x 2.3 cm nodularity abutting the pleura. Ill-defined subpleural nodularity in the anterior left upper lobe image 72 series 407. There small bilateral pleural effusions. Upper Abdomen:  No evidence of adrenal nodule. No definite acute abnormality. Laxity of the anterior abdominal wall with probable ventral abdominal wall hernia is only partially included. Musculoskeletal: There are no acute or suspicious osseous abnormalities. No blastic or destructive lytic lesions. Review of the MIP images confirms the above findings. IMPRESSION: 1. Irregular 2.3 cm right upper lobe pulmonary nodule suspicious for primary bronchogenic malignancy. There is bulky right hilar and mild mediastinal adenopathy. 2. Right upper lobe perihilar ground-glass opacities are nonspecific, a be neoplastic, infectious or inflammatory. 3. Paramediastinal soft tissue densities in the lingula and right middle lobe, left upper and right lower lobe pulmonary nodules are also nonspecific. 4. Small pleural effusions. 5. No pulmonary embolus. These results will be called to the ordering clinician or representative by the Radiologist Assistant, and communication documented in the PACS or zVision Dashboard. Electronically Signed   By: Jeb Levering M.D.   On: 05/04/2016 22:20     IMPRESSION:   *  Progressive normocytic anemia with FOBT positive stool but no overt bleeding. On 2 colonoscopies, 2009 and 07/2012 he had only internal hemorrhoids. GI review of systems is unrevealing: Has not had any gross bleeding. No heartburn. No worrisome weight loss.  *  Exertional chest pressure and progressive dyspnea. EKG and cardiac enzymes normal. Cardiology planning cardiac catheterization tomorrow  *  Right pulmonary nodule, suspicious for malignancy. Antibiotics in place for question CAP.  *  Large ventral/incisional hernia. Asymptomatic.   PLAN:     *  Proceed with cardiac cath on 3/2. Dr. Hilarie Fredrickson  will see the patient and decide on a endoscopic workup.   Azucena Freed  05/05/2016, 2:12 PM Pager: [phone]

## 2016-05-06 ENCOUNTER — Encounter (HOSPITAL_COMMUNITY)
Admission: EM | Disposition: A | Discharge status: Home / Self Care | Payer: Self-pay | Source: Home / Self Care | Attending: Internal Medicine

## 2016-05-06 DIAGNOSIS — R195 Other fecal abnormalities: Secondary | ICD-10-CM

## 2016-05-06 DIAGNOSIS — D649 Anemia, unspecified: Secondary | ICD-10-CM

## 2016-05-06 DIAGNOSIS — I2510 Atherosclerotic heart disease of native coronary artery without angina pectoris: Secondary | ICD-10-CM

## 2016-05-06 DIAGNOSIS — I208 Other forms of angina pectoris: Secondary | ICD-10-CM

## 2016-05-06 HISTORY — PX: LEFT HEART CATH AND CORONARY ANGIOGRAPHY: CATH118249

## 2016-05-06 LAB — BASIC METABOLIC PANEL
Anion gap: 9 (ref 5–15)
BUN: 22 mg/dL — ABNORMAL HIGH (ref 6–20)
CO2: 29 mmol/L (ref 22–32)
CREATININE: 1.14 mg/dL (ref 0.61–1.24)
Calcium: 8.9 mg/dL (ref 8.9–10.3)
Chloride: 93 mmol/L — ABNORMAL LOW (ref 101–111)
GFR calc non Af Amer: >60 mL/min (ref >60)
GLUCOSE: 106 mg/dL — AB (ref 65–99)
Potassium: 4 mmol/L (ref 3.5–5.1)
Sodium: 131 mmol/L — ABNORMAL LOW (ref 135–145)

## 2016-05-06 LAB — CBC
HEMATOCRIT: 28 % — AB (ref 39.0–52.0)
HEMOGLOBIN: 8.6 g/dL — AB (ref 13.0–17.0)
MCH: 25 pg — AB (ref 26.0–34.0)
MCHC: 30.7 g/dL (ref 30.0–36.0)
MCV: 81.4 fL (ref 78.0–100.0)
Platelets: 466 10*3/uL — ABNORMAL HIGH (ref 150–400)
RBC: 3.44 MIL/uL — AB (ref 4.22–5.81)
RDW: 15.1 % (ref 11.5–15.5)
WBC: 8.9 10*3/uL (ref 4.0–10.5)

## 2016-05-06 LAB — PROTIME-INR
INR: 1.21
Prothrombin Time: 15.3 seconds — ABNORMAL HIGH (ref 11.4–15.2)

## 2016-05-06 LAB — BRAIN NATRIURETIC PEPTIDE: B NATRIURETIC PEPTIDE 5: 251.1 pg/mL — AB (ref 0.0–100.0)

## 2016-05-06 LAB — PROCALCITONIN: Procalcitonin: <0.1 ng/mL

## 2016-05-06 SURGERY — LEFT HEART CATH AND CORONARY ANGIOGRAPHY
Anesthesia: LOCAL

## 2016-05-06 MED ORDER — HEPARIN SODIUM (PORCINE) 1000 UNIT/ML IJ SOLN
INTRAMUSCULAR | Status: AC
  Filled 2016-05-06: qty 1

## 2016-05-06 MED ORDER — SODIUM CHLORIDE 0.9% FLUSH
3.0000 mL | INTRAVENOUS | Status: DC | PRN
  Administered 2016-05-10: 3 mL via INTRAVENOUS
  Filled 2016-05-06: qty 3

## 2016-05-06 MED ORDER — IOPAMIDOL (ISOVUE-370) INJECTION 76%
INTRAVENOUS | Status: DC | PRN
  Administered 2016-05-06: 70 mL via INTRA_ARTERIAL

## 2016-05-06 MED ORDER — SODIUM CHLORIDE 0.9 % IV SOLN: 250.0000 mL | INTRAVENOUS | Status: DC | PRN

## 2016-05-06 MED ORDER — MIDAZOLAM HCL 2 MG/2ML IJ SOLN
INTRAMUSCULAR | Status: DC | PRN
  Administered 2016-05-06: 1 mg via INTRAVENOUS

## 2016-05-06 MED ORDER — SODIUM CHLORIDE 0.9 % IV SOLN
INTRAVENOUS | Status: DC | PRN
Start: 2016-05-06 — End: 2016-05-06
  Administered 2016-05-06: 10 mL/h via INTRAVENOUS

## 2016-05-06 MED ORDER — HEPARIN (PORCINE) IN NACL 2-0.9 UNIT/ML-% IJ SOLN
INTRAMUSCULAR | Status: AC
Start: 2016-05-06 — End: 2016-05-06
  Filled 2016-05-06: qty 1000

## 2016-05-06 MED ORDER — SODIUM CHLORIDE 0.9% FLUSH
3.0000 mL | Freq: Two times a day (BID) | INTRAVENOUS | Status: DC
  Administered 2016-05-07 – 2016-05-12 (×6): 3 mL via INTRAVENOUS

## 2016-05-06 MED ORDER — LIDOCAINE HCL (PF) 1 % IJ SOLN
INTRAMUSCULAR | Status: DC | PRN
  Administered 2016-05-06: 2 mL

## 2016-05-06 MED ORDER — VERAPAMIL HCL 2.5 MG/ML IV SOLN
INTRAVENOUS | Status: DC | PRN
  Administered 2016-05-06: 10 mL via INTRA_ARTERIAL

## 2016-05-06 MED ORDER — FENTANYL CITRATE (PF) 100 MCG/2ML IJ SOLN
INTRAMUSCULAR | Status: AC
  Filled 2016-05-06: qty 2

## 2016-05-06 MED ORDER — LIDOCAINE HCL (PF) 1 % IJ SOLN
INTRAMUSCULAR | Status: AC
  Filled 2016-05-06: qty 30

## 2016-05-06 MED ORDER — HEPARIN SODIUM (PORCINE) 1000 UNIT/ML IJ SOLN
INTRAMUSCULAR | Status: DC | PRN
  Administered 2016-05-06: 4500 [IU] via INTRAVENOUS

## 2016-05-06 MED ORDER — FENTANYL CITRATE (PF) 100 MCG/2ML IJ SOLN
INTRAMUSCULAR | Status: DC | PRN
  Administered 2016-05-06: 25 ug via INTRAVENOUS

## 2016-05-06 MED ORDER — SODIUM CHLORIDE 0.9 % WEIGHT BASED INFUSION
1.0000 mL/kg/h | INTRAVENOUS | Status: AC
  Administered 2016-05-06: 1 mL/kg/h via INTRAVENOUS

## 2016-05-06 MED ORDER — IOPAMIDOL (ISOVUE-370) INJECTION 76%
INTRAVENOUS | Status: AC
  Filled 2016-05-06: qty 100

## 2016-05-06 MED ORDER — VERAPAMIL HCL 2.5 MG/ML IV SOLN
INTRAVENOUS | Status: AC
  Filled 2016-05-06: qty 2

## 2016-05-06 MED ORDER — MIDAZOLAM HCL 2 MG/2ML IJ SOLN
INTRAMUSCULAR | Status: AC
  Filled 2016-05-06: qty 2

## 2016-05-06 MED ORDER — HEPARIN (PORCINE) IN NACL 2-0.9 UNIT/ML-% IJ SOLN
INTRAMUSCULAR | Status: DC | PRN
  Administered 2016-05-06: 1000 mL

## 2016-05-06 SURGICAL SUPPLY — 9 items
CATH EXPO 5FR FR4 (CATHETERS) ×2 IMPLANT
CATH INFINITI 5 FR JL3.5 (CATHETERS) ×2 IMPLANT
GLIDESHEATH SLEND SS 6F .021 (SHEATH) ×2 IMPLANT
GUIDEWIRE INQWIRE 1.5J.035X260 (WIRE) ×1 IMPLANT
INQWIRE 1.5J .035X260CM (WIRE) ×2
KIT HEART LEFT (KITS) ×2 IMPLANT
PACK CARDIAC CATHETERIZATION (CUSTOM PROCEDURE TRAY) ×2 IMPLANT
TRANSDUCER W/STOPCOCK (MISCELLANEOUS) ×2 IMPLANT
TUBING CIL FLEX 10 FLL-RA (TUBING) ×2 IMPLANT

## 2016-05-06 NOTE — Progress Notes (Signed)
Progress Note  Patient Name: Steven Krause Date of Encounter: 05/06/2016  Primary Cardiologist: Dr. Sallyanne Kuster  Subjective   Denies chest pain. Breathing is improved. Still has orthopnea. Still has edema to the knees. Pt had tele monitor off. Staff said he was refusing it. It talked to him and he agrees to wear it now.  Inpatient Medications    Scheduled Meds: . azithromycin  500 mg Intravenous Q24H  . cefTRIAXone (ROCEPHIN)  IV  1 g Intravenous Q24H  . ezetimibe  10 mg Oral Daily  . furosemide  40 mg Intravenous Q12H  . iron polysaccharides  150 mg Oral Daily  . potassium chloride SA  20 mEq Oral Daily  . sodium chloride flush  3 mL Intravenous Q12H  . sodium chloride flush  3 mL Intravenous Q12H  . tamsulosin  0.4 mg Oral Daily   Continuous Infusions: . sodium chloride     PRN Meds: sodium chloride, sodium chloride, acetaminophen, HYDROcodone-acetaminophen, ondansetron (ZOFRAN) IV, sodium chloride flush, sodium chloride flush   Vital Signs    Vitals:   05/05/16 1024 05/05/16 1333 05/05/16 2135 05/06/16 0502  BP: (!) 119/57 (!) 107/51 (!) 107/56 140/72  Pulse: (!) 52 63 96 (!) 52  Resp:  '18 18 18  '$ Temp: 98.1 F (36.7 C) 98 F (36.7 C) 98.1 F (36.7 C)   TempSrc: Oral Oral Oral Oral  SpO2: 95% 98% 92% 93%  Weight:    211 lb 9.6 oz (96 kg)  Height:        Intake/Output Summary (Last 24 hours) at 05/06/16 0849 Last data filed at 05/06/16 5631  Gross per 24 hour  Intake             1160 ml  Output              700 ml  Net              460 ml   Filed Weights   05/04/16 1835 05/05/16 0500 05/06/16 0502  Weight: 212 lb 8 oz (96.4 kg) 212 lb 4.8 oz (96.3 kg) 211 lb 9.6 oz (96 kg)    Telemetry    Atrial fib in the 40's and 50's until 2200 when pt removed monitor. Now convinced to leave monitor on. - Personally Reviewed  ECG    No new tracing  Physical Exam   GEN: No acute distress.   Neck: No JVD Cardiac: RRR, no murmurs, rubs, or gallops.    Respiratory: Clear to auscultation bilaterally. Diminished GI: Soft, nontender, large abdominal hernias present. Transverse scar noted. MS: 3+ pitting edema up to knees. No deformity. Neuro:  Nonfocal  Psych: Normal affect   Labs    Chemistry Recent Labs Lab 05/04/16 1454 05/05/16 0710 05/06/16 0452  NA 133* 131* 131*  K 3.5 3.6 4.0  CL 93* 91* 93*  CO2 30 33* 29  GLUCOSE 115* 105* 106*  BUN 15 17 22*  CREATININE 1.16 1.09 1.14  CALCIUM 8.9 8.4* 8.9  GFRNONAA 60* >60 >60  GFRAA >60 >60 >60  ANIONGAP '10 7 9     '$ Hematology Recent Labs Lab 05/04/16 1454 05/04/16 1900 05/05/16 1009 05/06/16 0452  WBC 7.4  --  8.8 8.9  RBC 3.62* 3.59* 3.46* 3.44*  HGB 9.2*  --  8.8* 8.6*  HCT 29.8*  --  28.4* 28.0*  MCV 82.3  --  82.1 81.4  MCH 25.4*  --  25.4* 25.0*  MCHC 30.9  --  31.0 30.7  RDW  15.1  --  14.9 15.1  PLT 449*  --  521* 466*    Cardiac Enzymes Recent Labs Lab 05/04/16 1900 05/05/16 0114 05/05/16 0710  TROPONINI <0.03 <0.03 <0.03    Recent Labs Lab 05/04/16 1520  TROPIPOC 0.02     BNP Recent Labs Lab 05/04/16 1454 05/06/16 0452  BNP 216.0* 251.1*     DDimer  Recent Labs Lab 05/04/16 1454  DDIMER 1.10*     Radiology    Dg Chest 2 View  Result Date: 05/04/2016 CLINICAL DATA:  76 year old male with weakness, dizziness, shortness of breath. Initial encounter. EXAM: CHEST  2 VIEW COMPARISON:  12/16/2013 and earlier. FINDINGS: Mild cardiomegaly is new since 2015. Other mediastinal contours are within normal limits. Visualized tracheal air column is within normal limits. No pneumothorax or pulmonary edema. There is a small right pleural effusion with patchy mostly middle lobe right lung opacity. There is also subtle new right perihilar patchy opacity (arrows). Mild linear opacity at the left lung base most resembles atelectasis. Chronic abdominal surgical clips. Negative visible bowel gas pattern. No acute osseous abnormality identified. IMPRESSION:  1. Small right pleural effusion with patchy multifocal right lung opacity which is nonspecific but suspicious for acute bronchopneumonia. 2. Mild cardiomegaly since 2015. Electronically Signed   By: Genevie Ann M.D.   On: 05/04/2016 14:44   Ct Angio Chest Pe W Or Wo Contrast  Result Date: 05/04/2016 CLINICAL DATA:  Worsening chest pain for 1 week. EXAM: CT ANGIOGRAPHY CHEST WITH CONTRAST TECHNIQUE: Multidetector CT imaging of the chest was performed using the standard protocol during bolus administration of intravenous contrast. Multiplanar CT image reconstructions and MIPs were obtained to evaluate the vascular anatomy. CONTRAST:  100 cc Isovue 370 IV COMPARISON:  Chest radiograph earlier this day FINDINGS: Cardiovascular: There are no filling defects within the pulmonary arteries to suggest pulmonary embolus. Mild multi chamber cardiomegaly. There is atherosclerosis of the thoracic aorta without aneurysm. Mediastinum/Nodes: Bulky right hilar adenopathy, node measures 3.4 cm. Enlarged lower paratracheal node suspected measuring 13 mm short axis, however motion artifact is present. There is a prominent sub- carinal node measuring 10 mm. No left hilar adenopathy. No definite supraclavicular adenopathy. Peripherally calcified nodule in the right lobe of the thyroid gland measures 15 mm. The esophagus is decompressed. Lungs/Pleura: Central right upper lobe pulmonary nodule measures 2.3 x 1.7 cm with ill-defined margins. There is perihilar ill-defined ground-glass opacity in the upper lobe, however breathing motion in this region, difficult to exclude involvement of superior segment of the right lower lobe. This ground-glass opacity appears multifocal but centered around the central airways. There are a few nodular opacities in the right lower lobe for example 6 x 8 mm nodule image 86 series 407. Small right lower lobe nodules more inferiorly. Ill-defined density in the anterior most right lower lobe about the  costophrenic sulcus. There is a 2.1 x 2.0 cm soft tissue density abutting the mediastinum in the right middle lobe image 102 series 407. Within the lingula there is nonspecific 1.7 x 2.3 cm nodularity abutting the pleura. Ill-defined subpleural nodularity in the anterior left upper lobe image 72 series 407. There small bilateral pleural effusions. Upper Abdomen: No evidence of adrenal nodule. No definite acute abnormality. Laxity of the anterior abdominal wall with probable ventral abdominal wall hernia is only partially included. Musculoskeletal: There are no acute or suspicious osseous abnormalities. No blastic or destructive lytic lesions. Review of the MIP images confirms the above findings. IMPRESSION: 1. Irregular 2.3 cm  right upper lobe pulmonary nodule suspicious for primary bronchogenic malignancy. There is bulky right hilar and mild mediastinal adenopathy. 2. Right upper lobe perihilar ground-glass opacities are nonspecific, a be neoplastic, infectious or inflammatory. 3. Paramediastinal soft tissue densities in the lingula and right middle lobe, left upper and right lower lobe pulmonary nodules are also nonspecific. 4. Small pleural effusions. 5. No pulmonary embolus. These results will be called to the ordering clinician or representative by the Radiologist Assistant, and communication documented in the PACS or zVision Dashboard. Electronically Signed   By: Jeb Levering M.D.   On: 05/04/2016 22:20    Cardiac Studies   ECHO 05/07/2015 Study Conclusions  - Left ventricle: The cavity size was normal. Systolic function was   normal. The estimated ejection fraction was in the range of 60%   to 65%. Wall motion was normal; there were no regional wall   motion abnormalities. Features are consistent with a pseudonormal   left ventricular filling pattern, with concomitant abnormal   relaxation and increased filling pressure (grade 2 diastolic   dysfunction). - Aortic valve: Trileaflet; mildly  thickened, mildly calcified   leaflets. Valve mobility was restricted. There was very mild   stenosis. Peak velocity (S): 248 cm/s. Mean gradient (S): 10 mm   Hg. - Left atrium: The atrium was mildly dilated. - Right atrium: The atrium was mildly dilated. - Pulmonary arteries: Systolic pressure was moderately increased.   PA peak pressure: 51 mm Hg (S).  Patient Profile     76 y.o. male with a PMH significant for rate controlled Afib on xarelto with frequent PVCs, smoker, HTN, anemia, and CHF. He presented to the ED with exertional chest discomfort and dyspnea. Cardiology was consulted for chest pain and possible CHF.   Assessment & Plan    1. Chest pain - EKG without signs of ischemia; troponin x 3 negative - pt has history of HTN, HLD, and smoking and strong family history of heart disease - pt has had chest pain with exertion that is relieved with rest x 3 days. None overnight.  -Echo shows normal LV EF, Grade 2 diastolic dysfunction, no regional WMA -Planned for cardaic cath today for definitive cardiac evaluation in setting of significant change in symptomatology with exertional shortness of breath and significant exertional chest pressure, worrisome for ischemia. His Xarelto is on hold and plan for cath this afternoon after he has been without for 48h. Last dose of Xarelto 2/28 pm - he was not heparinized secondary to anemia on admission  2. Acute diastolic CHF, pleural effusion, LE edema - echocardiogram shows EF 60-65%, Grade 2 DD - BNP 219 on admission. Today BNP 251. - pt presented with pleural effusions and LE edema; however, given his low BNP this volume may be related to pulmonary nodules concerning for bronchogenic malignancy vs PNA - diuresing per primary team with lasix 40 mg IV BID initially diuresed 1.4L, net negative 395cc; weight stable from admission. Has had modest diuresis. Can pursue more aggressively after cardiac cath. - sCr 1.09-->1.14; baseline sCr  0.88-1.03 - K stable (4.0) - albumin is low (3.5), likely contributing to his edema   3. Permanent atrial fibrillation, rate controlled - pt denies dizziness, lightheadedness, and palpitations - last dose of xarelto was 05/04/16 evening - not currently on a beta blocker at home 2/2 bradycardia; ventricular rates in the 60s   3. HTN - home meds: norvasc, spironolactone, lasix - enalapril 2.5 mg started; hold today for cath   4. Positive  D-Dimer - CTA ruled out PE  - given his LE swelling, recommend LE duplex to r/o DVT   5. Anemia - positive FOBT - low iron and ferritin; normal-high TIBC - Hb 8.8 today from 9.2 on admission (previously 10.2 on 04/18/16; 12.9 on 12/25/15) - continue to hold anticoagulation   6. PNA - found on CTA this admission - started on zithromax and rocephin per primary team  Signed, Daune Perch, NP  05/06/2016, 8:49 AM   Pager: [phone] [place] White[place] is a 76 y.o. male  Patient seen and examined. Agree with assessment and plan. Feels better. Pt has consented to undo cardiac cath this afternoon.  He has had a definitive change in symptomology which may be contributed by his anemia. Now on Fe replacement with Fe sat 2% and low ferritin 13; will need GI evaluation. Continues to have 2-3 + LE edema; will not additionally diurese this am with plans for cath later today.  The risks and benefits of a cardiac catheterization including, but not limited to, death, stroke, MI, kidney damage and bleeding were discussed with the patient who indicates understanding and agrees to proceed.    Troy Sine, MD, [facility] 05/06/2016 11:09 AM

## 2016-05-06 NOTE — Progress Notes (Signed)
Daily Rounding Note  05/06/2016, 10:24 AM  LOS: 2 days   SUBJECTIVE:   Chief complaint: chest tightness, DOE.  None since admit but has not challenged himself with activity.  Fine walking around the room.  No BMs     OBJECTIVE:         Vital signs in last 24 hours:    Temp:  [98 F (36.7 C)-98.1 F (36.7 C)] 98.1 F (36.7 C) (03/01 2135) Pulse Rate:  [52-96] 52 (03/02 0502) Resp:  [18] 18 (03/02 0502) BP: (107-140)/(51-72) 140/72 (03/02 0502) SpO2:  [92 %-98 %] 93 % (03/02 0502) Weight:  [96 kg (211 lb 9.6 oz)] 96 kg (211 lb 9.6 oz) (03/02 0502) Last BM Date: 05/05/16 Filed Weights   05/04/16 1835 05/05/16 0500 05/06/16 0502  Weight: 96.4 kg (212 lb 8 oz) 96.3 kg (212 lb 4.8 oz) 96 kg (211 lb 9.6 oz)   General: pleasant, comfortable   Heart: RRR Chest: clear bil.  Abdomen: soft, NT, ND.  Active BS  Extremities: No CCE Neuro/Psych:  Alert, oriented x 3.  No gross deficits.   Intake/Output from previous day: 03/01 0701 - 03/02 0700 In: 1160 [P.O.:1160] Out: 1100 [Urine:1100]  Intake/Output this shift: Total I/O In: 600 [P.O.:600] Out: -   Lab Results:  Recent Labs  05/04/16 1454 05/05/16 1009 05/06/16 0452  WBC 7.4 8.8 8.9  HGB 9.2* 8.8* 8.6*  HCT 29.8* 28.4* 28.0*  PLT 449* 521* 466*   BMET  Recent Labs  05/04/16 1454 05/05/16 0710 05/06/16 0452  NA 133* 131* 131*  K 3.5 3.6 4.0  CL 93* 91* 93*  CO2 30 33* 29  GLUCOSE 115* 105* 106*  BUN 15 17 22*  CREATININE 1.16 1.09 1.14  CALCIUM 8.9 8.4* 8.9   LFT No results for input(s): PROT, ALBUMIN, AST, ALT, ALKPHOS, BILITOT, BILIDIR, IBILI in the last 72 hours. PT/INR  Recent Labs  05/06/16 0452  LABPROT 15.3*  INR 1.21   Hepatitis Panel No results for input(s): HEPBSAG, HCVAB, HEPAIGM, HEPBIGM in the last 72 hours.  Studies/Results: Dg Chest 2 View  Result Date: 05/04/2016 CLINICAL DATA:  76 year old male with weakness,  dizziness, shortness of breath. Initial encounter. EXAM: CHEST  2 VIEW COMPARISON:  12/16/2013 and earlier. FINDINGS: Mild cardiomegaly is new since 2015. Other mediastinal contours are within normal limits. Visualized tracheal air column is within normal limits. No pneumothorax or pulmonary edema. There is a small right pleural effusion with patchy mostly middle lobe right lung opacity. There is also subtle new right perihilar patchy opacity (arrows). Mild linear opacity at the left lung base most resembles atelectasis. Chronic abdominal surgical clips. Negative visible bowel gas pattern. No acute osseous abnormality identified. IMPRESSION: 1. Small right pleural effusion with patchy multifocal right lung opacity which is nonspecific but suspicious for acute bronchopneumonia. 2. Mild cardiomegaly since 2015. Electronically Signed   By: Genevie Ann M.D.   On: 05/04/2016 14:44   Ct Angio Chest Pe W Or Wo Contrast  Result Date: 05/04/2016 CLINICAL DATA:  Worsening chest pain for 1 week. EXAM: CT ANGIOGRAPHY CHEST WITH CONTRAST TECHNIQUE: Multidetector CT imaging of the chest was performed using the standard protocol during bolus administration of intravenous contrast. Multiplanar CT image reconstructions and MIPs were obtained to evaluate the vascular anatomy. CONTRAST:  100 cc Isovue 370 IV COMPARISON:  Chest radiograph earlier this day FINDINGS: Cardiovascular: There are no filling defects within the pulmonary arteries to suggest  pulmonary embolus. Mild multi chamber cardiomegaly. There is atherosclerosis of the thoracic aorta without aneurysm. Mediastinum/Nodes: Bulky right hilar adenopathy, node measures 3.4 cm. Enlarged lower paratracheal node suspected measuring 13 mm short axis, however motion artifact is present. There is a prominent sub- carinal node measuring 10 mm. No left hilar adenopathy. No definite supraclavicular adenopathy. Peripherally calcified nodule in the right lobe of the thyroid gland measures  15 mm. The esophagus is decompressed. Lungs/Pleura: Central right upper lobe pulmonary nodule measures 2.3 x 1.7 cm with ill-defined margins. There is perihilar ill-defined ground-glass opacity in the upper lobe, however breathing motion in this region, difficult to exclude involvement of superior segment of the right lower lobe. This ground-glass opacity appears multifocal but centered around the central airways. There are a few nodular opacities in the right lower lobe for example 6 x 8 mm nodule image 86 series 407. Small right lower lobe nodules more inferiorly. Ill-defined density in the anterior most right lower lobe about the costophrenic sulcus. There is a 2.1 x 2.0 cm soft tissue density abutting the mediastinum in the right middle lobe image 102 series 407. Within the lingula there is nonspecific 1.7 x 2.3 cm nodularity abutting the pleura. Ill-defined subpleural nodularity in the anterior left upper lobe image 72 series 407. There small bilateral pleural effusions. Upper Abdomen: No evidence of adrenal nodule. No definite acute abnormality. Laxity of the anterior abdominal wall with probable ventral abdominal wall hernia is only partially included. Musculoskeletal: There are no acute or suspicious osseous abnormalities. No blastic or destructive lytic lesions. Review of the MIP images confirms the above findings. IMPRESSION: 1. Irregular 2.3 cm right upper lobe pulmonary nodule suspicious for primary bronchogenic malignancy. There is bulky right hilar and mild mediastinal adenopathy. 2. Right upper lobe perihilar ground-glass opacities are nonspecific, a be neoplastic, infectious or inflammatory. 3. Paramediastinal soft tissue densities in the lingula and right middle lobe, left upper and right lower lobe pulmonary nodules are also nonspecific. 4. Small pleural effusions. 5. No pulmonary embolus. These results will be called to the ordering clinician or representative by the Radiologist Assistant, and  communication documented in the PACS or zVision Dashboard. Electronically Signed   By: Jeb Levering M.D.   On: 05/04/2016 22:20    ASSESMENT:   *  FOBT +, progressive anemia.   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  07/2012 Colonoscopy: internal hemorrhoids only.  No prior EGD.    *  Anginal pattern chest tightness and dyspnea.  Cath today.      PLAN   *  Finalize plans regarding EGD/Colonoscopy once cath completed.     Steven Krause  05/06/2016, 10:24 AM Pager: [phone]

## 2016-05-06 NOTE — Progress Notes (Signed)
Pt's HR sustains at around 40 bpm while pt sleeps. Will continue to monitor.

## 2016-05-06 NOTE — Interval H&P Note (Signed)
History and Physical Interval Note:  05/06/2016 2:54 PM  Steven Krause  has presented today for surgery, with the diagnosis of Chest Pain   The various methods of treatment have been discussed with the patient and family. After consideration of risks, benefits and other options for treatment, the patient has consented to  Procedure(s): Left Heart Cath and Coronary Angiography (N/A) as a surgical intervention .  The patient's history has been reviewed, patient examined, no change in status, stable for surgery.  I have reviewed the patient's chart and labs.  Questions were answered to the patient's satisfaction.   Cath Lab Visit (complete for each Cath Lab visit)  Clinical Evaluation Leading to the Procedure:   ACS: Yes.    Non-ACS:    Anginal Classification: CCS IV  Anti-ischemic medical therapy: Minimal Therapy (1 class of medications)  Non-Invasive Test Results: No non-invasive testing performed  Prior CABG: No previous CABG        [name] 05/06/2016 2:54 PM

## 2016-05-06 NOTE — Care Management Note (Addendum)
Case Management Note  Patient Details  Name: Justinn Welter MRN: [id number] Date of Birth: 02-24-1941  Subjective/Objective:  Admitted with Chest Pain               Action/Plan: Patient lives at home alone, his neighbors takes him to his apt and other errands as needed due to his car is not working at this time; PCP: Hollace Kinnier, DO; has private insurance with [employer] with prescription drug coverage; pharmacy of choice is [facility]; He does not use any DME; he stated that he would get scales from his sister to weigh himself daily and he cooks a diet low in sodium. CM will continue to follow for DCP.  Expected Discharge Date:    possibly 05/08/2016              Expected Discharge Plan:  [place]  Discharge planning Services  CM Consult     Status of Service:  In process, will continue to follow  Royston Bake [place] ,MHA,BSN [phone] 05/06/2016, 1:32 PM

## 2016-05-06 NOTE — Progress Notes (Addendum)
RN took 3 cc of air out of band and pt started to bleed immediately.  Placed 3 cc of air back in band.  Bleeding stopped.  RN tried to call cath lab with no answer.  Waited 40 minutes and RN took 1 cc of air out of band.  Pt with no bleeding.  Will continue to take air out of band according to policy.

## 2016-05-06 NOTE — H&P (View-Only) (Signed)
Progress Note  Patient Name: Steven Krause Date of Encounter: 05/06/2016  Primary Cardiologist: Dr. Sallyanne Kuster  Subjective   Denies chest pain. Breathing is improved. Still has orthopnea. Still has edema to the knees. Pt had tele monitor off. Staff said he was refusing it. It talked to him and he agrees to wear it now.  Inpatient Medications    Scheduled Meds: . azithromycin  500 mg Intravenous Q24H  . cefTRIAXone (ROCEPHIN)  IV  1 g Intravenous Q24H  . ezetimibe  10 mg Oral Daily  . furosemide  40 mg Intravenous Q12H  . iron polysaccharides  150 mg Oral Daily  . potassium chloride SA  20 mEq Oral Daily  . sodium chloride flush  3 mL Intravenous Q12H  . sodium chloride flush  3 mL Intravenous Q12H  . tamsulosin  0.4 mg Oral Daily   Continuous Infusions: . sodium chloride     PRN Meds: sodium chloride, sodium chloride, acetaminophen, HYDROcodone-acetaminophen, ondansetron (ZOFRAN) IV, sodium chloride flush, sodium chloride flush   Vital Signs    Vitals:   05/05/16 1024 05/05/16 1333 05/05/16 2135 05/06/16 0502  BP: (!) 119/57 (!) 107/51 (!) 107/56 140/72  Pulse: (!) 52 63 96 (!) 52  Resp:  '18 18 18  '$ Temp: 98.1 F (36.7 C) 98 F (36.7 C) 98.1 F (36.7 C)   TempSrc: Oral Oral Oral Oral  SpO2: 95% 98% 92% 93%  Weight:    211 lb 9.6 oz (96 kg)  Height:        Intake/Output Summary (Last 24 hours) at 05/06/16 0849 Last data filed at 05/06/16 6063  Gross per 24 hour  Intake             1160 ml  Output              700 ml  Net              460 ml   Filed Weights   05/04/16 1835 05/05/16 0500 05/06/16 0502  Weight: 212 lb 8 oz (96.4 kg) 212 lb 4.8 oz (96.3 kg) 211 lb 9.6 oz (96 kg)    Telemetry    Atrial fib in the 40's and 50's until 2200 when pt removed monitor. Now convinced to leave monitor on. - Personally Reviewed  ECG    No new tracing  Physical Exam   GEN: No acute distress.   Neck: No JVD Cardiac: RRR, no murmurs, rubs, or gallops.    Respiratory: Clear to auscultation bilaterally. Diminished GI: Soft, nontender, large abdominal hernias present. Transverse scar noted. MS: 3+ pitting edema up to knees. No deformity. Neuro:  Nonfocal  Psych: Normal affect   Labs    Chemistry Recent Labs Lab 05/04/16 1454 05/05/16 0710 05/06/16 0452  NA 133* 131* 131*  K 3.5 3.6 4.0  CL 93* 91* 93*  CO2 30 33* 29  GLUCOSE 115* 105* 106*  BUN 15 17 22*  CREATININE 1.16 1.09 1.14  CALCIUM 8.9 8.4* 8.9  GFRNONAA 60* >60 >60  GFRAA >60 >60 >60  ANIONGAP '10 7 9     '$ Hematology Recent Labs Lab 05/04/16 1454 05/04/16 1900 05/05/16 1009 05/06/16 0452  WBC 7.4  --  8.8 8.9  RBC 3.62* 3.59* 3.46* 3.44*  HGB 9.2*  --  8.8* 8.6*  HCT 29.8*  --  28.4* 28.0*  MCV 82.3  --  82.1 81.4  MCH 25.4*  --  25.4* 25.0*  MCHC 30.9  --  31.0 30.7  RDW  15.1  --  14.9 15.1  PLT 449*  --  521* 466*    Cardiac Enzymes Recent Labs Lab 05/04/16 1900 05/05/16 0114 05/05/16 0710  TROPONINI <0.03 <0.03 <0.03    Recent Labs Lab 05/04/16 1520  TROPIPOC 0.02     BNP Recent Labs Lab 05/04/16 1454 05/06/16 0452  BNP 216.0* 251.1*     DDimer  Recent Labs Lab 05/04/16 1454  DDIMER 1.10*     Radiology    Dg Chest 2 View  Result Date: 05/04/2016 CLINICAL DATA:  76 year old male with weakness, dizziness, shortness of breath. Initial encounter. EXAM: CHEST  2 VIEW COMPARISON:  12/16/2013 and earlier. FINDINGS: Mild cardiomegaly is new since 2015. Other mediastinal contours are within normal limits. Visualized tracheal air column is within normal limits. No pneumothorax or pulmonary edema. There is a small right pleural effusion with patchy mostly middle lobe right lung opacity. There is also subtle new right perihilar patchy opacity (arrows). Mild linear opacity at the left lung base most resembles atelectasis. Chronic abdominal surgical clips. Negative visible bowel gas pattern. No acute osseous abnormality identified. IMPRESSION:  1. Small right pleural effusion with patchy multifocal right lung opacity which is nonspecific but suspicious for acute bronchopneumonia. 2. Mild cardiomegaly since 2015. Electronically Signed   By: Genevie Ann M.D.   On: 05/04/2016 14:44   Ct Angio Chest Pe W Or Wo Contrast  Result Date: 05/04/2016 CLINICAL DATA:  Worsening chest pain for 1 week. EXAM: CT ANGIOGRAPHY CHEST WITH CONTRAST TECHNIQUE: Multidetector CT imaging of the chest was performed using the standard protocol during bolus administration of intravenous contrast. Multiplanar CT image reconstructions and MIPs were obtained to evaluate the vascular anatomy. CONTRAST:  100 cc Isovue 370 IV COMPARISON:  Chest radiograph earlier this day FINDINGS: Cardiovascular: There are no filling defects within the pulmonary arteries to suggest pulmonary embolus. Mild multi chamber cardiomegaly. There is atherosclerosis of the thoracic aorta without aneurysm. Mediastinum/Nodes: Bulky right hilar adenopathy, node measures 3.4 cm. Enlarged lower paratracheal node suspected measuring 13 mm short axis, however motion artifact is present. There is a prominent sub- carinal node measuring 10 mm. No left hilar adenopathy. No definite supraclavicular adenopathy. Peripherally calcified nodule in the right lobe of the thyroid gland measures 15 mm. The esophagus is decompressed. Lungs/Pleura: Central right upper lobe pulmonary nodule measures 2.3 x 1.7 cm with ill-defined margins. There is perihilar ill-defined ground-glass opacity in the upper lobe, however breathing motion in this region, difficult to exclude involvement of superior segment of the right lower lobe. This ground-glass opacity appears multifocal but centered around the central airways. There are a few nodular opacities in the right lower lobe for example 6 x 8 mm nodule image 86 series 407. Small right lower lobe nodules more inferiorly. Ill-defined density in the anterior most right lower lobe about the  costophrenic sulcus. There is a 2.1 x 2.0 cm soft tissue density abutting the mediastinum in the right middle lobe image 102 series 407. Within the lingula there is nonspecific 1.7 x 2.3 cm nodularity abutting the pleura. Ill-defined subpleural nodularity in the anterior left upper lobe image 72 series 407. There small bilateral pleural effusions. Upper Abdomen: No evidence of adrenal nodule. No definite acute abnormality. Laxity of the anterior abdominal wall with probable ventral abdominal wall hernia is only partially included. Musculoskeletal: There are no acute or suspicious osseous abnormalities. No blastic or destructive lytic lesions. Review of the MIP images confirms the above findings. IMPRESSION: 1. Irregular 2.3 cm  right upper lobe pulmonary nodule suspicious for primary bronchogenic malignancy. There is bulky right hilar and mild mediastinal adenopathy. 2. Right upper lobe perihilar ground-glass opacities are nonspecific, a be neoplastic, infectious or inflammatory. 3. Paramediastinal soft tissue densities in the lingula and right middle lobe, left upper and right lower lobe pulmonary nodules are also nonspecific. 4. Small pleural effusions. 5. No pulmonary embolus. These results will be called to the ordering clinician or representative by the Radiologist Assistant, and communication documented in the PACS or zVision Dashboard. Electronically Signed   By: Jeb Levering M.D.   On: 05/04/2016 22:20    Cardiac Studies   ECHO 05/07/2015 Study Conclusions  - Left ventricle: The cavity size was normal. Systolic function was   normal. The estimated ejection fraction was in the range of 60%   to 65%. Wall motion was normal; there were no regional wall   motion abnormalities. Features are consistent with a pseudonormal   left ventricular filling pattern, with concomitant abnormal   relaxation and increased filling pressure (grade 2 diastolic   dysfunction). - Aortic valve: Trileaflet; mildly  thickened, mildly calcified   leaflets. Valve mobility was restricted. There was very mild   stenosis. Peak velocity (S): 248 cm/s. Mean gradient (S): 10 mm   Hg. - Left atrium: The atrium was mildly dilated. - Right atrium: The atrium was mildly dilated. - Pulmonary arteries: Systolic pressure was moderately increased.   PA peak pressure: 51 mm Hg (S).  Patient Profile     76 y.o. male with a PMH significant for rate controlled Afib on xarelto with frequent PVCs, smoker, HTN, anemia, and CHF. He presented to the ED with exertional chest discomfort and dyspnea. Cardiology was consulted for chest pain and possible CHF.   Assessment & Plan    1. Chest pain - EKG without signs of ischemia; troponin x 3 negative - pt has history of HTN, HLD, and smoking and strong family history of heart disease - pt has had chest pain with exertion that is relieved with rest x 3 days. None overnight.  -Echo shows normal LV EF, Grade 2 diastolic dysfunction, no regional WMA -Planned for cardaic cath today for definitive cardiac evaluation in setting of significant change in symptomatology with exertional shortness of breath and significant exertional chest pressure, worrisome for ischemia. His Xarelto is on hold and plan for cath this afternoon after he has been without for 48h. Last dose of Xarelto 2/28 pm - he was not heparinized secondary to anemia on admission  2. Acute diastolic CHF, pleural effusion, LE edema - echocardiogram shows EF 60-65%, Grade 2 DD - BNP 219 on admission. Today BNP 251. - pt presented with pleural effusions and LE edema; however, given his low BNP this volume may be related to pulmonary nodules concerning for bronchogenic malignancy vs PNA - diuresing per primary team with lasix 40 mg IV BID initially diuresed 1.4L, net negative 395cc; weight stable from admission. Has had modest diuresis. Can pursue more aggressively after cardiac cath. - sCr 1.09-->1.14; baseline sCr  0.88-1.03 - K stable (4.0) - albumin is low (3.5), likely contributing to his edema   3. Permanent atrial fibrillation, rate controlled - pt denies dizziness, lightheadedness, and palpitations - last dose of xarelto was 05/04/16 evening - not currently on a beta blocker at home 2/2 bradycardia; ventricular rates in the 60s   3. HTN - home meds: norvasc, spironolactone, lasix - enalapril 2.5 mg started; hold today for cath   4. Positive  D-Dimer - CTA ruled out PE  - given his LE swelling, recommend LE duplex to r/o DVT   5. Anemia - positive FOBT - low iron and ferritin; normal-high TIBC - Hb 8.8 today from 9.2 on admission (previously 10.2 on 04/18/16; 12.9 on 12/25/15) - continue to hold anticoagulation   6. PNA - found on CTA this admission - started on zithromax and rocephin per primary team  Signed, Daune Perch, NP  05/06/2016, 8:49 AM   Pager: [phone] Steven Krause is a 76 y.o. male  Patient seen and examined. Agree with assessment and plan. Feels better. Pt has consented to undo cardiac cath this afternoon.  He has had a definitive change in symptomology which may be contributed by his anemia. Now on Fe replacement with Fe sat 2% and low ferritin 13; will need GI evaluation. Continues to have 2-3 + LE edema; will not additionally diurese this am with plans for cath later today.  The risks and benefits of a cardiac catheterization including, but not limited to, death, stroke, MI, kidney damage and bleeding were discussed with the patient who indicates understanding and agrees to proceed.    Troy Sine, MD, [facility] 05/06/2016 11:09 AM

## 2016-05-06 NOTE — Progress Notes (Signed)
[facility]                                                                              Patient Demographics  Steven Krause, is a 76 y.o. male, DOB - 25-Feb-1941, OZH:[id number]  Admit date - 05/04/2016   Admitting Physician Ripudeep Krystal Eaton, MD  Outpatient Primary MD for the patient is REED, [name], DO  Outpatient specialists:   LOS - 2  days    Chief Complaint  Patient presents with  . Shortness of Breath       Brief summary   Patient is a 76 year old male with history of polycythemia, abdominal hernia, hypertension, hypertension presented with shortness of breath and chest tightness since this morning and worsening peripheral edema in the last 2 weeks. History was obtained from the patient. The patient reported that he was in his baseline health this morning when he woke up. He was eating breakfast when he choked up on the eggs around 8 AM and since then he felt that he was not able to catch his breath. Patient also felt pressure-like chest tightness with no radiation. He does have abdominal distention but he attributes it to his abdominal hernia, no abdominal pain. He denies any orthopnea or PND however when he sat up for his examination, he stated he felt better. Patient denied any hematochezia or melena to me however to EDP, he reported intermittent block stools. hemoglobin 9.2. Hemoglobin was 10.2 on 2/12 and 12.9 on 12/25/15, gradually has been trending down.  FOBT positive   Assessment & Plan    Principal Problem:   Acute CHF (congestive heart failure) ([place]): Unknown EF - Patient presenting with exertional dyspnea, chest tightness and worsening peripheral edema in the last 2 weeks, elevated BNP. Previous echo in 2011 - Chest x-ray shows small right pleural effusion with patchy multifocal right lung opacity, nonspecific but suspicious for acute bronchopneumonia - Placed on strict I's and O's and daily weights, on IV diuresis with Lasix 40 mg q12hrs  -  Troponins negative, d-dimer elevated, CT angiogram of the chest negative for PE however showed irregular 2.3 cm right upper lobe pulmonary nodule suspicious for primary bronchogenic malignancy with bulky right hilar and mediastinal adenopathy.  -  Doppler ultrasound of the lower extremities negative for DVT - Cardiology plans for cardiac cath today  Active Problems: 2.3 cm right upper lobe pulmonary nodule/tumor - CT angiogram of the chest was negative for PE however showed irregular 2.3 cm right upper lobe, the nodule suspicious for primary bronchogenic malignancy with bulky right hilar and mild mediastinal adenopathy - Explained in detail to the patient, discussed with pulmonology, Dr Rogene Houston,  will likely need outpatient PET CT and biopsy, recommended to make follow-up appointment outpatient - follow-up scheduled with Dr. Melvyn Novas on 05/12/16 at 3:30 PM    Chronic atrial fibrillation ([place]) - Ventricular response slow in 60s, hence no beta blockers  - Patient is on xarelto, currently on hold secondary to anemia and positive FOBT    Essential hypertension - Currently stable, will place on low-dose  ACEI, Lasix  - Hold Norvasc due to acute CHF, no beta blocker secondary to  bradycardia     Chronic Anemia - Unclear etiology, FOBT positive, hemoglobin trending down to 8.8 today - Anemia panel shows iron deficiency anemia - Colonoscopy in 07/2012 had shown internal hemorrhoids otherwise negative - will hold xarelto, GI consulted, plan for EGD, colonoscopy after the cardiac cath     CAP (community acquired pneumonia): Possibly due to aspiration due to choking on the breakfast  - Chest x-ray shows small right pleural effusion with patchy multifocal right lung opacity, nonspecific but suspicious for acute bronchopneumonia - for now place on Zithromax, Rocephin, -Urine strep antigen negative, pro-calcitonin <0.1   Code Status: full DVT Prophylaxis:  SCD's Family Communication: Discussed in detail  with the patient, all imaging results, lab results explained to the patient  Disposition Plan:   Time Spent in minutes   25 minutes  Procedures:  CTA chest  Consultants:   Cardiology GI Pulmonology  Antimicrobials:   IV Zithromax  IV Rocephin   Medications  Scheduled Meds: . azithromycin  500 mg Intravenous Q24H  . cefTRIAXone (ROCEPHIN)  IV  1 g Intravenous Q24H  . ezetimibe  10 mg Oral Daily  . furosemide  40 mg Intravenous Q12H  . iron polysaccharides  150 mg Oral Daily  . potassium chloride SA  20 mEq Oral Daily  . sodium chloride flush  3 mL Intravenous Q12H  . sodium chloride flush  3 mL Intravenous Q12H  . tamsulosin  0.4 mg Oral Daily   Continuous Infusions: . sodium chloride     PRN Meds:.sodium chloride, sodium chloride, acetaminophen, HYDROcodone-acetaminophen, ondansetron (ZOFRAN) IV, sodium chloride flush, sodium chloride flush   Antibiotics   Anti-infectives    Start     Dose/Rate Route Frequency Ordered Stop   05/05/16 1600  azithromycin (ZITHROMAX) 500 mg in dextrose 5 % 250 mL IVPB     500 mg 250 mL/hr over 60 Minutes Intravenous Every 24 hours 05/04/16 1854     05/05/16 1600  cefTRIAXone (ROCEPHIN) 1 g in dextrose 5 % 50 mL IVPB     1 g 100 mL/hr over 30 Minutes Intravenous Every 24 hours 05/04/16 1854     05/04/16 1545  cefTRIAXone (ROCEPHIN) injection 1 g     1 g Intramuscular  Once 05/04/16 1535 05/04/16 1550   05/04/16 1545  azithromycin (ZITHROMAX) 500 mg in dextrose 5 % 250 mL IVPB     500 mg 250 mL/hr over 60 Minutes Intravenous  Once 05/04/16 1535 05/04/16 1730        Subjective:   Steven Krause was seen and examined today. Still significant peripheral edema however shortness of breath is now better, ambulating without any difficulty. No fevers or chills. Patient denies dizziness, abdominal pain, N/V/D/C, new weakness, numbess, tingling. No acute events overnight.    Objective:   Vitals:   05/05/16 1024 05/05/16 1333  05/05/16 2135 05/06/16 0502  BP: (!) 119/57 (!) 107/51 (!) 107/56 140/72  Pulse: (!) 52 63 96 (!) 52  Resp:  '18 18 18  '$ Temp: 98.1 F (36.7 C) 98 F (36.7 C) 98.1 F (36.7 C)   TempSrc: Oral Oral Oral Oral  SpO2: 95% 98% 92% 93%  Weight:    96 kg (211 lb 9.6 oz)  Height:        Intake/Output Summary (Last 24 hours) at 05/06/16 1246 Last data filed at 05/06/16 0919  Gross per 24 hour  Intake             1520 ml  Output  600 ml  Net              920 ml     Wt Readings from Last 3 Encounters:  05/06/16 96 kg (211 lb 9.6 oz)  04/18/16 93.9 kg (207 lb)  12/25/15 88.9 kg (196 lb)     Exam  General: Alert and oriented x 3, NAD  HEENT:    Neck: Supple, no JVD, no masses  Cardiovascular: S1 S2 Clear, RRR  Respiratory: Clear to auscultation bilaterally, no wheezing, rales or rhonchi  Gastrointestinal: Soft, nontender, nondistended, + bowel sounds  Ext: no cyanosis clubbing, 2+ edema  Neuro: no new deficits  Skin: No rashes  Psych: Normal affect and demeanor, alert and oriented x3    Data Reviewed:  I have personally reviewed following labs and imaging studies  Micro Results No results found for this or any previous visit (from the past 240 hour(s)).  Radiology Reports Dg Chest 2 View  Result Date: 05/04/2016 CLINICAL DATA:  76 year old male with weakness, dizziness, shortness of breath. Initial encounter. EXAM: CHEST  2 VIEW COMPARISON:  12/16/2013 and earlier. FINDINGS: Mild cardiomegaly is new since 2015. Other mediastinal contours are within normal limits. Visualized tracheal air column is within normal limits. No pneumothorax or pulmonary edema. There is a small right pleural effusion with patchy mostly middle lobe right lung opacity. There is also subtle new right perihilar patchy opacity (arrows). Mild linear opacity at the left lung base most resembles atelectasis. Chronic abdominal surgical clips. Negative visible bowel gas pattern. No acute  osseous abnormality identified. IMPRESSION: 1. Small right pleural effusion with patchy multifocal right lung opacity which is nonspecific but suspicious for acute bronchopneumonia. 2. Mild cardiomegaly since 2015. Electronically Signed   By: Genevie Ann M.D.   On: 05/04/2016 14:44   Ct Angio Chest Pe W Or Wo Contrast  Result Date: 05/04/2016 CLINICAL DATA:  Worsening chest pain for 1 week. EXAM: CT ANGIOGRAPHY CHEST WITH CONTRAST TECHNIQUE: Multidetector CT imaging of the chest was performed using the standard protocol during bolus administration of intravenous contrast. Multiplanar CT image reconstructions and MIPs were obtained to evaluate the vascular anatomy. CONTRAST:  100 cc Isovue 370 IV COMPARISON:  Chest radiograph earlier this day FINDINGS: Cardiovascular: There are no filling defects within the pulmonary arteries to suggest pulmonary embolus. Mild multi chamber cardiomegaly. There is atherosclerosis of the thoracic aorta without aneurysm. Mediastinum/Nodes: Bulky right hilar adenopathy, node measures 3.4 cm. Enlarged lower paratracheal node suspected measuring 13 mm short axis, however motion artifact is present. There is a prominent sub- carinal node measuring 10 mm. No left hilar adenopathy. No definite supraclavicular adenopathy. Peripherally calcified nodule in the right lobe of the thyroid gland measures 15 mm. The esophagus is decompressed. Lungs/Pleura: Central right upper lobe pulmonary nodule measures 2.3 x 1.7 cm with ill-defined margins. There is perihilar ill-defined ground-glass opacity in the upper lobe, however breathing motion in this region, difficult to exclude involvement of superior segment of the right lower lobe. This ground-glass opacity appears multifocal but centered around the central airways. There are a few nodular opacities in the right lower lobe for example 6 x 8 mm nodule image 86 series 407. Small right lower lobe nodules more inferiorly. Ill-defined density in the  anterior most right lower lobe about the costophrenic sulcus. There is a 2.1 x 2.0 cm soft tissue density abutting the mediastinum in the right middle lobe image 102 series 407. Within the lingula there is nonspecific 1.7 x 2.3 cm nodularity  abutting the pleura. Ill-defined subpleural nodularity in the anterior left upper lobe image 72 series 407. There small bilateral pleural effusions. Upper Abdomen: No evidence of adrenal nodule. No definite acute abnormality. Laxity of the anterior abdominal wall with probable ventral abdominal wall hernia is only partially included. Musculoskeletal: There are no acute or suspicious osseous abnormalities. No blastic or destructive lytic lesions. Review of the MIP images confirms the above findings. IMPRESSION: 1. Irregular 2.3 cm right upper lobe pulmonary nodule suspicious for primary bronchogenic malignancy. There is bulky right hilar and mild mediastinal adenopathy. 2. Right upper lobe perihilar ground-glass opacities are nonspecific, a be neoplastic, infectious or inflammatory. 3. Paramediastinal soft tissue densities in the lingula and right middle lobe, left upper and right lower lobe pulmonary nodules are also nonspecific. 4. Small pleural effusions. 5. No pulmonary embolus. These results will be called to the ordering clinician or representative by the Radiologist Assistant, and communication documented in the PACS or zVision Dashboard. Electronically Signed   By: Jeb Levering M.D.   On: 05/04/2016 22:20    Lab Data:  CBC:  Recent Labs Lab 05/04/16 1454 05/04/16 1731 05/05/16 1009 05/06/16 0452  WBC 7.4  --  8.8 8.9  NEUTROABS  --  6.5  --   --   HGB 9.2*  --  8.8* 8.6*  HCT 29.8*  --  28.4* 28.0*  MCV 82.3  --  82.1 81.4  PLT 449*  --  521* 941*   Basic Metabolic Panel:  Recent Labs Lab 05/04/16 1454 05/05/16 0710 05/06/16 0452  NA 133* 131* 131*  K 3.5 3.6 4.0  CL 93* 91* 93*  CO2 30 33* 29  GLUCOSE 115* 105* 106*  BUN 15 17 22*    CREATININE 1.16 1.09 1.14  CALCIUM 8.9 8.4* 8.9   GFR: Estimated Creatinine Clearance: 64 mL/min (by C-G formula based on SCr of 1.14 mg/dL). Liver Function Tests: No results for input(s): AST, ALT, ALKPHOS, BILITOT, PROT, ALBUMIN in the last 168 hours. No results for input(s): LIPASE, AMYLASE in the last 168 hours. No results for input(s): AMMONIA in the last 168 hours. Coagulation Profile:  Recent Labs Lab 05/06/16 0452  INR 1.21   Cardiac Enzymes:  Recent Labs Lab 05/04/16 1900 05/05/16 0114 05/05/16 0710  TROPONINI <0.03 <0.03 <0.03   BNP (last 3 results) No results for input(s): PROBNP in the last 8760 hours. HbA1C: No results for input(s): HGBA1C in the last 72 hours. CBG: No results for input(s): GLUCAP in the last 168 hours. Lipid Profile: No results for input(s): CHOL, HDL, LDLCALC, TRIG, CHOLHDL, LDLDIRECT in the last 72 hours. Thyroid Function Tests: No results for input(s): TSH, T4TOTAL, FREET4, T3FREE, THYROIDAB in the last 72 hours. Anemia Panel:  Recent Labs  05/04/16 1900  VITAMINB12 262  FOLATE 10.4  FERRITIN 13*  TIBC 497*  IRON 11*  RETICCTPCT 2.5   Urine analysis:    Component Value Date/Time   BILIRUBINUR Neg 12/16/2013 1328   PROTEINUR Trace 12/16/2013 1328   UROBILINOGEN 0.2 12/16/2013 1328   NITRITE Neg 12/16/2013 1328   LEUKOCYTESUR Negative 12/16/2013 1328     RAI,RIPUDEEP M.D. [facility] 05/06/2016, 12:46 PM  Pager: [phone] Between 7am to 7pm - call Pager - [phone]  After 7pm go to [email or web address] - password [email or web address]  Call night coverage person covering after 7pm

## 2016-05-06 NOTE — Progress Notes (Signed)
Pt back from cath lab with TR band in place.  3 cc of air taken out of band, immediately started bleeding.  3 cc of air placed back in band and pt still bleeding.  RN put another 3 cc of air in band and instructed pt not to move his arm.  Bleeding stopped, will continue to monitor.

## 2016-05-06 NOTE — Progress Notes (Addendum)
Pt now refusing cardiac cath and cardiac monitoring. Pt states he cant get any sleep in the hospital and wants to go home. Pt now in his clothes from home. Cath nurse came to speak with the patient, and said she will send someone else to talk with him. Clear liquid breakfast order received. Will continue to monitor.

## 2016-05-07 DIAGNOSIS — I481 Persistent atrial fibrillation: Secondary | ICD-10-CM

## 2016-05-07 DIAGNOSIS — I4819 Other persistent atrial fibrillation: Secondary | ICD-10-CM

## 2016-05-07 LAB — BASIC METABOLIC PANEL
Anion gap: 9 (ref 5–15)
BUN: 18 mg/dL (ref 6–20)
CHLORIDE: 94 mmol/L — AB (ref 101–111)
CO2: 30 mmol/L (ref 22–32)
CREATININE: 1.12 mg/dL (ref 0.61–1.24)
Calcium: 8.6 mg/dL — ABNORMAL LOW (ref 8.9–10.3)
GFR calc Af Amer: >60 mL/min (ref >60)
GFR calc non Af Amer: >60 mL/min (ref >60)
GLUCOSE: 95 mg/dL (ref 65–99)
Potassium: 3.9 mmol/L (ref 3.5–5.1)
SODIUM: 133 mmol/L — AB (ref 135–145)

## 2016-05-07 LAB — CBC
HEMATOCRIT: 26.7 % — AB (ref 39.0–52.0)
Hemoglobin: 8 g/dL — ABNORMAL LOW (ref 13.0–17.0)
MCH: 24.7 pg — ABNORMAL LOW (ref 26.0–34.0)
MCHC: 30 g/dL (ref 30.0–36.0)
MCV: 82.4 fL (ref 78.0–100.0)
PLATELETS: 433 10*3/uL — AB (ref 150–400)
RBC: 3.24 MIL/uL — ABNORMAL LOW (ref 4.22–5.81)
RDW: 15.2 % (ref 11.5–15.5)
WBC: 7.8 10*3/uL (ref 4.0–10.5)

## 2016-05-07 NOTE — Progress Notes (Signed)
Progress Note  Patient Name: Steven Krause Date of Encounter: 05/07/2016  Primary Cardiologist: Dr. Sallyanne Kuster  Subjective   76 yo admitted with CP,  Hx of atrial fib Moderate pulmonary HTN Has chronic leg edema   Had cath History which revealed very ectatic coronary arteries.    There were no significant stenosis but all of the vessels were very ectatic and was sluggish flow.  Denies chest pain. Breathing is improved. Still has orthopnea. Still has edema to the knees. Pt had tele monitor off. Staff said he was refusing it. It talked to him and he agrees to wear it now.  Inpatient Medications    Scheduled Meds: . azithromycin  500 mg Intravenous Q24H  . cefTRIAXone (ROCEPHIN)  IV  1 g Intravenous Q24H  . ezetimibe  10 mg Oral Daily  . furosemide  40 mg Intravenous Q12H  . iron polysaccharides  150 mg Oral Daily  . potassium chloride SA  20 mEq Oral Daily  . sodium chloride flush  3 mL Intravenous Q12H  . sodium chloride flush  3 mL Intravenous Q12H  . tamsulosin  0.4 mg Oral Daily   Continuous Infusions:  PRN Meds: sodium chloride, sodium chloride, acetaminophen, HYDROcodone-acetaminophen, ondansetron (ZOFRAN) IV, sodium chloride flush, sodium chloride flush   Vital Signs    Vitals:   05/06/16 1524 05/06/16 2059 05/07/16 0516 05/07/16 1002  BP:  (!) 141/70 (!) 134/55 129/85  Pulse: (!) 0 67 (!) 54 (!) 51  Resp: (!) 0 18 18   Temp:  97.8 F (36.6 C) 97.8 F (36.6 C)   TempSrc:  Oral Oral   SpO2: (!) 0% 94% 92% 95%  Weight:   206 lb 9.6 oz (93.7 kg)   Height:        Intake/Output Summary (Last 24 hours) at 05/07/16 1312 Last data filed at 05/07/16 0817  Gross per 24 hour  Intake              120 ml  Output             1825 ml  Net            -1705 ml   Filed Weights   05/05/16 0500 05/06/16 0502 05/07/16 0516  Weight: 212 lb 4.8 oz (96.3 kg) 211 lb 9.6 oz (96 kg) 206 lb 9.6 oz (93.7 kg)    Telemetry    Atrial fib in the 40's and 50's until 2200 when  pt removed monitor. Now convinced to leave monitor on. - Personally Reviewed  ECG    No new tracing  Physical Exam   GEN: No acute distress.   Neck: No JVD Cardiac: Irreg, no murmurs, rubs, or gallops.  Respiratory: Clear to auscultation bilaterally. Diminished GI: Soft, nontender, large abdominal hernias present. Transverse scar noted. MS: 2-3+ pitting edema up to knees. No deformity. Neuro:  Nonfocal  Psych: Normal affect   Labs    Chemistry  Recent Labs Lab 05/05/16 0710 05/06/16 0452 05/07/16 0430  NA 131* 131* 133*  K 3.6 4.0 3.9  CL 91* 93* 94*  CO2 33* 29 30  GLUCOSE 105* 106* 95  BUN 17 22* 18  CREATININE 1.09 1.14 1.12  CALCIUM 8.4* 8.9 8.6*  GFRNONAA >60 >60 >60  GFRAA >60 >60 >60  ANIONGAP '7 9 9     '$ Hematology  Recent Labs Lab 05/05/16 1009 05/06/16 0452 05/07/16 0430  WBC 8.8 8.9 7.8  RBC 3.46* 3.44* 3.24*  HGB 8.8* 8.6* 8.0*  HCT  28.4* 28.0* 26.7*  MCV 82.1 81.4 82.4  MCH 25.4* 25.0* 24.7*  MCHC 31.0 30.7 30.0  RDW 14.9 15.1 15.2  PLT 521* 466* 433*    Cardiac Enzymes  Recent Labs Lab 05/04/16 1900 05/05/16 0114 05/05/16 0710  TROPONINI <0.03 <0.03 <0.03     Recent Labs Lab 05/04/16 1520  TROPIPOC 0.02     BNP  Recent Labs Lab 05/04/16 1454 05/06/16 0452  BNP 216.0* 251.1*     DDimer   Recent Labs Lab 05/04/16 1454  DDIMER 1.10*     Radiology    No results found.  Cardiac Studies   ECHO 05/07/2015 Study Conclusions  - Left ventricle: The cavity size was normal. Systolic function was   normal. The estimated ejection fraction was in the range of 60%   to 65%. Wall motion was normal; there were no regional wall   motion abnormalities. Features are consistent with a pseudonormal   left ventricular filling pattern, with concomitant abnormal   relaxation and increased filling pressure (grade 2 diastolic   dysfunction). - Aortic valve: Trileaflet; mildly thickened, mildly calcified   leaflets. Valve  mobility was restricted. There was very mild   stenosis. Peak velocity (S): 248 cm/s. Mean gradient (S): 10 mm   Hg. - Left atrium: The atrium was mildly dilated. - Right atrium: The atrium was mildly dilated. - Pulmonary arteries: Systolic pressure was moderately increased.   PA peak pressure: 51 mm Hg (S).  Patient Profile     76 y.o. male with a PMH significant for rate controlled Afib on xarelto with frequent PVCs, smoker, HTN, anemia, and CHF. He presented to the ED with exertional chest discomfort and dyspnea. Cardiology was consulted for chest pain and possible CHF.   Assessment & Plan    1. Chest pain - EKG without signs of ischemia; troponin x 3 negative - pt has history of HTN, HLD, and smoking and strong family history of heart disease - pt has had chest pain with exertion that is relieved with rest x 3 days. None overnight.  -Echo shows normal LV EF, Grade 2 diastolic dysfunction, no regional WMA  cath Shows ectatic coronary arteries.  2. Acute diastolic CHF, pleural effusion, LE edema - echocardiogram shows EF 60-65%, Grade 2 DD - BNP 219 on admission. Today BNP 251. - pt presented with pleural effusions and LE edema; however, given his low BNP this volume may be related to pulmonary nodules concerning for bronchogenic malignancy vs PNA - diuresing per primary team with lasix 40 mg IV BID initially diuresed 1.4L, net negative 395cc; weight stable from admission. Has had modest diuresis. Can pursue more aggressively after cardiac cath. - sCr 1.09-->1.14; baseline sCr 0.88-1.03 - K stable (4.0) - albumin is low (3.5), likely contributing to his edema   3. Permanent atrial fibrillation, rate controlled - pt denies dizziness, lightheadedness, and palpitations - last dose of xarelto was 05/04/16 evening - not currently on a beta blocker at home 2/2 bradycardia; ventricular rates in the 60s   3. HTN - home meds: norvasc, spironolactone, lasix   4. Positive  D-Dimer - CTA ruled out PE  Venous 2+ scan reveals no evidence of DVT. Ive recommended leg elevation   5. Anemia - positive FOBT - low iron and ferritin; normal-high TIBC - Hb 8.8 today from 9.2 on admission (previously 10.2 on 04/18/16; 12.9 on 12/25/15) - continue to hold anticoagulation  He's on chronic anticoagulation for his atrial fibrillation. He thinks that the positive Hemoccult  study was because of hemorrhoids. We should consider restarting long-term anticoagulation.     Mertie Moores, MD  05/07/2016 1:27 PM    [facility] [place],  [place], [place]  [id number] Pager [phone] Phone: [phone]; Fax: [phone]

## 2016-05-07 NOTE — Progress Notes (Signed)
[facility]                                                                              Patient Demographics  Steven Krause, is a 76 y.o. male, DOB - 1941/01/10, KGU:[id number]  Admit date - 05/04/2016   Admitting Physician Evadean Sproule Krystal Eaton, MD  Outpatient Primary MD for the patient is REED, [name], DO  Outpatient specialists:   LOS - 3  days    Chief Complaint  Patient presents with  . Shortness of Breath       Brief summary   Patient is a 76 year old male with history of polycythemia, abdominal hernia, hypertension, hypertension presented with shortness of breath and chest tightness since this morning and worsening peripheral edema in the last 2 weeks. History was obtained from the patient. The patient reported that he was in his baseline health this morning when he woke up. He was eating breakfast when he choked up on the eggs around 8 AM and since then he felt that he was not able to catch his breath. Patient also felt pressure-like chest tightness with no radiation. He does have abdominal distention but he attributes it to his abdominal hernia, no abdominal pain. He denies any orthopnea or PND however when he sat up for his examination, he stated he felt better. Patient denied any hematochezia or melena to me however to EDP, he reported intermittent block stools. hemoglobin 9.2. Hemoglobin was 10.2 on 2/12 and 12.9 on 12/25/15, gradually has been trending down.  FOBT positive   Assessment & Plan    Principal Problem:   Acute CHF (congestive heart failure) ([place]): Unknown EF - Patient presenting with exertional dyspnea, chest tightness and worsening peripheral edema in the last 2 weeks, elevated BNP. Previous echo in 2011 - Chest x-ray shows small right pleural effusion with patchy multifocal right lung opacity, nonspecific but suspicious for acute bronchopneumonia - Placed on strict I's and O's and daily weights, on IV diuresis with Lasix 40 mg q12hrs  -  Troponins negative, d-dimer elevated, CT angiogram of the chest negative for PE however showed irregular 2.3 cm right upper lobe pulmonary nodule suspicious for primary bronchogenic malignancy with bulky right hilar and mediastinal adenopathy.  -  Doppler ultrasound of the lower extremities negative for DVT - Cardiac cath showed marked aneurysmal CAD, RCA large and aneurysmal, only obstructive disease is a tiny distal RCA branch plan medical management  Active Problems: 2.3 cm right upper lobe pulmonary nodule/tumor - CT angiogram of the chest was negative for PE however showed irregular 2.3 cm right upper lobe, the nodule suspicious for primary bronchogenic malignancy with bulky right hilar and mild mediastinal adenopathy - Explained in detail to the patient, discussed with pulmonology, Dr Rogene Houston,  will likely need outpatient PET CT and biopsy, recommended to make follow-up appointment outpatient - follow-up scheduled with Dr. Melvyn Novas on 05/12/16 at 3:30 PM    Chronic atrial fibrillation ([place]) - Ventricular response slow in 60s, hence no beta blockers  - Patient is on xarelto, currently on hold secondary to anemia and positive FOBT    Essential hypertension - Currently stable, will place on low-dose  ACEI, Lasix  - Hold Norvasc due to acute CHF, no beta blocker secondary to bradycardia     Chronic Anemia - Unclear etiology, FOBT positive, hemoglobin trending down to 8.8 today - Anemia panel shows iron deficiency anemia - Colonoscopy in 07/2012 had shown internal hemorrhoids otherwise negative - will hold xarelto, GI consulted, plan for EGD, colonoscopy after the cardiac cath     CAP (community acquired pneumonia): Possibly due to aspiration due to choking on the breakfast  - Chest x-ray shows small right pleural effusion with patchy multifocal right lung opacity, nonspecific but suspicious for acute bronchopneumonia - for now place on Zithromax, Rocephin, -Urine strep antigen negative,  pro-calcitonin <0.1   Code Status: full DVT Prophylaxis:  SCD's Family Communication: Discussed in detail with the patient, all imaging results, lab results explained to the patient  Disposition Plan: Awaiting GI evaluation  Time Spent in minutes   15 minutes  Procedures:  CTA chest Cardiac  Cath   Consultants:   Cardiology GI Pulmonology  Antimicrobials:   IV Zithromax  IV Rocephin   Medications  Scheduled Meds: . azithromycin  500 mg Intravenous Q24H  . cefTRIAXone (ROCEPHIN)  IV  1 g Intravenous Q24H  . ezetimibe  10 mg Oral Daily  . furosemide  40 mg Intravenous Q12H  . iron polysaccharides  150 mg Oral Daily  . potassium chloride SA  20 mEq Oral Daily  . sodium chloride flush  3 mL Intravenous Q12H  . sodium chloride flush  3 mL Intravenous Q12H  . tamsulosin  0.4 mg Oral Daily   Continuous Infusions:  PRN Meds:.sodium chloride, sodium chloride, acetaminophen, HYDROcodone-acetaminophen, ondansetron (ZOFRAN) IV, sodium chloride flush, sodium chloride flush   Antibiotics   Anti-infectives    Start     Dose/Rate Route Frequency Ordered Stop   05/05/16 1600  azithromycin (ZITHROMAX) 500 mg in dextrose 5 % 250 mL IVPB     500 mg 250 mL/hr over 60 Minutes Intravenous Every 24 hours 05/04/16 1854     05/05/16 1600  cefTRIAXone (ROCEPHIN) 1 g in dextrose 5 % 50 mL IVPB     1 g 100 mL/hr over 30 Minutes Intravenous Every 24 hours 05/04/16 1854     05/04/16 1545  cefTRIAXone (ROCEPHIN) injection 1 g     1 g Intramuscular  Once 05/04/16 1535 05/04/16 1550   05/04/16 1545  azithromycin (ZITHROMAX) 500 mg in dextrose 5 % 250 mL IVPB     500 mg 250 mL/hr over 60 Minutes Intravenous  Once 05/04/16 1535 05/04/16 1730        Subjective:   Steven Krause was seen and examined today. Peripheral edema significantly improved today, no significant shortness of breath. Awaiting GI evaluation. No fevers or chills. Patient denies dizziness, abdominal pain, N/V/D/C, new  weakness, numbess, tingling. No acute events overnight.    Objective:   Vitals:   05/06/16 1524 05/06/16 2059 05/07/16 0516 05/07/16 1002  BP:  (!) 141/70 (!) 134/55 129/85  Pulse: (!) 0 67 (!) 54 (!) 51  Resp: (!) 0 18 18   Temp:  97.8 F (36.6 C) 97.8 F (36.6 C)   TempSrc:  Oral Oral   SpO2: (!) 0% 94% 92% 95%  Weight:   93.7 kg (206 lb 9.6 oz)   Height:        Intake/Output Summary (Last 24 hours) at 05/07/16 1314 Last data filed at 05/07/16 0817  Gross per 24 hour  Intake  120 ml  Output             1825 ml  Net            -1705 ml     Wt Readings from Last 3 Encounters:  05/07/16 93.7 kg (206 lb 9.6 oz)  04/18/16 93.9 kg (207 lb)  12/25/15 88.9 kg (196 lb)     Exam  General: Alert and oriented x 3, NAD  HEENT:    Neck: Supple, no JVD, no masses  Cardiovascular: S1 S2 Clear, RRR  Respiratory: Clear to auscultation bilaterally, no wheezing, rales or rhonchi  Gastrointestinal: Soft, nontender, nondistended, + bowel sounds  Ext: no cyanosis clubbing, trace  edema  Neuro: no new deficits  Skin: No rashes  Psych: Normal affect and demeanor, alert and oriented x3    Data Reviewed:  I have personally reviewed following labs and imaging studies  Micro Results No results found for this or any previous visit (from the past 240 hour(s)).  Radiology Reports Dg Chest 2 View  Result Date: 05/04/2016 CLINICAL DATA:  76 year old male with weakness, dizziness, shortness of breath. Initial encounter. EXAM: CHEST  2 VIEW COMPARISON:  12/16/2013 and earlier. FINDINGS: Mild cardiomegaly is new since 2015. Other mediastinal contours are within normal limits. Visualized tracheal air column is within normal limits. No pneumothorax or pulmonary edema. There is a small right pleural effusion with patchy mostly middle lobe right lung opacity. There is also subtle new right perihilar patchy opacity (arrows). Mild linear opacity at the left lung base most  resembles atelectasis. Chronic abdominal surgical clips. Negative visible bowel gas pattern. No acute osseous abnormality identified. IMPRESSION: 1. Small right pleural effusion with patchy multifocal right lung opacity which is nonspecific but suspicious for acute bronchopneumonia. 2. Mild cardiomegaly since 2015. Electronically Signed   By: Genevie Ann M.D.   On: 05/04/2016 14:44   Ct Angio Chest Pe W Or Wo Contrast  Result Date: 05/04/2016 CLINICAL DATA:  Worsening chest pain for 1 week. EXAM: CT ANGIOGRAPHY CHEST WITH CONTRAST TECHNIQUE: Multidetector CT imaging of the chest was performed using the standard protocol during bolus administration of intravenous contrast. Multiplanar CT image reconstructions and MIPs were obtained to evaluate the vascular anatomy. CONTRAST:  100 cc Isovue 370 IV COMPARISON:  Chest radiograph earlier this day FINDINGS: Cardiovascular: There are no filling defects within the pulmonary arteries to suggest pulmonary embolus. Mild multi chamber cardiomegaly. There is atherosclerosis of the thoracic aorta without aneurysm. Mediastinum/Nodes: Bulky right hilar adenopathy, node measures 3.4 cm. Enlarged lower paratracheal node suspected measuring 13 mm short axis, however motion artifact is present. There is a prominent sub- carinal node measuring 10 mm. No left hilar adenopathy. No definite supraclavicular adenopathy. Peripherally calcified nodule in the right lobe of the thyroid gland measures 15 mm. The esophagus is decompressed. Lungs/Pleura: Central right upper lobe pulmonary nodule measures 2.3 x 1.7 cm with ill-defined margins. There is perihilar ill-defined ground-glass opacity in the upper lobe, however breathing motion in this region, difficult to exclude involvement of superior segment of the right lower lobe. This ground-glass opacity appears multifocal but centered around the central airways. There are a few nodular opacities in the right lower lobe for example 6 x 8 mm nodule  image 86 series 407. Small right lower lobe nodules more inferiorly. Ill-defined density in the anterior most right lower lobe about the costophrenic sulcus. There is a 2.1 x 2.0 cm soft tissue density abutting the mediastinum in the right middle lobe  image 102 series 407. Within the lingula there is nonspecific 1.7 x 2.3 cm nodularity abutting the pleura. Ill-defined subpleural nodularity in the anterior left upper lobe image 72 series 407. There small bilateral pleural effusions. Upper Abdomen: No evidence of adrenal nodule. No definite acute abnormality. Laxity of the anterior abdominal wall with probable ventral abdominal wall hernia is only partially included. Musculoskeletal: There are no acute or suspicious osseous abnormalities. No blastic or destructive lytic lesions. Review of the MIP images confirms the above findings. IMPRESSION: 1. Irregular 2.3 cm right upper lobe pulmonary nodule suspicious for primary bronchogenic malignancy. There is bulky right hilar and mild mediastinal adenopathy. 2. Right upper lobe perihilar ground-glass opacities are nonspecific, a be neoplastic, infectious or inflammatory. 3. Paramediastinal soft tissue densities in the lingula and right middle lobe, left upper and right lower lobe pulmonary nodules are also nonspecific. 4. Small pleural effusions. 5. No pulmonary embolus. These results will be called to the ordering clinician or representative by the Radiologist Assistant, and communication documented in the PACS or zVision Dashboard. Electronically Signed   By: Jeb Levering M.D.   On: 05/04/2016 22:20    Lab Data:  CBC:  Recent Labs Lab 05/04/16 1454 05/04/16 1731 05/05/16 1009 05/06/16 0452 05/07/16 0430  WBC 7.4  --  8.8 8.9 7.8  NEUTROABS  --  6.5  --   --   --   HGB 9.2*  --  8.8* 8.6* 8.0*  HCT 29.8*  --  28.4* 28.0* 26.7*  MCV 82.3  --  82.1 81.4 82.4  PLT 449*  --  521* 466* 235*   Basic Metabolic Panel:  Recent Labs Lab 05/04/16 1454  05/05/16 0710 05/06/16 0452 05/07/16 0430  NA 133* 131* 131* 133*  K 3.5 3.6 4.0 3.9  CL 93* 91* 93* 94*  CO2 30 33* 29 30  GLUCOSE 115* 105* 106* 95  BUN 15 17 22* 18  CREATININE 1.16 1.09 1.14 1.12  CALCIUM 8.9 8.4* 8.9 8.6*   GFR: Estimated Creatinine Clearance: 64.4 mL/min (by C-G formula based on SCr of 1.12 mg/dL). Liver Function Tests: No results for input(s): AST, ALT, ALKPHOS, BILITOT, PROT, ALBUMIN in the last 168 hours. No results for input(s): LIPASE, AMYLASE in the last 168 hours. No results for input(s): AMMONIA in the last 168 hours. Coagulation Profile:  Recent Labs Lab 05/06/16 0452  INR 1.21   Cardiac Enzymes:  Recent Labs Lab 05/04/16 1900 05/05/16 0114 05/05/16 0710  TROPONINI <0.03 <0.03 <0.03   BNP (last 3 results) No results for input(s): PROBNP in the last 8760 hours. HbA1C: No results for input(s): HGBA1C in the last 72 hours. CBG: No results for input(s): GLUCAP in the last 168 hours. Lipid Profile: No results for input(s): CHOL, HDL, LDLCALC, TRIG, CHOLHDL, LDLDIRECT in the last 72 hours. Thyroid Function Tests: No results for input(s): TSH, T4TOTAL, FREET4, T3FREE, THYROIDAB in the last 72 hours. Anemia Panel:  Recent Labs  05/04/16 1900  VITAMINB12 262  FOLATE 10.4  FERRITIN 13*  TIBC 497*  IRON 11*  RETICCTPCT 2.5   Urine analysis:    Component Value Date/Time   BILIRUBINUR Neg 12/16/2013 1328   PROTEINUR Trace 12/16/2013 1328   UROBILINOGEN 0.2 12/16/2013 1328   NITRITE Neg 12/16/2013 1328   LEUKOCYTESUR Negative 12/16/2013 1328     Rand Boller M.D. [facility] 05/07/2016, 1:14 PM  Pager: [phone] Between 7am to 7pm - call Pager - [phone]  After 7pm go to [email or web address] - password [facility]  Call  night coverage person covering after 7pm

## 2016-05-07 NOTE — Progress Notes (Addendum)
Cross cover [facility] Subjective: [name] is a 76 year old white male admitted with chest pain history of atrial fibrillation multiple hypertension chronic pedal edema. He was found to have guaiac-positive stools with iron deficiency anemia admission and a cardiac catheterization done yesterday revealed very ectatic coronary arteries arteries with no significant stenosis and the cardiologist described the vessels is very ectatic with sluggish flow. Patient seems somewhat confused today when I spoke to him about the possible to having an EGD and a colonoscopy. He's had a colonoscopy done in 2014 her internal hemorrhoids were noted..   Objective: Vital signs in last 24 hours: Temp:  [97.8 F (36.6 C)] 97.8 F (36.6 C) (03/03 0516) Pulse Rate:  [0-67] 54 (03/03 0516) Resp:  [0-33] 18 (03/03 0516) BP: (116-150)/(55-81) 134/55 (03/03 0516) SpO2:  [0 %-96 %] 92 % (03/03 0516) Weight:  [93.7 kg (206 lb 9.6 oz)] 93.7 kg (206 lb 9.6 oz) (03/03 0516) Last BM Date: 05/05/16  Intake/Output from previous day: 03/02 0701 - 03/03 0700 In: 720 [P.O.:720] Out: 1425 [Urine:1425] Intake/Output this shift: Total I/O In: -  Out: 400 [Urine:400]  General appearance: alert, cooperative, appears older than stated age and no distress Resp: clear to auscultation bilaterally Cardio: irregularly irregular rhythm GI: soft, obese with abdominal wall hernias, non-tender; bowel sounds normal; no masses,  no organomegaly Extremities: edema 2+ to the knees  Lab Results:  Recent Labs  05/05/16 1009 05/06/16 0452 05/07/16 0430  WBC 8.8 8.9 7.8  HGB 8.8* 8.6* 8.0*  HCT 28.4* 28.0* 26.7*  PLT 521* 466* 433*   BMET  Recent Labs  05/05/16 0710 05/06/16 0452 05/07/16 0430  NA 131* 131* 133*  K 3.6 4.0 3.9  CL 91* 93* 94*  CO2 33* 29 30  GLUCOSE 105* 106* 95  BUN 17 22* 18  CREATININE 1.09 1.14 1.12  CALCIUM 8.4* 8.9 8.6*   LFT No results for input(s): PROT, ALBUMIN, AST, ALT, ALKPHOS,  BILITOT, BILIDIR, IBILI in the last 72 hours. PT/INR  Recent Labs  05/06/16 0452  LABPROT 15.3*  INR 1.21    Medications: I have reviewed the patient's current medications.  Assessment/Plan: 1) Anemia of chronic disease with positive guaiac positive stools. Patient seems to be very poor candidate for endoscopic procedures. He does not seem to be willing to undergo this procedure at this time but I will rediscuss the issue with him tomorrow. Overall plans from a GI standpoint will be discussed with Dr. Cleatrice Burke. 2) Atrial fibrillation on 022 05/04/2016 3) Hypertension/Hyperlipidemia 4) Acute diastolic congestive heart failure with pleural effusions  LOS: 3 days   Paislei Dorval 05/07/2016, 8:46 AM  er as well. Marland Kitchen  3)

## 2016-05-07 NOTE — Progress Notes (Signed)
Patient stable during 7 a to 7 pm shift, no complaints of pain.  Denies any shortness of breath however does appear to be short of breath at times with exertion.  Patient reported to RN 3 formed stools during this shift, did not see any blood.  Patient did shower after RN obtained order for shower and felt much better after this.

## 2016-05-08 DIAGNOSIS — I5041 Acute combined systolic (congestive) and diastolic (congestive) heart failure: Secondary | ICD-10-CM

## 2016-05-08 LAB — BASIC METABOLIC PANEL
Anion gap: 8 (ref 5–15)
BUN: 17 mg/dL (ref 6–20)
CALCIUM: 8.5 mg/dL — AB (ref 8.9–10.3)
CO2: 29 mmol/L (ref 22–32)
CREATININE: 1.03 mg/dL (ref 0.61–1.24)
Chloride: 95 mmol/L — ABNORMAL LOW (ref 101–111)
GLUCOSE: 100 mg/dL — AB (ref 65–99)
Potassium: 3.8 mmol/L (ref 3.5–5.1)
Sodium: 132 mmol/L — ABNORMAL LOW (ref 135–145)

## 2016-05-08 LAB — CBC
HCT: 27.2 % — ABNORMAL LOW (ref 39.0–52.0)
Hemoglobin: 8.2 g/dL — ABNORMAL LOW (ref 13.0–17.0)
MCH: 24.7 pg — AB (ref 26.0–34.0)
MCHC: 30.1 g/dL (ref 30.0–36.0)
MCV: 81.9 fL (ref 78.0–100.0)
PLATELETS: 412 10*3/uL — AB (ref 150–400)
RBC: 3.32 MIL/uL — AB (ref 4.22–5.81)
RDW: 14.9 % (ref 11.5–15.5)
WBC: 9 10*3/uL (ref 4.0–10.5)

## 2016-05-08 LAB — PROCALCITONIN: Procalcitonin: <0.1 ng/mL

## 2016-05-08 MED ORDER — PEG 3350-KCL-NA BICARB-NACL 420 G PO SOLR
4000.0000 mL | Freq: Once | ORAL | Status: AC
  Administered 2016-05-08: 4000 mL via ORAL
  Filled 2016-05-08: qty 4000

## 2016-05-08 MED ORDER — PEG 3350-KCL-NA BICARB-NACL 420 G PO SOLR: 4000.0000 mL | Freq: Once | ORAL | Status: DC

## 2016-05-08 MED ORDER — SODIUM CHLORIDE 0.9 % IV SOLN
INTRAVENOUS | Status: DC
  Administered 2016-05-08: via INTRAVENOUS

## 2016-05-08 NOTE — Progress Notes (Signed)
[facility]                                                                              Patient Demographics  Steven Krause, is a 76 y.o. male, DOB - 08-12-1940, WIO:[id number]  Admit date - 05/04/2016   Admitting Physician Kaitlen Redford Krystal Eaton, MD  Outpatient Primary MD for the patient is REED, [name], DO  Outpatient specialists:   LOS - 4  days    Chief Complaint  Patient presents with  . Shortness of Breath       Brief summary   Patient is a 76 year old male with history of polycythemia, abdominal hernia, hypertension, hypertension presented with shortness of breath and chest tightness since this morning and worsening peripheral edema in the last 2 weeks. History was obtained from the patient. The patient reported that he was in his baseline health this morning when he woke up. He was eating breakfast when he choked up on the eggs around 8 AM and since then he felt that he was not able to catch his breath. Patient also felt pressure-like chest tightness with no radiation. He does have abdominal distention but he attributes it to his abdominal hernia, no abdominal pain. He denies any orthopnea or PND however when he sat up for his examination, he stated he felt better. Patient denied any hematochezia or melena to me however to EDP, he reported intermittent block stools. hemoglobin 9.2. Hemoglobin was 10.2 on 2/12 and 12.9 on 12/25/15, gradually has been trending down.  FOBT positive   Assessment & Plan    Principal Problem:   Acute CHF (congestive heart failure) ([place]): Unknown EF - Patient presenting with exertional dyspnea, chest tightness and worsening peripheral edema in the last 2 weeks, elevated BNP. Previous echo in 2011 - Chest x-ray shows small right pleural effusion with patchy multifocal right lung opacity, nonspecific but suspicious for acute bronchopneumonia - Placed on strict I's and O's and daily weights, on IV diuresis with Lasix 40 mg q12hrs ,  Negative balance of 1.9 L - Troponins negative, d-dimer elevated, CT angiogram of the chest negative for PE however showed irregular 2.3 cm right upper lobe pulmonary nodule suspicious for primary bronchogenic malignancy with bulky right hilar and mediastinal adenopathy.  -  Doppler ultrasound of the lower extremities negative for DVT - Cardiac cath showed marked aneurysmal CAD, RCA large and aneurysmal, only obstructive disease is a tiny distal RCA branch plan medical management  Active Problems: 2.3 cm right upper lobe pulmonary nodule/tumor - CT angiogram of the chest was negative for PE however showed irregular 2.3 cm right upper lobe, the nodule suspicious for primary bronchogenic malignancy with bulky right hilar and mild mediastinal adenopathy - Explained in detail to the patient, discussed with pulmonology, Dr Rogene Houston,  will likely need outpatient PET CT and biopsy, recommended to make follow-up appointment outpatient - follow-up scheduled with Dr. Melvyn Novas on 05/12/16 at 3:30 PM    Chronic Anemia - Unclear etiology, FOBT positive on adm - Anemia panel shows iron deficiency anemia. Colonoscopy in 07/2012 had shown internal hemorrhoids otherwise negative - will hold xarelto, GI consulted, plan for EGD, colonoscopy after  the cardiac cath  - Discussed with Dr. Collene Mares this morning, plan on endoscopy tomorrow a.m., NPO after MN     Chronic atrial fibrillation (HCC) - Ventricular response slow in 60s, hence no beta blockers  - Patient is on xarelto, currently on hold secondary to anemia and positive FOBT    Essential hypertension - Currently stable, will place on low-dose  ACEI, Lasix  - Hold Norvasc due to acute CHF, no beta blocker secondary to bradycardia     CAP (community acquired pneumonia): Possibly due to aspiration due to choking on the breakfast  - Chest x-ray shows small right pleural effusion with patchy multifocal right lung opacity, nonspecific but suspicious for acute  bronchopneumonia - for now place on Zithromax, Rocephin, -Urine strep antigen negative, pro-calcitonin <0.1   Code Status: full DVT Prophylaxis:  SCD's Family Communication: Discussed in detail with the patient, all imaging results, lab results explained to the patient  Disposition Plan: Awaiting GI evaluation  Time Spent in minutes   15 minutes  Procedures:  CTA chest Cardiac  Cath   Consultants:   Cardiology GI Pulmonology  Antimicrobials:   IV Zithromax  IV Rocephin   Medications  Scheduled Meds: . azithromycin  500 mg Intravenous Q24H  . cefTRIAXone (ROCEPHIN)  IV  1 g Intravenous Q24H  . ezetimibe  10 mg Oral Daily  . furosemide  40 mg Intravenous Q12H  . iron polysaccharides  150 mg Oral Daily  . polyethylene glycol-electrolytes  4,000 mL Oral Once  . potassium chloride SA  20 mEq Oral Daily  . sodium chloride flush  3 mL Intravenous Q12H  . sodium chloride flush  3 mL Intravenous Q12H  . tamsulosin  0.4 mg Oral Daily   Continuous Infusions:  PRN Meds:.sodium chloride, sodium chloride, acetaminophen, HYDROcodone-acetaminophen, ondansetron (ZOFRAN) IV, sodium chloride flush, sodium chloride flush   Antibiotics   Anti-infectives    Start     Dose/Rate Route Frequency Ordered Stop   05/05/16 1600  azithromycin (ZITHROMAX) 500 mg in dextrose 5 % 250 mL IVPB     500 mg 250 mL/hr over 60 Minutes Intravenous Every 24 hours 05/04/16 1854     05/05/16 1600  cefTRIAXone (ROCEPHIN) 1 g in dextrose 5 % 50 mL IVPB     1 g 100 mL/hr over 30 Minutes Intravenous Every 24 hours 05/04/16 1854     05/04/16 1545  cefTRIAXone (ROCEPHIN) injection 1 g     1 g Intramuscular  Once 05/04/16 1535 05/04/16 1550   05/04/16 1545  azithromycin (ZITHROMAX) 500 mg in dextrose 5 % 250 mL IVPB     500 mg 250 mL/hr over 60 Minutes Intravenous  Once 05/04/16 1535 05/04/16 1730        Subjective:   Steven Krause was seen and examined today. Patient now agreeable for GI  procedures, peripheral edema still there otherwise shortness of breath has improved. Awaiting GI evaluation. No fevers or chills. Patient denies dizziness, abdominal pain, N/V/D/C, new weakness, numbess, tingling. No acute events overnight.    Objective:   Vitals:   05/07/16 0516 05/07/16 1002 05/07/16 1946 05/08/16 0634  BP: (!) 134/55 129/85 131/71 140/76  Pulse: (!) 54 (!) 51 (!) 52 (!) 56  Resp: 18     Temp: 97.8 F (36.6 C)  97.8 F (36.6 C) 98 F (36.7 C)  TempSrc: Oral  Oral Oral  SpO2: 92% 95% 94% 95%  Weight: 93.7 kg (206 lb 9.6 oz)   92 kg (202 lb 14.4  oz)  Height:        Intake/Output Summary (Last 24 hours) at 05/08/16 1057 Last data filed at 05/08/16 1610  Gross per 24 hour  Intake             1590 ml  Output             2075 ml  Net             -485 ml     Wt Readings from Last 3 Encounters:  05/08/16 92 kg (202 lb 14.4 oz)  04/18/16 93.9 kg (207 lb)  12/25/15 88.9 kg (196 lb)     Exam  General: Alert and oriented x 3, NAD  HEENT:    Neck: Supple, no JVD, no masses  Cardiovascular: S1 S2 Clear, RRR  Respiratory: Clear to auscultation bilaterally, no wheezing, rales or rhonchi  Gastrointestinal: Soft, nontender, nondistended, + bowel sounds  Ext: no cyanosis clubbing, 1+  edema  Neuro: no new deficits  Skin: No rashes  Psych: Normal affect and demeanor, alert and oriented x3    Data Reviewed:  I have personally reviewed following labs and imaging studies  Micro Results No results found for this or any previous visit (from the past 240 hour(s)).  Radiology Reports Dg Chest 2 View  Result Date: 05/04/2016 CLINICAL DATA:  76 year old male with weakness, dizziness, shortness of breath. Initial encounter. EXAM: CHEST  2 VIEW COMPARISON:  12/16/2013 and earlier. FINDINGS: Mild cardiomegaly is new since 2015. Other mediastinal contours are within normal limits. Visualized tracheal air column is within normal limits. No pneumothorax or pulmonary  edema. There is a small right pleural effusion with patchy mostly middle lobe right lung opacity. There is also subtle new right perihilar patchy opacity (arrows). Mild linear opacity at the left lung base most resembles atelectasis. Chronic abdominal surgical clips. Negative visible bowel gas pattern. No acute osseous abnormality identified. IMPRESSION: 1. Small right pleural effusion with patchy multifocal right lung opacity which is nonspecific but suspicious for acute bronchopneumonia. 2. Mild cardiomegaly since 2015. Electronically Signed   By: Genevie Ann M.D.   On: 05/04/2016 14:44   Ct Angio Chest Pe W Or Wo Contrast  Result Date: 05/04/2016 CLINICAL DATA:  Worsening chest pain for 1 week. EXAM: CT ANGIOGRAPHY CHEST WITH CONTRAST TECHNIQUE: Multidetector CT imaging of the chest was performed using the standard protocol during bolus administration of intravenous contrast. Multiplanar CT image reconstructions and MIPs were obtained to evaluate the vascular anatomy. CONTRAST:  100 cc Isovue 370 IV COMPARISON:  Chest radiograph earlier this day FINDINGS: Cardiovascular: There are no filling defects within the pulmonary arteries to suggest pulmonary embolus. Mild multi chamber cardiomegaly. There is atherosclerosis of the thoracic aorta without aneurysm. Mediastinum/Nodes: Bulky right hilar adenopathy, node measures 3.4 cm. Enlarged lower paratracheal node suspected measuring 13 mm short axis, however motion artifact is present. There is a prominent sub- carinal node measuring 10 mm. No left hilar adenopathy. No definite supraclavicular adenopathy. Peripherally calcified nodule in the right lobe of the thyroid gland measures 15 mm. The esophagus is decompressed. Lungs/Pleura: Central right upper lobe pulmonary nodule measures 2.3 x 1.7 cm with ill-defined margins. There is perihilar ill-defined ground-glass opacity in the upper lobe, however breathing motion in this region, difficult to exclude involvement of  superior segment of the right lower lobe. This ground-glass opacity appears multifocal but centered around the central airways. There are a few nodular opacities in the right lower lobe for example 6 x 8  mm nodule image 86 series 407. Small right lower lobe nodules more inferiorly. Ill-defined density in the anterior most right lower lobe about the costophrenic sulcus. There is a 2.1 x 2.0 cm soft tissue density abutting the mediastinum in the right middle lobe image 102 series 407. Within the lingula there is nonspecific 1.7 x 2.3 cm nodularity abutting the pleura. Ill-defined subpleural nodularity in the anterior left upper lobe image 72 series 407. There small bilateral pleural effusions. Upper Abdomen: No evidence of adrenal nodule. No definite acute abnormality. Laxity of the anterior abdominal wall with probable ventral abdominal wall hernia is only partially included. Musculoskeletal: There are no acute or suspicious osseous abnormalities. No blastic or destructive lytic lesions. Review of the MIP images confirms the above findings. IMPRESSION: 1. Irregular 2.3 cm right upper lobe pulmonary nodule suspicious for primary bronchogenic malignancy. There is bulky right hilar and mild mediastinal adenopathy. 2. Right upper lobe perihilar ground-glass opacities are nonspecific, a be neoplastic, infectious or inflammatory. 3. Paramediastinal soft tissue densities in the lingula and right middle lobe, left upper and right lower lobe pulmonary nodules are also nonspecific. 4. Small pleural effusions. 5. No pulmonary embolus. These results will be called to the ordering clinician or representative by the Radiologist Assistant, and communication documented in the PACS or zVision Dashboard. Electronically Signed   By: Jeb Levering M.D.   On: 05/04/2016 22:20    Lab Data:  CBC:  Recent Labs Lab 05/04/16 1454 05/04/16 1731 05/05/16 1009 05/06/16 0452 05/07/16 0430 05/08/16 0442  WBC 7.4  --  8.8 8.9 7.8  9.0  NEUTROABS  --  6.5  --   --   --   --   HGB 9.2*  --  8.8* 8.6* 8.0* 8.2*  HCT 29.8*  --  28.4* 28.0* 26.7* 27.2*  MCV 82.3  --  82.1 81.4 82.4 81.9  PLT 449*  --  521* 466* 433* 720*   Basic Metabolic Panel:  Recent Labs Lab 05/04/16 1454 05/05/16 0710 05/06/16 0452 05/07/16 0430 05/08/16 0442  NA 133* 131* 131* 133* 132*  K 3.5 3.6 4.0 3.9 3.8  CL 93* 91* 93* 94* 95*  CO2 30 33* '29 30 29  '$ GLUCOSE 115* 105* 106* 95 100*  BUN 15 17 22* 18 17  CREATININE 1.16 1.09 1.14 1.12 1.03  CALCIUM 8.9 8.4* 8.9 8.6* 8.5*   GFR: Estimated Creatinine Clearance: 69.4 mL/min (by C-G formula based on SCr of 1.03 mg/dL). Liver Function Tests: No results for input(s): AST, ALT, ALKPHOS, BILITOT, PROT, ALBUMIN in the last 168 hours. No results for input(s): LIPASE, AMYLASE in the last 168 hours. No results for input(s): AMMONIA in the last 168 hours. Coagulation Profile:  Recent Labs Lab 05/06/16 0452  INR 1.21   Cardiac Enzymes:  Recent Labs Lab 05/04/16 1900 05/05/16 0114 05/05/16 0710  TROPONINI <0.03 <0.03 <0.03   BNP (last 3 results) No results for input(s): PROBNP in the last 8760 hours. HbA1C: No results for input(s): HGBA1C in the last 72 hours. CBG: No results for input(s): GLUCAP in the last 168 hours. Lipid Profile: No results for input(s): CHOL, HDL, LDLCALC, TRIG, CHOLHDL, LDLDIRECT in the last 72 hours. Thyroid Function Tests: No results for input(s): TSH, T4TOTAL, FREET4, T3FREE, THYROIDAB in the last 72 hours. Anemia Panel: No results for input(s): VITAMINB12, FOLATE, FERRITIN, TIBC, IRON, RETICCTPCT in the last 72 hours. Urine analysis:    Component Value Date/Time   BILIRUBINUR Neg 12/16/2013 1328   PROTEINUR Trace 12/16/2013  1328   UROBILINOGEN 0.2 12/16/2013 1328   NITRITE Neg 12/16/2013 1328   LEUKOCYTESUR Negative 12/16/2013 Higgins M.D. [facility] 05/08/2016, 10:57 AM  Pager: [phone] Between 7am to 7pm - call Pager  - [phone]  After 7pm go to [email or web address] - password [email or web address]  Call night coverage person covering after 7pm

## 2016-05-08 NOTE — Progress Notes (Signed)
[facility]OSS [facility]OVER [facility]GI Subjective: Since I last saw last saw the patient, Dr. Tana Coast from [facility] has had an extensive discussion with the patient about his urgently for endoscopic procedures solar the source of GI blood loss can be pinpointed and his anticoagulation resumed as he has extensive cardiac history including atrial fibrillation and acute diastolic congestive heart failure.  Objective: Vital signs in last 24 hours: Temp:  [97.8 F (36.6 C)-98 F (36.7 C)] 98 F (36.7 C) (03/04 0634) Pulse Rate:  [51-56] 56 (03/04 0634) BP: (129-140)/(71-85) 140/76 (03/04 0634) SpO2:  [94 %-95 %] 95 % (03/04 0634) Weight:  [92 kg (202 lb 14.4 oz)] 92 kg (202 lb 14.4 oz) (03/04 0634) Last BM Date: 05/08/16  Intake/Output from previous day: 03/03 0701 - 03/04 0700 In: 1350 [P.O.:600; IV Piggyback:750] Out: 1600 [Urine:1600] Intake/Output this shift: Total I/O In: 240 [P.O.:240] Out: 175 [Urine:175]  Alert, oriented x 3 in NAD  Chest: CTA  Cardio: regular rate and rhythm, S1, S2 normal, no murmur, click, rub or gallop GI: soft, obese with large abdominal wall hernias; tnon-tender; bowel sounds normal; no masses,  no organomegaly Extremities: extremities normal, atraumatic, no cyanosis or edema  Lab Results:  Recent Labs  05/06/16 0452 05/07/16 0430 05/08/16 0442  WBC 8.9 7.8 9.0  HGB 8.6* 8.0* 8.2*  HCT 28.0* 26.7* 27.2*  PLT 466* 433* 412*   BMET  Recent Labs  05/06/16 0452 05/07/16 0430 05/08/16 0442  NA 131* 133* 132*  K 4.0 3.9 3.8  CL 93* 94* 95*  CO2 '29 30 29  '$ GLUCOSE 106* 95 100*  BUN 22* 18 17  CREATININE 1.14 1.12 1.03  CALCIUM 8.9 8.6* 8.5*   LFT No results for input(s): PROT, ALBUMIN, AST, ALT, ALKPHOS, BILITOT, BILIDIR, IBILI in the last 72 hours. PT/INR  Recent Labs  05/06/16 0452  LABPROT 15.3*  INR 1.21   Medications: I have reviewed the patient's current medications.  Assessment/Plan: 1) Anemia with guaiac-positive stools-an EGD and colonoscopy are  being planned for tomorrow further recommendations made thereafter.  2) Chest pain/HTN/Atrial fibrillation.  LOS: 4 days   Elsie Sakuma 05/08/2016, 9:37 AM

## 2016-05-08 NOTE — Progress Notes (Signed)
Pt educated about safety and importance of bed alarm during the night however pt refuses to be on bed alarm. Will continue to round on patient.   Jacob Cicero, RN    

## 2016-05-08 NOTE — Progress Notes (Signed)
Progress Note  Patient Name: Steven Krause Date of Encounter: 05/08/2016  Primary Cardiologist: Dr. Sallyanne Kuster  Subjective   76 yo admitted with CP,  Hx of atrial fib Moderate pulmonary HTN Has chronic leg edema   Had cath History which revealed very ectatic coronary arteries.    There were no significant stenosis but all of the vessels were very ectatic and was sluggish flow.   Inpatient Medications    Scheduled Meds: . azithromycin  500 mg Intravenous Q24H  . cefTRIAXone (ROCEPHIN)  IV  1 g Intravenous Q24H  . ezetimibe  10 mg Oral Daily  . furosemide  40 mg Intravenous Q12H  . iron polysaccharides  150 mg Oral Daily  . polyethylene glycol-electrolytes  4,000 mL Oral Once  . potassium chloride SA  20 mEq Oral Daily  . sodium chloride flush  3 mL Intravenous Q12H  . sodium chloride flush  3 mL Intravenous Q12H  . tamsulosin  0.4 mg Oral Daily   Continuous Infusions:  PRN Meds: sodium chloride, sodium chloride, acetaminophen, HYDROcodone-acetaminophen, ondansetron (ZOFRAN) IV, sodium chloride flush, sodium chloride flush   Vital Signs    Vitals:   05/07/16 0516 05/07/16 1002 05/07/16 1946 05/08/16 0634  BP: (!) 134/55 129/85 131/71 140/76  Pulse: (!) 54 (!) 51 (!) 52 (!) 56  Resp: 18     Temp: 97.8 F (36.6 C)  97.8 F (36.6 C) 98 F (36.7 C)  TempSrc: Oral  Oral Oral  SpO2: 92% 95% 94% 95%  Weight: 206 lb 9.6 oz (93.7 kg)   202 lb 14.4 oz (92 kg)  Height:        Intake/Output Summary (Last 24 hours) at 05/08/16 1132 Last data filed at 05/08/16 0942  Gross per 24 hour  Intake             1590 ml  Output             2075 ml  Net             -485 ml   Filed Weights   05/06/16 0502 05/07/16 0516 05/08/16 0634  Weight: 211 lb 9.6 oz (96 kg) 206 lb 9.6 oz (93.7 kg) 202 lb 14.4 oz (92 kg)    Telemetry    Atrial fib in the 40's and 50's until 2200 when pt removed monitor. Now convinced to leave monitor on. - Personally Reviewed  ECG    No new  tracing  Physical Exam   GEN: No acute distress.   Neck: No JVD Cardiac: Irreg, no murmurs, rubs, or gallops.  Respiratory: Clear to auscultation bilaterally. Diminished GI: Soft, nontender, large abdominal hernias present. Transverse scar noted. MS: 2-3+ pitting edema up to knees. No deformity. Neuro:  Nonfocal  Psych: Normal affect   Labs    Chemistry  Recent Labs Lab 05/06/16 0452 05/07/16 0430 05/08/16 0442  NA 131* 133* 132*  K 4.0 3.9 3.8  CL 93* 94* 95*  CO2 '29 30 29  '$ GLUCOSE 106* 95 100*  BUN 22* 18 17  CREATININE 1.14 1.12 1.03  CALCIUM 8.9 8.6* 8.5*  GFRNONAA >60 >60 >60  GFRAA >60 >60 >60  ANIONGAP '9 9 8     '$ Hematology  Recent Labs Lab 05/06/16 0452 05/07/16 0430 05/08/16 0442  WBC 8.9 7.8 9.0  RBC 3.44* 3.24* 3.32*  HGB 8.6* 8.0* 8.2*  HCT 28.0* 26.7* 27.2*  MCV 81.4 82.4 81.9  MCH 25.0* 24.7* 24.7*  MCHC 30.7 30.0 30.1  RDW 15.1 15.2  14.9  PLT 466* 433* 412*    Cardiac Enzymes  Recent Labs Lab 05/04/16 1900 05/05/16 0114 05/05/16 0710  TROPONINI <0.03 <0.03 <0.03     Recent Labs Lab 05/04/16 1520  TROPIPOC 0.02     BNP  Recent Labs Lab 05/04/16 1454 05/06/16 0452  BNP 216.0* 251.1*     DDimer   Recent Labs Lab 05/04/16 1454  DDIMER 1.10*     Radiology    No results found.  Cardiac Studies   ECHO 05/07/2015 Study Conclusions  - Left ventricle: The cavity size was normal. Systolic function was   normal. The estimated ejection fraction was in the range of 60%   to 65%. Wall motion was normal; there were no regional wall   motion abnormalities. Features are consistent with a pseudonormal   left ventricular filling pattern, with concomitant abnormal   relaxation and increased filling pressure (grade 2 diastolic   dysfunction). - Aortic valve: Trileaflet; mildly thickened, mildly calcified   leaflets. Valve mobility was restricted. There was very mild   stenosis. Peak velocity (S): 248 cm/s. Mean gradient  (S): 10 mm   Hg. - Left atrium: The atrium was mildly dilated. - Right atrium: The atrium was mildly dilated. - Pulmonary arteries: Systolic pressure was moderately increased.   PA peak pressure: 51 mm Hg (S).  Patient Profile     76 y.o. male with a PMH significant for rate controlled Afib on xarelto with frequent PVCs, smoker, HTN, anemia, and CHF. He presented to the ED with exertional chest discomfort and dyspnea. Cardiology was consulted for chest pain and possible CHF.   Assessment & Plan    1. Chest pain - EKG without signs of ischemia; troponin x 3 negative - pt has history of HTN, HLD, and smoking and strong family history of heart disease - pt has had chest pain with exertion that is relieved with rest x 3 days. None overnight.  -Echo shows normal LV EF, Grade 2 diastolic dysfunction, no regional WMA  cath Shows ectatic coronary arteries.  2. Acute diastolic CHF, pleural effusion, LE edema - echocardiogram shows EF 60-65%, Grade 2 DD - BNP 219 on admission. Today BNP 251. - pt presented with pleural effusions and LE edema; however, given his low BNP this volume may be related to pulmonary nodules concerning for bronchogenic malignancy vs PNA - diuresing per primary team with lasix 40 mg IV BID initially diuresed 1.4L, net negative 2 L   renal function remains stable  - albumin is low (3.5), likely contributing to his edema   3. Permanent atrial fibrillation, rate controlled - pt denies dizziness, lightheadedness, and palpitations - last dose of xarelto was 05/04/16 evening - not currently on a beta blocker at home 2/2 bradycardia; ventricular rates in the 60s   3. HTN - home meds: norvasc, spironolactone, lasix   4. Positive D-Dimer - CTA ruled out PE  Venous 2+ scan reveals no evidence of DVT. Ive recommended leg elevation   5. Anemia - positive FOBT Getting prepped for colonoscopy tomorrow .     Mertie Moores, MD  05/08/2016 11:32 AM    [facility] [place],  [address] [place], [place]  [address] Pager [phone] Phone: [phone]; Fax: [phone]

## 2016-05-08 NOTE — Progress Notes (Signed)
Patient states he cannot tolerate Golytely because he threw it up twice. GI MD, notified. States to have patient sip it slowly.

## 2016-05-08 NOTE — Progress Notes (Signed)
Patient scheduled for colonoscopy tomorow, stated cannot drink  Go Lytely, makes him "sick". Completed 1500/4000 ml. MD notified. Will continue to monitor.  Kyerra Vargo, RN

## 2016-05-09 ENCOUNTER — Encounter (HOSPITAL_COMMUNITY): Payer: Self-pay | Admitting: Cardiology

## 2016-05-09 DIAGNOSIS — I5031 Acute diastolic (congestive) heart failure: Secondary | ICD-10-CM

## 2016-05-09 LAB — BASIC METABOLIC PANEL
Anion gap: 4 — ABNORMAL LOW (ref 5–15)
BUN: 13 mg/dL (ref 6–20)
CALCIUM: 8.4 mg/dL — AB (ref 8.9–10.3)
CHLORIDE: 96 mmol/L — AB (ref 101–111)
CO2: 32 mmol/L (ref 22–32)
CREATININE: 1.03 mg/dL (ref 0.61–1.24)
GFR calc non Af Amer: >60 mL/min (ref >60)
Glucose, Bld: 98 mg/dL (ref 65–99)
Potassium: 4.4 mmol/L (ref 3.5–5.1)
SODIUM: 132 mmol/L — AB (ref 135–145)

## 2016-05-09 MED ORDER — POLYETHYLENE GLYCOL 3350 17 GM/SCOOP PO POWD
1.0000 | Freq: Once | ORAL | Status: AC
  Administered 2016-05-09: 255 g via ORAL
  Filled 2016-05-09: qty 255

## 2016-05-09 MED ORDER — PANTOPRAZOLE SODIUM 40 MG PO TBEC
40.0000 mg | DELAYED_RELEASE_TABLET | Freq: Every day | ORAL | Status: DC
  Administered 2016-05-09 – 2016-05-12 (×3): 40 mg via ORAL
  Filled 2016-05-09 (×3): qty 1

## 2016-05-09 NOTE — Progress Notes (Signed)
Patient slept overnight. No complaints or concerns during the 7 pm -7am shift. Patient not able to tolerated Golytely, refused to drink, however patient was kept NPO after midnight. Vital signs stable, HR went down to 29 bpm at some point during the night, patient asymptomatic and sleep in that moment. Will continue to monitor.  Amaliya Whitelaw, RN

## 2016-05-09 NOTE — Progress Notes (Signed)
Progress Note  Patient Name: Steven Krause Date of Encounter: 05/09/2016  Primary Cardiologist: Dr. Sallyanne Kuster   Subjective   No chest pain no chest pain. No dyspnea. Still with BRBPR.   Inpatient Medications    Scheduled Meds: . azithromycin  500 mg Intravenous Q24H  . cefTRIAXone (ROCEPHIN)  IV  1 g Intravenous Q24H  . ezetimibe  10 mg Oral Daily  . furosemide  40 mg Intravenous Q12H  . iron polysaccharides  150 mg Oral Daily  . pantoprazole  40 mg Oral Q0600  . potassium chloride SA  20 mEq Oral Daily  . sodium chloride flush  3 mL Intravenous Q12H  . sodium chloride flush  3 mL Intravenous Q12H  . tamsulosin  0.4 mg Oral Daily   Continuous Infusions: . sodium chloride 20 mL/hr (05/09/16 0012)   PRN Meds: sodium chloride, sodium chloride, acetaminophen, HYDROcodone-acetaminophen, ondansetron (ZOFRAN) IV, sodium chloride flush, sodium chloride flush   Vital Signs    Vitals:   05/08/16 2145 05/09/16 0604 05/09/16 0713 05/09/16 1334  BP: (!) 121/54 129/66  137/72  Pulse: (!) 45 99  (!) 49  Resp:  18  18  Temp: 98 F (36.7 C) 97.4 F (36.3 C)  97.8 F (36.6 C)  TempSrc: Oral Oral  Oral  SpO2: 98% 97%  99%  Weight:   199 lb 12.8 oz (90.6 kg)   Height:        Intake/Output Summary (Last 24 hours) at 05/09/16 1509 Last data filed at 05/09/16 1500  Gross per 24 hour  Intake          1085.33 ml  Output              420 ml  Net           665.33 ml   Filed Weights   05/07/16 0516 05/08/16 0634 05/09/16 0713  Weight: 206 lb 9.6 oz (93.7 kg) 202 lb 14.4 oz (92 kg) 199 lb 12.8 oz (90.6 kg)    Telemetry    Atrial fibrillation with a SVR- Personally Reviewed    Physical Exam   GEN: No acute distress.  Dentition in poor repair.  Neck: No JVD Cardiac: irreguarlly irregular, bradycardia no murmurs, rubs, or gallops.  Respiratory: faint bibasilar rales. GI: Soft, nontender, non-distended  MS: 3+ bilateral LEE pitting edema; No deformity. Neuro:  Nonfocal    Psych: Normal affect   Labs    Chemistry Recent Labs Lab 05/07/16 0430 05/08/16 0442 05/09/16 0528  NA 133* 132* 132*  K 3.9 3.8 4.4  CL 94* 95* 96*  CO2 30 29 32  GLUCOSE 95 100* 98  BUN '18 17 13  '$ CREATININE 1.12 1.03 1.03  CALCIUM 8.6* 8.5* 8.4*  GFRNONAA >60 >60 >60  GFRAA >60 >60 >60  ANIONGAP 9 8 4*     Hematology Recent Labs Lab 05/06/16 0452 05/07/16 0430 05/08/16 0442  WBC 8.9 7.8 9.0  RBC 3.44* 3.24* 3.32*  HGB 8.6* 8.0* 8.2*  HCT 28.0* 26.7* 27.2*  MCV 81.4 82.4 81.9  MCH 25.0* 24.7* 24.7*  MCHC 30.7 30.0 30.1  RDW 15.1 15.2 14.9  PLT 466* 433* 412*    Cardiac Enzymes Recent Labs Lab 05/04/16 1900 05/05/16 0114 05/05/16 0710  TROPONINI <0.03 <0.03 <0.03    Recent Labs Lab 05/04/16 1520  TROPIPOC 0.02     BNP Recent Labs Lab 05/04/16 1454 05/06/16 0452  BNP 216.0* 251.1*     DDimer  Recent Labs Lab 05/04/16 1454  DDIMER 1.10*     Radiology    No results found.  Cardiac Studies   Procedures   Left Heart Cath and Coronary Angiography  Conclusion     Dist LAD-2 lesion, 30 %stenosed.  Dist LAD-1 lesion, 40 %stenosed.  Inf Sept lesion, 90 %stenosed.  LV end diastolic pressure is mildly elevated.   1. 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  2. Only obstructive disease is in a tiny distal RCA branch. 3. Elevated LVEDP  Plan: medical management.      Patient Profile     76 y.o. male with a PMH significant for rate controlled Afib on xarelto with frequent PVCs, smoker, HTN, anemia, and CHF. He presented to the ED with exertional chest discomfort and dyspnea. Cardiology was consulted for chest pain and possible CHF.   Assessment & Plan    1. Chest pain - EKG without signs of ischemia; troponin x 3 negative - pt has history of HTN, HLD, and smoking and strong family history of heart disease - pt has had chest pain with exertion that is relieved with rest x 3  days. None overnight.  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 Only obstructive disease is a tiny distal RCA branch. Medical management recommended- however medical therapies limited currently. No ASA currently given anemia + GI w/u for GIB (FOBT +). No BB given issues with bradycardia. Intolerant to statins. On Zetia. He is stable w/o CP. No dyspnea.   2. Acute diastolic CHF, pleural effusion, LE edema - echocardiogram shows EF 60-65%, Grade 2 DD - BNP 251. - he needs additional diuresis, he has 3+ bilateral LEE.  - Increase Lasix to 80 mg IV BID   3. Permanent atrial fibrillation, rate controlled - pt denies dizziness, lightheadedness, and palpitations - last dose of xarelto was 05/04/16 evening. On hold for anemia + GI w/u - not currently on a beta blocker at home 2/2 bradycardia; ventricular rates in the 50s- 60s   3. HTN - controlled on current regimen    4. Positive D-Dimer - CTA ruled out PE Venous 2+ scan reveals no evidence of DVT.  5. Anemia - positive FOBT Getting prepped for colonoscopy tomorrow   Signed, Lyda Jester, PA-C  05/09/2016, 3:09 PM    Personally seen and examined. Agree with above.  Colonoscopy tomorrow - prep Diastolic HF - still appears fluid overloaded - increasing lasix to 80 BID AFIB under good control without beta blockers Still with BRBPR - AAOx3, faint rales, protuberant abd. Poor dentition  Holding Xarelto  Candee Furbish, MD

## 2016-05-09 NOTE — Progress Notes (Signed)
[facility]                                                                              Patient Demographics  Steven Krause, is a 76 y.o. male, DOB - 04-01-1940, JGO:[id number]  Admit date - 05/04/2016   Admitting Physician Nayely Dingus Krystal Eaton, MD  Outpatient Primary MD for the patient is REED, [name], DO  Outpatient specialists:   LOS - 5  days    Chief Complaint  Patient presents with  . Shortness of Breath       Brief summary   Patient is a 76 year old male with history of polycythemia, abdominal hernia, hypertension, hypertension presented with shortness of breath and chest tightness since this morning and worsening peripheral edema in the last 2 weeks. History was obtained from the patient. The patient reported that he was in his baseline health this morning when he woke up. He was eating breakfast when he choked up on the eggs around 8 AM and since then he felt that he was not able to catch his breath. Patient also felt pressure-like chest tightness with no radiation. He does have abdominal distention but he attributes it to his abdominal hernia, no abdominal pain. He denies any orthopnea or PND however when he sat up for his examination, he stated he felt better. Patient denied any hematochezia or melena to me however to EDP, he reported intermittent block stools. hemoglobin 9.2. Hemoglobin was 10.2 on 2/12 and 12.9 on 12/25/15, gradually has been trending down.  FOBT positive   Assessment & Plan    Principal Problem:   Acute CHF (congestive heart failure) ([place]): Unknown EF - Patient presenting with exertional dyspnea, chest tightness and worsening peripheral edema in the last 2 weeks, elevated BNP. Previous echo in 2011 - Chest x-ray shows small right pleural effusion with patchy multifocal right lung opacity, nonspecific but suspicious for acute bronchopneumonia - on IV diuresis with Lasix 40 mg q12hrs , Negative balance of 1.4 L - Troponins negative,  d-dimer elevated, CT angiogram of the chest negative for PE however showed irregular 2.3 cm right upper lobe pulmonary nodule suspicious for primary bronchogenic malignancy with bulky right hilar and mediastinal adenopathy.  -  Doppler ultrasound of the lower extremities negative for DVT - Cardiac cath showed marked aneurysmal CAD, RCA large and aneurysmal, only obstructive disease is a tiny distal RCA branch plan medical management - Peripheral edema likely due to hypoalbuminemia  Active Problems: 2.3 cm right upper lobe pulmonary nodule/tumor - CT angiogram of the chest was negative for PE however showed irregular 2.3 cm right upper lobe, the nodule suspicious for primary bronchogenic malignancy with bulky right hilar and mild mediastinal adenopathy - Explained in detail to the patient, discussed with pulmonology, Dr Rogene Houston,  will likely need outpatient PET CT and biopsy, recommended to make follow-up appointment outpatient - follow-up scheduled with Dr. Melvyn Novas on 05/12/16 at 3:30 PM    Chronic Anemia - Unclear etiology, FOBT positive on adm - Anemia panel shows iron deficiency anemia. Colonoscopy in 07/2012 had shown internal hemorrhoids otherwise negative - Xarelto currently held, plan for endoscopy in a.m.    Chronic  atrial fibrillation ([facility]) - Ventricular response slow in 60s, hence no beta blockers  - Patient is on xarelto, currently on hold secondary to anemia and positive FOBT    Essential hypertension - Currently stable, will place on low-dose  ACEI, Lasix  - Hold Norvasc due to acute CHF, no beta blocker secondary to bradycardia     CAP (community acquired pneumonia): Possibly due to aspiration due to choking on the breakfast  - Chest x-ray shows small right pleural effusion with patchy multifocal right lung opacity, nonspecific but suspicious for acute bronchopneumonia - for now place on Zithromax, Rocephin, -Urine strep antigen negative, pro-calcitonin <0.1   Code Status:  full DVT Prophylaxis:  SCD's Family Communication: Discussed in detail with the patient, all imaging results, lab results explained to the patient  Disposition Plan: Awaiting GI evaluation  Time Spent in minutes   15 minutes  Procedures:  CTA chest Cardiac  Cath   Consultants:   Cardiology GI Pulmonology  Antimicrobials:   IV Zithromax  IV Rocephin   Medications  Scheduled Meds: . azithromycin  500 mg Intravenous Q24H  . cefTRIAXone (ROCEPHIN)  IV  1 g Intravenous Q24H  . ezetimibe  10 mg Oral Daily  . furosemide  40 mg Intravenous Q12H  . iron polysaccharides  150 mg Oral Daily  . pantoprazole  40 mg Oral Q0600  . polyethylene glycol powder  1 Container Oral Once  . potassium chloride SA  20 mEq Oral Daily  . sodium chloride flush  3 mL Intravenous Q12H  . sodium chloride flush  3 mL Intravenous Q12H  . tamsulosin  0.4 mg Oral Daily   Continuous Infusions: . sodium chloride 20 mL/hr (05/09/16 0012)   PRN Meds:.sodium chloride, sodium chloride, acetaminophen, HYDROcodone-acetaminophen, ondansetron (ZOFRAN) IV, sodium chloride flush, sodium chloride flush   Antibiotics   Anti-infectives    Start     Dose/Rate Route Frequency Ordered Stop   05/05/16 1600  azithromycin (ZITHROMAX) 500 mg in dextrose 5 % 250 mL IVPB     500 mg 250 mL/hr over 60 Minutes Intravenous Every 24 hours 05/04/16 1854     05/05/16 1600  cefTRIAXone (ROCEPHIN) 1 g in dextrose 5 % 50 mL IVPB     1 g 100 mL/hr over 30 Minutes Intravenous Every 24 hours 05/04/16 1854     05/04/16 1545  cefTRIAXone (ROCEPHIN) injection 1 g     1 g Intramuscular  Once 05/04/16 1535 05/04/16 1550   05/04/16 1545  azithromycin (ZITHROMAX) 500 mg in dextrose 5 % 250 mL IVPB     500 mg 250 mL/hr over 60 Minutes Intravenous  Once 05/04/16 1535 05/04/16 1730        Subjective:   Steven Krause was seen and examined today. Incomplete prep, patient was not able to tolerate colonoscopy. Endoscopy procedures now  planned for tomorrow morning. No fevers or chills. Patient denies dizziness, abdominal pain, N/V/D/C, new weakness, numbess, tingling. No acute events overnight.  Shortness of breath is improving still has leg edema  Objective:   Vitals:   05/08/16 1233 05/08/16 2145 05/09/16 0604 05/09/16 0713  BP: 136/79 (!) 121/54 129/66   Pulse: (!) 106 (!) 45 99   Resp: 20  18   Temp: 97.5 F (36.4 C) 98 F (36.7 C) 97.4 F (36.3 C)   TempSrc: Oral Oral Oral   SpO2: 95% 98% 97%   Weight:    90.6 kg (199 lb 12.8 oz)  Height:  Intake/Output Summary (Last 24 hours) at 05/09/16 1125 Last data filed at 05/09/16 0940  Gross per 24 hour  Intake           788.33 ml  Output              420 ml  Net           368.33 ml     Wt Readings from Last 3 Encounters:  05/09/16 90.6 kg (199 lb 12.8 oz)  04/18/16 93.9 kg (207 lb)  12/25/15 88.9 kg (196 lb)     Exam  General: Alert and oriented x 3, NAD  HEENT:    Neck: Supple, no JVD, no masses  Cardiovascular: S1 S2 Clear, RRR  Respiratory: Clear to auscultation bilaterally, no wheezing, rales or rhonchi  Gastrointestinal: Soft, nontender, nondistended, + bowel sounds  Ext: no cyanosis clubbing, 1-2+  edema  Neuro: no new deficits  Skin: No rashes  Psych: Normal affect and demeanor, alert and oriented x3    Data Reviewed:  I have personally reviewed following labs and imaging studies  Micro Results No results found for this or any previous visit (from the past 240 hour(s)).  Radiology Reports Dg Chest 2 View  Result Date: 05/04/2016 CLINICAL DATA:  76 year old male with weakness, dizziness, shortness of breath. Initial encounter. EXAM: CHEST  2 VIEW COMPARISON:  12/16/2013 and earlier. FINDINGS: Mild cardiomegaly is new since 2015. Other mediastinal contours are within normal limits. Visualized tracheal air column is within normal limits. No pneumothorax or pulmonary edema. There is a small right pleural effusion with patchy  mostly middle lobe right lung opacity. There is also subtle new right perihilar patchy opacity (arrows). Mild linear opacity at the left lung base most resembles atelectasis. Chronic abdominal surgical clips. Negative visible bowel gas pattern. No acute osseous abnormality identified. IMPRESSION: 1. Small right pleural effusion with patchy multifocal right lung opacity which is nonspecific but suspicious for acute bronchopneumonia. 2. Mild cardiomegaly since 2015. Electronically Signed   By: Genevie Ann M.D.   On: 05/04/2016 14:44   Ct Angio Chest Pe W Or Wo Contrast  Result Date: 05/04/2016 CLINICAL DATA:  Worsening chest pain for 1 week. EXAM: CT ANGIOGRAPHY CHEST WITH CONTRAST TECHNIQUE: Multidetector CT imaging of the chest was performed using the standard protocol during bolus administration of intravenous contrast. Multiplanar CT image reconstructions and MIPs were obtained to evaluate the vascular anatomy. CONTRAST:  100 cc Isovue 370 IV COMPARISON:  Chest radiograph earlier this day FINDINGS: Cardiovascular: There are no filling defects within the pulmonary arteries to suggest pulmonary embolus. Mild multi chamber cardiomegaly. There is atherosclerosis of the thoracic aorta without aneurysm. Mediastinum/Nodes: Bulky right hilar adenopathy, node measures 3.4 cm. Enlarged lower paratracheal node suspected measuring 13 mm short axis, however motion artifact is present. There is a prominent sub- carinal node measuring 10 mm. No left hilar adenopathy. No definite supraclavicular adenopathy. Peripherally calcified nodule in the right lobe of the thyroid gland measures 15 mm. The esophagus is decompressed. Lungs/Pleura: Central right upper lobe pulmonary nodule measures 2.3 x 1.7 cm with ill-defined margins. There is perihilar ill-defined ground-glass opacity in the upper lobe, however breathing motion in this region, difficult to exclude involvement of superior segment of the right lower lobe. This ground-glass  opacity appears multifocal but centered around the central airways. There are a few nodular opacities in the right lower lobe for example 6 x 8 mm nodule image 86 series 407. Small right lower lobe nodules more inferiorly.  Ill-defined density in the anterior most right lower lobe about the costophrenic sulcus. There is a 2.1 x 2.0 cm soft tissue density abutting the mediastinum in the right middle lobe image 102 series 407. Within the lingula there is nonspecific 1.7 x 2.3 cm nodularity abutting the pleura. Ill-defined subpleural nodularity in the anterior left upper lobe image 72 series 407. There small bilateral pleural effusions. Upper Abdomen: No evidence of adrenal nodule. No definite acute abnormality. Laxity of the anterior abdominal wall with probable ventral abdominal wall hernia is only partially included. Musculoskeletal: There are no acute or suspicious osseous abnormalities. No blastic or destructive lytic lesions. Review of the MIP images confirms the above findings. IMPRESSION: 1. Irregular 2.3 cm right upper lobe pulmonary nodule suspicious for primary bronchogenic malignancy. There is bulky right hilar and mild mediastinal adenopathy. 2. Right upper lobe perihilar ground-glass opacities are nonspecific, a be neoplastic, infectious or inflammatory. 3. Paramediastinal soft tissue densities in the lingula and right middle lobe, left upper and right lower lobe pulmonary nodules are also nonspecific. 4. Small pleural effusions. 5. No pulmonary embolus. These results will be called to the ordering clinician or representative by the Radiologist Assistant, and communication documented in the PACS or zVision Dashboard. Electronically Signed   By: Jeb Levering M.D.   On: 05/04/2016 22:20    Lab Data:  CBC:  Recent Labs Lab 05/04/16 1454 05/04/16 1731 05/05/16 1009 05/06/16 0452 05/07/16 0430 05/08/16 0442  WBC 7.4  --  8.8 8.9 7.8 9.0  NEUTROABS  --  6.5  --   --   --   --   HGB 9.2*  --   8.8* 8.6* 8.0* 8.2*  HCT 29.8*  --  28.4* 28.0* 26.7* 27.2*  MCV 82.3  --  82.1 81.4 82.4 81.9  PLT 449*  --  521* 466* 433* 885*   Basic Metabolic Panel:  Recent Labs Lab 05/05/16 0710 05/06/16 0452 05/07/16 0430 05/08/16 0442 05/09/16 0528  NA 131* 131* 133* 132* 132*  K 3.6 4.0 3.9 3.8 4.4  CL 91* 93* 94* 95* 96*  CO2 33* '29 30 29 '$ 32  GLUCOSE 105* 106* 95 100* 98  BUN 17 22* '18 17 13  '$ CREATININE 1.09 1.14 1.12 1.03 1.03  CALCIUM 8.4* 8.9 8.6* 8.5* 8.4*   GFR: Estimated Creatinine Clearance: 69 mL/min (by C-G formula based on SCr of 1.03 mg/dL). Liver Function Tests: No results for input(s): AST, ALT, ALKPHOS, BILITOT, PROT, ALBUMIN in the last 168 hours. No results for input(s): LIPASE, AMYLASE in the last 168 hours. No results for input(s): AMMONIA in the last 168 hours. Coagulation Profile:  Recent Labs Lab 05/06/16 0452  INR 1.21   Cardiac Enzymes:  Recent Labs Lab 05/04/16 1900 05/05/16 0114 05/05/16 0710  TROPONINI <0.03 <0.03 <0.03   BNP (last 3 results) No results for input(s): PROBNP in the last 8760 hours. HbA1C: No results for input(s): HGBA1C in the last 72 hours. CBG: No results for input(s): GLUCAP in the last 168 hours. Lipid Profile: No results for input(s): CHOL, HDL, LDLCALC, TRIG, CHOLHDL, LDLDIRECT in the last 72 hours. Thyroid Function Tests: No results for input(s): TSH, T4TOTAL, FREET4, T3FREE, THYROIDAB in the last 72 hours. Anemia Panel: No results for input(s): VITAMINB12, FOLATE, FERRITIN, TIBC, IRON, RETICCTPCT in the last 72 hours. Urine analysis:    Component Value Date/Time   BILIRUBINUR Neg 12/16/2013 1328   PROTEINUR Trace 12/16/2013 1328   UROBILINOGEN 0.2 12/16/2013 1328   NITRITE Neg 12/16/2013 1328  LEUKOCYTESUR Negative 12/16/2013 1328     Mitzy Naron M.D. [facility] 05/09/2016, 11:25 AM  Pager: [phone] Between 7am to 7pm - call Pager - [phone]  After 7pm go to [email or web address] - password  [email or web address]  Call night coverage person covering after 7pm

## 2016-05-09 NOTE — Progress Notes (Signed)
      Progress Note   Subjective  Patient was not able to tolerate golytely prep overnight, states he continually vomited it up and refused further prep. No overt symptoms of GI bleeding. Otherwise feels at baseline.    Objective   Vital signs in last 24 hours: Temp:  [97.4 F (36.3 C)-98 F (36.7 C)] 97.4 F (36.3 C) (03/05 0604) Pulse Rate:  [45-106] 99 (03/05 0604) Resp:  [18-20] 18 (03/05 0604) BP: (121-136)/(54-79) 129/66 (03/05 0604) SpO2:  [95 %-98 %] 97 % (03/05 0604) Weight:  [199 lb 12.8 oz (90.6 kg)] 199 lb 12.8 oz (90.6 kg) (03/05 0713) Last BM Date: 05/09/16 General:    white male in NAD Heart:  Irregularly irregular Lungs: Respirations even and unlabored,  Abdomen:  Soft,protuberant,  nontender  Normal bowel sounds. Extremities:  (+) 1-2 LE edema. Neurologic:  Alert and oriented,  grossly normal neurologically. Psych:  Cooperative. Normal mood and affect.  Intake/Output from previous day: 03/04 0701 - 03/05 0700 In: 938.3 [P.O.:410; I.V.:128.3; IV Piggyback:400] Out: 1295 [Urine:1295] Intake/Output this shift: No intake/output data recorded.  Lab Results:  Recent Labs  05/07/16 0430 05/08/16 0442  WBC 7.8 9.0  HGB 8.0* 8.2*  HCT 26.7* 27.2*  PLT 433* 412*   BMET  Recent Labs  05/07/16 0430 05/08/16 0442 05/09/16 0528  NA 133* 132* 132*  K 3.9 3.8 4.4  CL 94* 95* 96*  CO2 30 29 32  GLUCOSE 95 100* 98  BUN '18 17 13  '$ CREATININE 1.12 1.03 1.03  CALCIUM 8.6* 8.5* 8.4*   LFT No results for input(s): PROT, ALBUMIN, AST, ALT, ALKPHOS, BILITOT, BILIDIR, IBILI in the last 72 hours. PT/INR No results for input(s): LABPROT, INR in the last 72 hours.  Studies/Results: No results found.     Assessment / Plan:   76 y/o male with atrial fibrillation and CHF, s/p cardiac cath, with anemia, iron deficient, guaiac positive. He has been agreeable to EGD and colonoscopy however did not tolerate Golytely bowel prep. I discussed options with him. He  has tolerated miralax / gatorade bowel prep in the past, will order this for him today in hopes he tolerates this better, tentatively plan for EGD and colonoscopy tomorrow.   Plan: - clear liquid diet today - Miralax / gatorade bowel prep today - can add empiric protonix '40mg'$  PO daily until procedures done - NPO after midnight  - EGD / colonoscopy tomorrow  [place] Cellar, MD [facility] Pager [phone]

## 2016-05-09 NOTE — Care Management Important Message (Signed)
Important Message  Patient Details  Name: Steven Krause MRN: [id number] Date of Birth: [birth year]   Medicare Important Message Given:  Yes    Ariabella Brien 05/09/2016, 1:10 PM

## 2016-05-09 NOTE — Progress Notes (Signed)
Patient educated about importance drinking Golytely however patient is refusing to drink. GI MD Collene Mares made aware. No new orders. Will still keep patient NPO until morning. Will continue to monitor.   Fateh Kindle, RN

## 2016-05-10 ENCOUNTER — Inpatient Hospital Stay (HOSPITAL_COMMUNITY): Payer: Medicare Other | Admitting: Anesthesiology

## 2016-05-10 ENCOUNTER — Encounter (HOSPITAL_COMMUNITY)
Admission: EM | Disposition: A | Discharge status: Home / Self Care | Payer: Self-pay | Source: Home / Self Care | Attending: Internal Medicine

## 2016-05-10 ENCOUNTER — Encounter (HOSPITAL_COMMUNITY): Payer: Self-pay | Admitting: Certified Registered Nurse Anesthetist

## 2016-05-10 DIAGNOSIS — D508 Other iron deficiency anemias: Secondary | ICD-10-CM

## 2016-05-10 HISTORY — PX: COLONOSCOPY: SHX5424

## 2016-05-10 HISTORY — PX: ESOPHAGOGASTRODUODENOSCOPY: SHX5428

## 2016-05-10 HISTORY — PX: GIVENS CAPSULE STUDY: SHX5432

## 2016-05-10 LAB — COMPREHENSIVE METABOLIC PANEL
ALBUMIN: 2.9 g/dL — AB (ref 3.5–5.0)
ALK PHOS: 78 U/L (ref 38–126)
ALT: 27 U/L (ref 17–63)
ANION GAP: 4 — AB (ref 5–15)
AST: 38 U/L (ref 15–41)
BUN: 9 mg/dL (ref 6–20)
CALCIUM: 8.4 mg/dL — AB (ref 8.9–10.3)
CHLORIDE: 93 mmol/L — AB (ref 101–111)
CO2: 31 mmol/L (ref 22–32)
Creatinine, Ser: 0.96 mg/dL (ref 0.61–1.24)
GFR calc Af Amer: >60 mL/min (ref >60)
GFR calc non Af Amer: >60 mL/min (ref >60)
GLUCOSE: 102 mg/dL — AB (ref 65–99)
POTASSIUM: 3.4 mmol/L — AB (ref 3.5–5.1)
SODIUM: 128 mmol/L — AB (ref 135–145)
Total Bilirubin: 0.6 mg/dL (ref 0.3–1.2)
Total Protein: 6.5 g/dL (ref 6.5–8.1)

## 2016-05-10 LAB — CBC
HEMATOCRIT: 27.1 % — AB (ref 39.0–52.0)
HEMOGLOBIN: 8.3 g/dL — AB (ref 13.0–17.0)
MCH: 24.9 pg — AB (ref 26.0–34.0)
MCHC: 30.6 g/dL (ref 30.0–36.0)
MCV: 81.1 fL (ref 78.0–100.0)
Platelets: 395 10*3/uL (ref 150–400)
RBC: 3.34 MIL/uL — ABNORMAL LOW (ref 4.22–5.81)
RDW: 14.5 % (ref 11.5–15.5)
WBC: 7.9 10*3/uL (ref 4.0–10.5)

## 2016-05-10 SURGERY — EGD (ESOPHAGOGASTRODUODENOSCOPY)
Anesthesia: Monitor Anesthesia Care

## 2016-05-10 SURGERY — IMAGING PROCEDURE, GI TRACT, INTRALUMINAL, VIA CAPSULE
Anesthesia: LOCAL

## 2016-05-10 MED ORDER — PROPOFOL 500 MG/50ML IV EMUL
INTRAVENOUS | Status: DC | PRN
  Administered 2016-05-10: 100 ug/kg/min via INTRAVENOUS

## 2016-05-10 MED ORDER — LACTATED RINGERS IV SOLN
INTRAVENOUS | Status: DC
  Administered 2016-05-10: 1000 mL via INTRAVENOUS
  Administered 2016-05-10: 08:00:00 via INTRAVENOUS

## 2016-05-10 SURGICAL SUPPLY — 1 items: TOWEL COTTON PACK 4EA (MISCELLANEOUS) ×4 IMPLANT

## 2016-05-10 NOTE — Progress Notes (Signed)
Small bowel capsule ingested at 1240. Pt verbalize understanding.  Instructions given to floor RN

## 2016-05-10 NOTE — H&P (View-Only) (Signed)
      Progress Note   Subjective  Patient was not able to tolerate golytely prep overnight, states he continually vomited it up and refused further prep. No overt symptoms of GI bleeding. Otherwise feels at baseline.    Objective   Vital signs in last 24 hours: Temp:  [97.4 F (36.3 C)-98 F (36.7 C)] 97.4 F (36.3 C) (03/05 0604) Pulse Rate:  [45-106] 99 (03/05 0604) Resp:  [18-20] 18 (03/05 0604) BP: (121-136)/(54-79) 129/66 (03/05 0604) SpO2:  [95 %-98 %] 97 % (03/05 0604) Weight:  [199 lb 12.8 oz (90.6 kg)] 199 lb 12.8 oz (90.6 kg) (03/05 0713) Last BM Date: 05/09/16 General:    white male in NAD Heart:  Irregularly irregular Lungs: Respirations even and unlabored,  Abdomen:  Soft,protuberant,  nontender  Normal bowel sounds. Extremities:  (+) 1-2 LE edema. Neurologic:  Alert and oriented,  grossly normal neurologically. Psych:  Cooperative. Normal mood and affect.  Intake/Output from previous day: 03/04 0701 - 03/05 0700 In: 938.3 [P.O.:410; I.V.:128.3; IV Piggyback:400] Out: 1295 [Urine:1295] Intake/Output this shift: No intake/output data recorded.  Lab Results:  Recent Labs  05/07/16 0430 05/08/16 0442  WBC 7.8 9.0  HGB 8.0* 8.2*  HCT 26.7* 27.2*  PLT 433* 412*   BMET  Recent Labs  05/07/16 0430 05/08/16 0442 05/09/16 0528  NA 133* 132* 132*  K 3.9 3.8 4.4  CL 94* 95* 96*  CO2 30 29 32  GLUCOSE 95 100* 98  BUN '18 17 13  '$ CREATININE 1.12 1.03 1.03  CALCIUM 8.6* 8.5* 8.4*   LFT No results for input(s): PROT, ALBUMIN, AST, ALT, ALKPHOS, BILITOT, BILIDIR, IBILI in the last 72 hours. PT/INR No results for input(s): LABPROT, INR in the last 72 hours.  Studies/Results: No results found.     Assessment / Plan:   76 y/o male with atrial fibrillation and CHF, s/p cardiac cath, with anemia, iron deficient, guaiac positive. He has been agreeable to EGD and colonoscopy however did not tolerate Golytely bowel prep. I discussed options with him. He  has tolerated miralax / gatorade bowel prep in the past, will order this for him today in hopes he tolerates this better, tentatively plan for EGD and colonoscopy tomorrow.   Plan: - clear liquid diet today - Miralax / gatorade bowel prep today - can add empiric protonix '40mg'$  PO daily until procedures done - NPO after midnight  - EGD / colonoscopy tomorrow  [place] Cellar, MD [facility]enter[facility] Pager [phone]

## 2016-05-10 NOTE — Transfer of Care (Signed)
Immediate Anesthesia Transfer of Care Note  Patient: Steven Krause  Procedure(s) Performed: Procedure(s): ESOPHAGOGASTRODUODENOSCOPY (EGD) (N/A) COLONOSCOPY (N/A)  Patient Location: PACU and Endoscopy Unit  Anesthesia Type:MAC  Level of Consciousness: patient cooperative and responds to stimulation  Airway & Oxygen Therapy: Patient Spontanous Breathing and Patient connected to nasal cannula oxygen  Post-op Assessment: Report given to RN and Post -op Vital signs reviewed and stable  Post vital signs: Reviewed and stable  Last Vitals:  Vitals:   05/10/16 0648 05/10/16 0751  BP: 138/63 (!) 144/68  Pulse: 77 (!) 49  Resp: 18 16  Temp: 36.3 C 36.7 C    Last Pain:  Vitals:   05/10/16 0751  TempSrc: Oral  PainSc:          Complications: No apparent anesthesia complications

## 2016-05-10 NOTE — Progress Notes (Signed)
Patient reports SOB with ambulation. States he uses O2 PRN at home. Refusing O2 at this time. No respiratory distress noted. Will continue to monitor patient.

## 2016-05-10 NOTE — Progress Notes (Signed)
Progress Note  Patient Name: Steven Krause Date of Encounter: 05/10/2016  Primary Cardiologist: Dr. Sallyanne Kuster   Subjective   No chest pain no chest pain. No dyspnea. Post colonoscopy. See below  Inpatient Medications    Scheduled Meds: . azithromycin  500 mg Intravenous Q24H  . cefTRIAXone (ROCEPHIN)  IV  1 g Intravenous Q24H  . ezetimibe  10 mg Oral Daily  . furosemide  40 mg Intravenous Q12H  . iron polysaccharides  150 mg Oral Daily  . pantoprazole  40 mg Oral Q0600  . potassium chloride SA  20 mEq Oral Daily  . sodium chloride flush  3 mL Intravenous Q12H  . sodium chloride flush  3 mL Intravenous Q12H  . tamsulosin  0.4 mg Oral Daily   Continuous Infusions:  PRN Meds: sodium chloride, sodium chloride, acetaminophen, HYDROcodone-acetaminophen, ondansetron (ZOFRAN) IV, sodium chloride flush, sodium chloride flush   Vital Signs    Vitals:   05/10/16 0910 05/10/16 0920 05/10/16 0951 05/10/16 1320  BP: 132/77 (!) 136/59 127/62 116/62  Pulse: (!) 52 65 (!) 51 (!) 43  Resp: '20 20 20 18  '$ Temp:   97.4 F (36.3 C) 97.3 F (36.3 C)  TempSrc:   Oral Oral  SpO2: 100% 95% 96% 95%  Weight:      Height:        Intake/Output Summary (Last 24 hours) at 05/10/16 1353 Last data filed at 05/10/16 1300  Gross per 24 hour  Intake             3697 ml  Output              300 ml  Net             3397 ml   Filed Weights   05/08/16 0634 05/09/16 0713 05/10/16 0648  Weight: 202 lb 14.4 oz (92 kg) 199 lb 12.8 oz (90.6 kg) 195 lb 11.2 oz (88.8 kg)    Telemetry    Atrial fibrillation with a SVR- Personally Reviewed    Physical Exam   GEN: No acute distress.  Dentition in poor repair.  Neck: No JVD Cardiac: irreguarlly irregular, bradycardia no murmurs, rubs, or gallops.  Respiratory: faint bibasilar rales. GI: Soft, nontender, non-distended  MS: 3+ bilateral LEE pitting edema; No deformity. Neuro:  Nonfocal  Psych: Normal affect   Labs    Chemistry  Recent  Labs Lab 05/08/16 0442 05/09/16 0528 05/10/16 0515  NA 132* 132* 128*  K 3.8 4.4 3.4*  CL 95* 96* 93*  CO2 29 32 31  GLUCOSE 100* 98 102*  BUN '17 13 9  '$ CREATININE 1.03 1.03 0.96  CALCIUM 8.5* 8.4* 8.4*  PROT  --   --  6.5  ALBUMIN  --   --  2.9*  AST  --   --  38  ALT  --   --  27  ALKPHOS  --   --  78  BILITOT  --   --  0.6  GFRNONAA >60 >60 >60  GFRAA >60 >60 >60  ANIONGAP 8 4* 4*     Hematology  Recent Labs Lab 05/07/16 0430 05/08/16 0442 05/10/16 0515  WBC 7.8 9.0 7.9  RBC 3.24* 3.32* 3.34*  HGB 8.0* 8.2* 8.3*  HCT 26.7* 27.2* 27.1*  MCV 82.4 81.9 81.1  MCH 24.7* 24.7* 24.9*  MCHC 30.0 30.1 30.6  RDW 15.2 14.9 14.5  PLT 433* 412* 395    Cardiac Enzymes  Recent Labs Lab 05/04/16 1900 05/05/16 0114  05/05/16 0710  TROPONINI <0.03 <0.03 <0.03     Recent Labs Lab 05/04/16 1520  TROPIPOC 0.02     BNP  Recent Labs Lab 05/04/16 1454 05/06/16 0452  BNP 216.0* 251.1*     DDimer   Recent Labs Lab 05/04/16 1454  DDIMER 1.10*     Radiology    No results found.  Cardiac Studies   Procedures   Left Heart Cath and Coronary Angiography  Conclusion     Dist LAD-2 lesion, 30 %stenosed.  Dist LAD-1 lesion, 40 %stenosed.  Inf Sept lesion, 90 %stenosed.  LV end diastolic pressure is mildly elevated.   1. 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  2. Only obstructive disease is in a tiny distal RCA branch. 3. Elevated LVEDP  Plan: medical management.      Patient Profile     76 y.o. male with a PMH significant for rate controlled Afib on xarelto with frequent PVCs, smoker, HTN, anemia, and CHF. He presented to the ED with exertional chest discomfort and dyspnea. Cardiology was consulted for chest pain and possible CHF.   Assessment & Plan    1. Chest pain - EKG without signs of ischemia; troponin x 3 negative - pt has history of HTN, HLD, and smoking and strong family history  of heart disease - pt has had chest pain with exertion that is relieved with rest x 3 days. None overnight.  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 Only obstructive disease is a tiny distal RCA branch. Medical management recommended- however medical therapies limited currently. No ASA currently given anemia + GI w/u for GIB (FOBT +). No BB given issues with bradycardia. Intolerant to statins. On Zetia. He is stable w/o CP. No dyspnea.   2. Acute diastolic CHF, pleural effusion, LE edema - echocardiogram shows EF 60-65%, Grade 2 DD - BNP 251. - he needs additional diuresis, he has 2+ bilateral LEE.  - Increased Lasix to 80 mg IV BID on 3/5. Still positive. ?? - Weight is down 195 from 212!   3. Permanent atrial fibrillation, rate controlled - asymptomatic - last dose of xarelto was 05/04/16 evening. On hold for anemia. GI to let us know when OK to restart.  - not currently on a beta blocker at home 2/2 bradycardia; ventricular rates in the 50s- 60s (occasional 40's)   3. HTN - controlled on current regimen    4. Positive D-Dimer - CTA ruled out PE Venous 2+ scan reveals no evidence of DVT.  5. Anemia - positive FOBT - colonoscopy performed, no clear cause. Capsule  Signed, Candee Furbish, MD  05/10/2016, 1:53 PM

## 2016-05-10 NOTE — Progress Notes (Signed)
EGD and colonoscopy completed without a clear cause for iron deficiency and guaiac positive stools. Given his need for anticoagulation without a clear cause I offered him a capsule endoscopy to clear the small bowel. It appeared he may have a ileocolonic surgical anastomosis vs. Atypical appearing cecum on his colonoscopy, but the ileum was widely patent. The patient denies any prior bowel surgery or bowel obstruction. He has a ventral hernia but no obstructive symptoms at present. I think he is low risk for capsule retention and discussed risks / benefits of capsule with him. Following our discussion he wanted to proceed with it. Please keep him NPO for now, GI nursing will inform him when he can have liquids. Capsule to be performed today with report hopefully back tomorrow.   [place] Cellar, MD [facility] Gastroenter[facility] Pager [phone]

## 2016-05-10 NOTE — Progress Notes (Signed)
Pt is alert and oriented ambulatory, currently in procedure off tele monitor.

## 2016-05-10 NOTE — Progress Notes (Signed)
[facility]                                                                              Patient Demographics  Steven Krause, is a 76 y.o. male, DOB - Sep 28, 1940, VQQ:[id number]  Admit date - 05/04/2016   Admitting Physician Princessa Lesmeister Krystal Eaton, MD  Outpatient Primary MD for the patient is REED, Steven Sidle, DO  Outpatient specialists:   LOS - 6  days    Chief Complaint  Patient presents with  . Shortness of Breath       Brief summary   Patient is a 76 year old male with history of polycythemia, abdominal hernia, hypertension, hypertension presented with shortness of breath and chest tightness since this morning and worsening peripheral edema in the last 2 weeks. History was obtained from the patient. The patient reported that he was in his baseline health this morning when he woke up. He was eating breakfast when he choked up on the eggs around 8 AM and since then he felt that he was not able to catch his breath. Patient also felt pressure-like chest tightness with no radiation. He does have abdominal distention but he attributes it to his abdominal hernia, no abdominal pain. He denies any orthopnea or PND however when he sat up for his examination, he stated he felt better. Patient denied any hematochezia or melena to me however to EDP, he reported intermittent block stools. hemoglobin 9.2. Hemoglobin was 10.2 on 2/12 and 12.9 on 12/25/15, gradually has been trending down.  FOBT positive   Assessment & Plan    Principal Problem:   Acute CHF (congestive heart failure) ([place]): Unknown EF - Patient presenting with exertional dyspnea, chest tightness and worsening peripheral edema in the last 2 weeks, elevated BNP. Previous echo in 2011 - Chest x-ray shows small right pleural effusion with patchy multifocal right lung opacity, nonspecific but suspicious for acute bronchopneumonia - on IV diuresis with Lasix 40 mg q12hrs , Negative balance of 1.4 L - Troponins negative,  d-dimer elevated, CT angiogram of the chest negative for PE however showed irregular 2.3 cm right upper lobe pulmonary nodule suspicious for primary bronchogenic malignancy with bulky right hilar and mediastinal adenopathy.  -  Doppler ultrasound of the lower extremities negative for DVT - Cardiac cath showed marked aneurysmal CAD, RCA large and aneurysmal, only obstructive disease is a tiny distal RCA branch plan medical management - Peripheral edema likely due to hypoalbuminemia  Active Problems: 2.3 cm right upper lobe pulmonary nodule/tumor - CT angiogram of the chest was negative for PE however showed irregular 2.3 cm right upper lobe, the nodule suspicious for primary bronchogenic malignancy with bulky right hilar and mild mediastinal adenopathy - Explained in detail to the patient, discussed with pulmonology, Dr Rogene Houston,  will likely need outpatient PET CT and biopsy, recommended to make follow-up appointment outpatient - follow-up scheduled with Dr. Melvyn Novas on 05/12/16 at 3:30 PM    Chronic Anemia - Unclear etiology, FOBT positive on adm - Anemia panel shows iron deficiency anemia. Colonoscopy in 07/2012 had shown internal hemorrhoids otherwise negative - Xarelto currently held,  - EGD and colonoscopy without any clear cause  for iron deficiency anemia and guaiac positive stools. Gastroenterology recommending capsule endoscopy.     Chronic atrial fibrillation (HCC) - Ventricular response slow in 60s, hence no beta blockers  - Patient is on xarelto, currently on hold secondary to anemia and positive FOBT    Essential hypertension - Currently stable, will place on low-dose  ACEI, Lasix  - Hold Norvasc due to acute CHF, no beta blocker secondary to bradycardia     CAP (community acquired pneumonia): Possibly due to aspiration due to choking on the breakfast  - Chest x-ray shows small right pleural effusion with patchy multifocal right lung opacity, nonspecific but suspicious for acute  bronchopneumonia - for now place on Zithromax, Rocephin, -Urine strep antigen negative, pro-calcitonin <0.1   Code Status: full DVT Prophylaxis:  SCD's Family Communication: Discussed in detail with the patient, all imaging results, lab results explained to the patient  Disposition Plan: Awaiting GI evaluation  Time Spent in minutes   15 minutes  Procedures:  CTA chest Cardiac  Cath   Consultants:   Cardiology GI Pulmonology  Antimicrobials:   IV Zithromax  IV Rocephin   Medications  Scheduled Meds: . azithromycin  500 mg Intravenous Q24H  . cefTRIAXone (ROCEPHIN)  IV  1 g Intravenous Q24H  . ezetimibe  10 mg Oral Daily  . furosemide  40 mg Intravenous Q12H  . iron polysaccharides  150 mg Oral Daily  . pantoprazole  40 mg Oral Q0600  . potassium chloride SA  20 mEq Oral Daily  . sodium chloride flush  3 mL Intravenous Q12H  . sodium chloride flush  3 mL Intravenous Q12H  . tamsulosin  0.4 mg Oral Daily   Continuous Infusions:  PRN Meds:.sodium chloride, sodium chloride, acetaminophen, HYDROcodone-acetaminophen, ondansetron (ZOFRAN) IV, sodium chloride flush, sodium chloride flush   Antibiotics   Anti-infectives    Start     Dose/Rate Route Frequency Ordered Stop   05/05/16 1600  azithromycin (ZITHROMAX) 500 mg in dextrose 5 % 250 mL IVPB     500 mg 250 mL/hr over 60 Minutes Intravenous Every 24 hours 05/04/16 1854     05/05/16 1600  cefTRIAXone (ROCEPHIN) 1 g in dextrose 5 % 50 mL IVPB     1 g 100 mL/hr over 30 Minutes Intravenous Every 24 hours 05/04/16 1854     05/04/16 1545  cefTRIAXone (ROCEPHIN) injection 1 g     1 g Intramuscular  Once 05/04/16 1535 05/04/16 1550   05/04/16 1545  azithromycin (ZITHROMAX) 500 mg in dextrose 5 % 250 mL IVPB     500 mg 250 mL/hr over 60 Minutes Intravenous  Once 05/04/16 1535 05/04/16 1730        Subjective:   Steven Krause was seen and examined today. No GI bleeding while inpatient. Patient doing well hoping  to go home soon. No fevers or chills. Patient denies dizziness, abdominal pain, N/V/D/C, new weakness, numbess, tingling. No acute events overnight.  Still has peripheral edema. No shortness of breath.  Objective:   Vitals:   05/10/16 0900 05/10/16 0910 05/10/16 0920 05/10/16 0951  BP: (!) 127/56 132/77 (!) 136/59 127/62  Pulse: (!) 44 (!) 52 65 (!) 51  Resp: '14 20 20 20  '$ Temp: 97.4 F (36.3 C)   97.4 F (36.3 C)  TempSrc: Oral   Oral  SpO2: 100% 100% 95% 96%  Weight:      Height:        Intake/Output Summary (Last 24 hours) at 05/10/16 1241 Last data  filed at 05/10/16 0850  Gross per 24 hour  Intake             3817 ml  Output              200 ml  Net             3617 ml     Wt Readings from Last 3 Encounters:  05/10/16 88.8 kg (195 lb 11.2 oz)  04/18/16 93.9 kg (207 lb)  12/25/15 88.9 kg (196 lb)     Exam  General: Alert and oriented x 3, NAD  HEENT:    Neck: Supple, no JVD, no masses  Cardiovascular: S1 S2 Clear, RRR  Respiratory: CTAB  Gastrointestinal: Soft, nontender, nondistended, + bowel sounds  Ext: no cyanosis clubbing, 1-2+  edema  Neuro: no new deficits  Skin: No rashes  Psych: Normal affect and demeanor, alert and oriented x3    Data Reviewed:  I have personally reviewed following labs and imaging studies  Micro Results No results found for this or any previous visit (from the past 240 hour(s)).  Radiology Reports Dg Chest 2 View  Result Date: 05/04/2016 CLINICAL DATA:  76 year old male with weakness, dizziness, shortness of breath. Initial encounter. EXAM: CHEST  2 VIEW COMPARISON:  12/16/2013 and earlier. FINDINGS: Mild cardiomegaly is new since 2015. Other mediastinal contours are within normal limits. Visualized tracheal air column is within normal limits. No pneumothorax or pulmonary edema. There is a small right pleural effusion with patchy mostly middle lobe right lung opacity. There is also subtle new right perihilar patchy opacity  (arrows). Mild linear opacity at the left lung base most resembles atelectasis. Chronic abdominal surgical clips. Negative visible bowel gas pattern. No acute osseous abnormality identified. IMPRESSION: 1. Small right pleural effusion with patchy multifocal right lung opacity which is nonspecific but suspicious for acute bronchopneumonia. 2. Mild cardiomegaly since 2015. Electronically Signed   By: Genevie Ann M.D.   On: 05/04/2016 14:44   Ct Angio Chest Pe W Or Wo Contrast  Result Date: 05/04/2016 CLINICAL DATA:  Worsening chest pain for 1 week. EXAM: CT ANGIOGRAPHY CHEST WITH CONTRAST TECHNIQUE: Multidetector CT imaging of the chest was performed using the standard protocol during bolus administration of intravenous contrast. Multiplanar CT image reconstructions and MIPs were obtained to evaluate the vascular anatomy. CONTRAST:  100 cc Isovue 370 IV COMPARISON:  Chest radiograph earlier this day FINDINGS: Cardiovascular: There are no filling defects within the pulmonary arteries to suggest pulmonary embolus. Mild multi chamber cardiomegaly. There is atherosclerosis of the thoracic aorta without aneurysm. Mediastinum/Nodes: Bulky right hilar adenopathy, node measures 3.4 cm. Enlarged lower paratracheal node suspected measuring 13 mm short axis, however motion artifact is present. There is a prominent sub- carinal node measuring 10 mm. No left hilar adenopathy. No definite supraclavicular adenopathy. Peripherally calcified nodule in the right lobe of the thyroid gland measures 15 mm. The esophagus is decompressed. Lungs/Pleura: Central right upper lobe pulmonary nodule measures 2.3 x 1.7 cm with ill-defined margins. There is perihilar ill-defined ground-glass opacity in the upper lobe, however breathing motion in this region, difficult to exclude involvement of superior segment of the right lower lobe. This ground-glass opacity appears multifocal but centered around the central airways. There are a few nodular  opacities in the right lower lobe for example 6 x 8 mm nodule image 86 series 407. Small right lower lobe nodules more inferiorly. Ill-defined density in the anterior most right lower lobe about the costophrenic sulcus. There  is a 2.1 x 2.0 cm soft tissue density abutting the mediastinum in the right middle lobe image 102 series 407. Within the lingula there is nonspecific 1.7 x 2.3 cm nodularity abutting the pleura. Ill-defined subpleural nodularity in the anterior left upper lobe image 72 series 407. There small bilateral pleural effusions. Upper Abdomen: No evidence of adrenal nodule. No definite acute abnormality. Laxity of the anterior abdominal wall with probable ventral abdominal wall hernia is only partially included. Musculoskeletal: There are no acute or suspicious osseous abnormalities. No blastic or destructive lytic lesions. Review of the MIP images confirms the above findings. IMPRESSION: 1. Irregular 2.3 cm right upper lobe pulmonary nodule suspicious for primary bronchogenic malignancy. There is bulky right hilar and mild mediastinal adenopathy. 2. Right upper lobe perihilar ground-glass opacities are nonspecific, a be neoplastic, infectious or inflammatory. 3. Paramediastinal soft tissue densities in the lingula and right middle lobe, left upper and right lower lobe pulmonary nodules are also nonspecific. 4. Small pleural effusions. 5. No pulmonary embolus. These results will be called to the ordering clinician or representative by the Radiologist Assistant, and communication documented in the PACS or zVision Dashboard. Electronically Signed   By: Jeb Levering M.D.   On: 05/04/2016 22:20    Lab Data:  CBC:  Recent Labs Lab 05/04/16 1731 05/05/16 1009 05/06/16 0452 05/07/16 0430 05/08/16 0442 05/10/16 0515  WBC  --  8.8 8.9 7.8 9.0 7.9  NEUTROABS 6.5  --   --   --   --   --   HGB  --  8.8* 8.6* 8.0* 8.2* 8.3*  HCT  --  28.4* 28.0* 26.7* 27.2* 27.1*  MCV  --  82.1 81.4 82.4 81.9  81.1  PLT  --  521* 466* 433* 412* 161   Basic Metabolic Panel:  Recent Labs Lab 05/06/16 0452 05/07/16 0430 05/08/16 0442 05/09/16 0528 05/10/16 0515  NA 131* 133* 132* 132* 128*  K 4.0 3.9 3.8 4.4 3.4*  CL 93* 94* 95* 96* 93*  CO2 '29 30 29 '$ 32 31  GLUCOSE 106* 95 100* 98 102*  BUN 22* '18 17 13 9  '$ CREATININE 1.14 1.12 1.03 1.03 0.96  CALCIUM 8.9 8.6* 8.5* 8.4* 8.4*   GFR: Estimated Creatinine Clearance: 73.3 mL/min (by C-G formula based on SCr of 0.96 mg/dL). Liver Function Tests:  Recent Labs Lab 05/10/16 0515  AST 38  ALT 27  ALKPHOS 78  BILITOT 0.6  PROT 6.5  ALBUMIN 2.9*   No results for input(s): LIPASE, AMYLASE in the last 168 hours. No results for input(s): AMMONIA in the last 168 hours. Coagulation Profile:  Recent Labs Lab 05/06/16 0452  INR 1.21   Cardiac Enzymes:  Recent Labs Lab 05/04/16 1900 05/05/16 0114 05/05/16 0710  TROPONINI <0.03 <0.03 <0.03   BNP (last 3 results) No results for input(s): PROBNP in the last 8760 hours. HbA1C: No results for input(s): HGBA1C in the last 72 hours. CBG: No results for input(s): GLUCAP in the last 168 hours. Lipid Profile: No results for input(s): CHOL, HDL, LDLCALC, TRIG, CHOLHDL, LDLDIRECT in the last 72 hours. Thyroid Function Tests: No results for input(s): TSH, T4TOTAL, FREET4, T3FREE, THYROIDAB in the last 72 hours. Anemia Panel: No results for input(s): VITAMINB12, FOLATE, FERRITIN, TIBC, IRON, RETICCTPCT in the last 72 hours. Urine analysis:    Component Value Date/Time   BILIRUBINUR Neg 12/16/2013 1328   PROTEINUR Trace 12/16/2013 1328   UROBILINOGEN 0.2 12/16/2013 1328   NITRITE Neg 12/16/2013 1328   LEUKOCYTESUR  Negative 12/16/2013 1328     Ahman Dugdale M.D. [facility] 05/10/2016, 12:41 PM  Pager: [phone] Between 7am to 7pm - call Pager - [phone]  After 7pm go to [email or web address] - password [email or web address]  Call night coverage person covering after 7pm

## 2016-05-10 NOTE — Op Note (Signed)
[facility] Patient Name: Steven Krause Procedure Date : 05/10/2016 MRN: [id number] Attending MD: Carlota Raspberry. Armbruster MD, MD Date of Birth: January 29, 1941 CSN: [id number] Age: 76 Admit Type: Inpatient Procedure:                Colonoscopy Indications:              Iron deficiency anemia, in need of anticoagulation                            for cardiovascular purposes Providers:                Carlota Raspberry. Armbruster MD, MD, Cleda Daub, RN,                            Cherylynn Ridges, Technician Referring MD:              Medicines:                Monitored Anesthesia Care Complications:            No immediate complications. Estimated blood loss:                            None. Estimated Blood Loss:     Estimated blood loss: none. Procedure:                Pre-Anesthesia Assessment:                           - Prior to the procedure, a History and Physical                            was performed, and patient medications and                            allergies were reviewed. The patient's tolerance of                            previous anesthesia was also reviewed. The risks                            and benefits of the procedure and the sedation                            options and risks were discussed with the patient.                            All questions were answered, and informed consent                            was obtained. Prior Anticoagulants: The patient has                            taken Xarelto (rivaroxaban), last dose was 8 days                            prior  to procedure. ASA Grade Assessment: III - A                            patient with severe systemic disease. After                            reviewing the risks and benefits, the patient was                            deemed in satisfactory condition to undergo the                            procedure.                           After obtaining informed consent, the colonoscope                   was passed under direct vision. Throughout the                            procedure, the patient's blood pressure, pulse, and                            oxygen saturations were monitored continuously. The                            [id number] ([id number]) scope was introduced through                            the anus and advanced to the the terminal ileum.                            The colonoscopy was performed without difficulty.                            The patient tolerated the procedure well. The                            quality of the bowel preparation was fair. The                            terminal ileum and the rectum were photographed. Scope In: 8:28:30 AM Scope Out: 8:49:25 AM Scope Withdrawal Time: 0 hours 15 minutes 28 seconds  Total Procedure Duration: 0 hours 20 minutes 55 seconds  Findings:      The perianal and digital rectal examinations were normal.      The terminal ileum appeared normal.      A large amount of liquid stool was found in the entire colon, making       visualization difficult. Several minutes were spent to lavage the colon       using copious amounts of sterile water, resulting in clearance with       adequate visualization.      Internal hemorrhoids were found during retroflexion. The hemorrhoids       were moderate.  There appeared to be a surgical anastomosis in the right colon versus an       atypical appearance of the cecum. Appendiceal orifice was not visualized       nor was typical IC valve, I suspect there is a right sided ileocolonic       anastomosis. The exam was otherwise without abnormality. Impression:               - Preparation of the colon was fair leading to time                            spent lavaging the colon with adequate views                            obtained                           - The examined portion of the ileum was normal.                           - Suspect right sided ileocolonic  anastomosis                            versus atypical appearing cecum                           - Internal hemorrhoids.                           - The examination was otherwise normal.                           No pathology noted on this exam to account for the                            patient's anemia Moderate Sedation:      No moderate sedation, case performed with MAC Recommendation:           - Return patient to hospital ward for ongoing care.                           - I will discuss surgical history with the patient                           - Will consider capsule endoscopy to clear the                            small bowel if no contraindications                           - Continue present medications.                           - IV iron infusion to help with anemia                           - Diet  pending decision for possible capsule study Procedure Code(s):        --- Professional ---                           [phone], Colonoscopy, flexible; diagnostic, including                            collection of specimen(s) by brushing or washing,                            when performed (separate procedure) Diagnosis Code(s):        --- Professional ---                           K64.8, Other hemorrhoids                           D50.9, Iron deficiency anemia, unspecified CPT copyright 2016 American Medical Association. All rights reserved. The codes documented in this report are preliminary and upon coder review may  be revised to meet current compliance requirements. Remo Lipps P. Armbruster MD, MD 05/10/2016 9:10:43 AM This report has been signed electronically. Number of Addenda: 0

## 2016-05-10 NOTE — Op Note (Signed)
[facility] Patient Name: Steven Krause Procedure Date : 05/10/2016 MRN: [id number] Attending MD: Carlota Raspberry. Octavia Velador MD, MD Date of Birth: 1940/11/17 CSN: [id number] Age: 76 Admit Type: Inpatient Procedure:                Upper GI endoscopy Indications:              Iron deficiency anemia Providers:                Remo Lipps P. Cheryle Dark MD, MD, Cleda Daub, RN,                            Cherylynn Ridges, Technician, Judeth Cornfield, CRNA Referring MD:              Medicines:                Monitored Anesthesia Care Complications:            No immediate complications. Estimated blood loss:                            Minimal. Estimated Blood Loss:     Estimated blood loss was minimal. Procedure:                Pre-Anesthesia Assessment:                           - Prior to the procedure, a History and Physical                            was performed, and patient medications and                            allergies were reviewed. The patient's tolerance of                            previous anesthesia was also reviewed. The risks                            and benefits of the procedure and the sedation                            options and risks were discussed with the patient.                            All questions were answered, and informed consent                            was obtained. Prior Anticoagulants: The patient has                            taken Xarelto (rivaroxaban), last dose was 8 days                            prior to procedure. ASA Grade Assessment: III - A  patient with severe systemic disease. After                            reviewing the risks and benefits, the patient was                            deemed in satisfactory condition to undergo the                            procedure.                           After obtaining informed consent, the endoscope was                            passed under direct  vision. Throughout the                            procedure, the patient's blood pressure, pulse, and                            oxygen saturations were monitored continuously. The                            [id number] ([id number]) scope was introduced through the                            mouth, and advanced to the second part of duodenum.                            The upper GI endoscopy was accomplished without                            difficulty. The patient tolerated the procedure                            well. Scope In: Scope Out: Findings:      Esophagogastric landmarks were identified: the Z-line was found at 36       cm, the gastroesophageal junction was found at 36 cm and the upper       extent of the gastric folds was found at 39 cm from the incisors.      A 3 cm hiatal hernia was present.      The exam of the esophagus was otherwise normal.      A single 4 mm sessile polyp was found in the gastric fundus. The polyp       was removed with a cold biopsy forceps. Resection and retrieval were       complete.      Patchy mild inflammation characterized by erythema and friability was       found in the gastric antrum. Biopsies were taken with a cold forceps for       Helicobacter pylori testing.      The exam of the stomach was otherwise normal.      The duodenal bulb and second portion of the duodenum were normal. Impression:               -  Esophagogastric landmarks identified.                           - 3 cm hiatal hernia.                           - Normal esophagus otherwise                           - A single gastric polyp. Resected and retrieved.                           - Gastritis. Biopsied.                           - Normal duodenal bulb and second portion of the                            duodenum. Moderate Sedation:      No moderate sedation, case performed with MAC Recommendation:           - Return patient to hospital ward for ongoing care.                            - Continue present medications including protonix                            7m daily                           - Await pathology results.                           - H pylori can be associated with low iron levels                            but would not be expected to cause the level of                            this patient's anemia                           - Given need for anticoagulation we may consider                            capsule endoscopy to further clear small bowel                            given no evidence of pathology to cause anemia on                            EGD and colonoscopy Procedure Code(s):        --- Professional ---                           [phone] Esophagogastroduodenoscopy, flexible,  transoral; with biopsy, single or multiple Diagnosis Code(s):        --- Professional ---                           K44.9, Diaphragmatic hernia without obstruction or                            gangrene                           K31.7, Polyp of stomach and duodenum                           K29.70, Gastritis, unspecified, without bleeding                           D50.9, Iron deficiency anemia, unspecified CPT copyright 2016 American Medical Association. All rights reserved. The codes documented in this report are preliminary and upon coder review may  be revised to meet current compliance requirements. Remo Lipps P. Fayette Hamada MD, MD 05/10/2016 9:15:19 AM This report has been signed electronically. Number of Addenda: 0

## 2016-05-10 NOTE — Anesthesia Preprocedure Evaluation (Addendum)
Anesthesia Evaluation  Patient identified by MRN, date of birth, ID band Patient awake    Reviewed: Allergy & Precautions, NPO status , Patient's Chart, lab work & pertinent test results  Airway Mallampati: II  TM Distance: >3 FB Neck ROM: Full    Dental  (+) Missing, Poor Dentition, Chipped, Dental Advisory Given   Pulmonary neg pulmonary ROS, former smoker,    Pulmonary exam normal breath sounds clear to auscultation       Cardiovascular hypertension, +CHF  + dysrhythmias Atrial Fibrillation  Rhythm:Irregular Rate:Normal  Left ventricle: The cavity size was normal. Systolic function was   normal. The estimated ejection fraction was in the range of 60%   to 65%. Wall motion was normal; there were no regional wall   motion abnormalities. Features are consistent with a pseudonormal   left ventricular filling pattern, with concomitant abnormal   relaxation and increased filling pressure (grade 2 diastolic   dysfunction). - Aortic valve: Trileaflet; mildly thickened, mildly calcified   leaflets. Valve mobility was restricted. There was very mild   stenosis. Peak velocity (S): 248 cm/s. Mean gradient (S): 10 mm   Hg. - Left atrium: The atrium was mildly dilated. - Right atrium: The atrium was mildly dilated. - Pulmonary arteries: Systolic pressure was moderately increased.   PA peak pressure: 51 mm Hg (S).   Neuro/Psych negative neurological ROS  negative psych ROS   GI/Hepatic negative GI ROS, Neg liver ROS,   Endo/Other  negative endocrine ROS  Renal/GU negative Renal ROS  negative genitourinary   Musculoskeletal negative musculoskeletal ROS (+)   Abdominal   Peds negative pediatric ROS (+)  Hematology  (+) anemia ,   Anesthesia Other Findings   Reproductive/Obstetrics negative OB ROS                           Anesthesia Physical Anesthesia Plan  ASA: III  Anesthesia Plan: MAC    Post-op Pain Management:    Induction: Intravenous  Airway Management Planned: Nasal Cannula  Additional Equipment:   Intra-op Plan:   Post-operative Plan:   Informed Consent: I have reviewed the patients History and Physical, chart, labs and discussed the procedure including the risks, benefits and alternatives for the proposed anesthesia with the patient or authorized representative who has indicated his/her understanding and acceptance.   Dental advisory given  Plan Discussed with: CRNA and Surgeon  Anesthesia Plan Comments:         Anesthesia Quick Evaluation

## 2016-05-10 NOTE — Interval H&P Note (Signed)
History and Physical Interval Note:  05/10/2016 7:56 AM  Steven Krause  has presented today for surgery, with the diagnosis of ANEMIA WITH GUAIAC POSITIVE STOOLS  The various methods of treatment have been discussed with the patient and family. After consideration of risks, benefits and other options for treatment, the patient has consented to  Procedure(s): ESOPHAGOGASTRODUODENOSCOPY (EGD) (N/A) COLONOSCOPY (N/A) as a surgical intervention .  The patient's history has been reviewed, patient examined, no change in status, stable for surgery.  I have reviewed the patient's chart and labs.  Questions were answered to the patient's satisfaction.     Renelda Loma Lola Lofaro

## 2016-05-10 NOTE — Progress Notes (Signed)
Pt slowly eating a clear liquid per GI instruction sheet. with a strong cough, chicken broth and ginger ale.

## 2016-05-11 ENCOUNTER — Telehealth: Payer: Self-pay | Admitting: Internal Medicine

## 2016-05-11 DIAGNOSIS — R0789 Other chest pain: Secondary | ICD-10-CM

## 2016-05-11 MED ORDER — SODIUM CHLORIDE 0.9 % IV SOLN
500.0000 mg | Freq: Once | INTRAVENOUS | Status: AC
  Administered 2016-05-11: 500 mg via INTRAVENOUS
  Filled 2016-05-11 (×2): qty 10

## 2016-05-11 MED ORDER — APIXABAN 5 MG PO TABS
5.0000 mg | ORAL_TABLET | Freq: Two times a day (BID) | ORAL | Status: DC
  Administered 2016-05-11 – 2016-05-12 (×2): 5 mg via ORAL
  Filled 2016-05-11 (×2): qty 1

## 2016-05-11 MED ORDER — SODIUM CHLORIDE 0.9 % IV SOLN
25.0000 mg | Freq: Once | INTRAVENOUS | Status: AC
  Administered 2016-05-11: 25 mg via INTRAVENOUS
  Filled 2016-05-11: qty 0.5

## 2016-05-11 MED ORDER — FUROSEMIDE 10 MG/ML IJ SOLN
80.0000 mg | Freq: Two times a day (BID) | INTRAMUSCULAR | Status: DC
  Administered 2016-05-11 – 2016-05-12 (×3): 80 mg via INTRAVENOUS
  Filled 2016-05-11 (×3): qty 8

## 2016-05-11 MED ORDER — IRON DEXTRAN 50 MG/ML IJ SOLN: 500.0000 mg | Freq: Once | INTRAMUSCULAR | Status: DC

## 2016-05-11 NOTE — Progress Notes (Signed)
ANTICOAGULATION CONSULT NOTE - Initial Consult  Pharmacy Consult for Eliquis Indication: atrial fibrillation  Allergies  Allergen Reactions  . Statins     unknown   Patient Measurements: Height: '5\' 9"'$  (175.3 cm) Weight: 194 lb 4.8 oz (88.1 kg) (scale c) IBW/kg (Calculated) : 70.7  Vital Signs: Temp: 97.4 F (36.3 C) (03/07 1206) Temp Source: Oral (03/07 1206) BP: 127/80 (03/07 1206) Pulse Rate: 61 (03/07 1206)  Labs:  Recent Labs  05/09/16 0528 05/10/16 0515  HGB  --  8.3*  HCT  --  27.1*  PLT  --  395  CREATININE 1.03 0.96   Estimated Creatinine Clearance: 73.1 mL/min (by C-G formula based on SCr of 0.96 mg/dL).  Medical History: Past Medical History:  Diagnosis Date  . Abdominal pain, generalized   . Acquired polycythemia 01/05/2011  . Allergy   . Diarrhea   . Dizziness and giddiness   . Hernia of unspecified site of abdominal cavity without mention of obstruction or gangrene   . Hypertension   . Hypopotassemia   . Iron excess 01/05/2011  . Memory loss   . Other specified cardiac dysrhythmias(427.89)   . 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 Polycythemia, secondary   . Systemic hypertension   . Tobacco use disorder   . Unspecified disorder of kidney and ureter   . Unspecified disorder of skin and subcutaneous tissue   . Unspecified hearing loss    Medications:  Prescriptions Prior to Admission  Medication Sig Dispense Refill Last Dose  . amLODipine (NORVASC) 10 MG tablet TAKE 1 TABLET BY MOUTH EVERY DAY 30 tablet 5 05/04/2016 at Unknown time  . furosemide (LASIX) 40 MG tablet TAKE 1 TABLET IN THE MORNING AND TAKE 1 TABLET IN THE EVENING 60 tablet 5 05/04/2016 at Unknown time  . HYDROcodone-acetaminophen (NORCO) 5-325 MG tablet Take 1 tablet by mouth every 6 (six) hours as needed for moderate pain. 30 tablet 0 05/04/2016 at Unknown time  . potassium chloride SA (K-DUR,KLOR-CON) 20 MEQ tablet Take 1 tablet (20 mEq total) by mouth daily. 90 tablet 3 05/04/2016 at  Unknown time  . spironolactone (ALDACTONE) 25 MG tablet Take 1 tablet (25 mg total) by mouth daily. 30 tablet 5 05/04/2016 at Unknown time  . tamsulosin (FLOMAX) 0.4 MG CAPS capsule TAKE ONE CAPSULE BY MOUTH ONCE DAILY 30 capsule 5 05/04/2016 at Unknown time  . XARELTO 20 MG TABS tablet TAKE 1 TABLET BY MOUTH EVERY DAY WITH SUPPER 30 tablet 5 05/03/2016 at 1800  . ZETIA 10 MG tablet TAKE 1 TABLET BY MOUTH EVERY DAY 30 tablet 0 05/04/2016 at Unknown time   Assessment: 76 year old male with complex cardiac history including atrial fibrillation and heart failure. Patient taking Xarelto prior to admission for atrial fibrillation, has been on hold for FOBT positive. Patient having dark stools in addition to HgB 8.3. Pharmacy has been asked to restart anticoagulation with Eliquis. Scr 0.96 and weight 88kg. No bleeding reported at this time.   Plan:  Eliquis '5mg'$  BID Monitor for s/sx of bleeding  Georga Bora, PharmD Clinical Pharmacist Pager: [phone] 05/11/2016 6:45 PM

## 2016-05-11 NOTE — Telephone Encounter (Signed)
Paged Dr. Salome Arnt. Will await call back.

## 2016-05-11 NOTE — Progress Notes (Signed)
Progress Note  Patient Name: Steven Krause Date of Encounter: 05/11/2016  Primary Cardiologist: Dr. Sallyanne Kuster   Subjective   No chest pain no chest pain. No dyspnea. Post colonoscopy. See below. Edema improving.   Inpatient Medications    Scheduled Meds: . azithromycin  500 mg Intravenous Q24H  . cefTRIAXone (ROCEPHIN)  IV  1 g Intravenous Q24H  . ezetimibe  10 mg Oral Daily  . furosemide  40 mg Intravenous Q12H  . iron polysaccharides  150 mg Oral Daily  . pantoprazole  40 mg Oral Q0600  . potassium chloride SA  20 mEq Oral Daily  . sodium chloride flush  3 mL Intravenous Q12H  . sodium chloride flush  3 mL Intravenous Q12H  . tamsulosin  0.4 mg Oral Daily   Continuous Infusions:  PRN Meds: sodium chloride, sodium chloride, acetaminophen, HYDROcodone-acetaminophen, ondansetron (ZOFRAN) IV, sodium chloride flush, sodium chloride flush   Vital Signs    Vitals:   05/10/16 0951 05/10/16 1320 05/10/16 2003 05/11/16 0501  BP: 127/62 116/62 134/68 127/61  Pulse: (!) 51 (!) 43 (!) 43 (!) 43  Resp: '20 18 18 18  '$ Temp: 97.4 F (36.3 C) 97.3 F (36.3 C) 98.1 F (36.7 C) 98.1 F (36.7 C)  TempSrc: Oral Oral Oral Oral  SpO2: 96% 95% 95% 95%  Weight:    194 lb 4.8 oz (88.1 kg)  Height:        Intake/Output Summary (Last 24 hours) at 05/11/16 0904 Last data filed at 05/10/16 1700  Gross per 24 hour  Intake              420 ml  Output              200 ml  Net              220 ml   Filed Weights   05/09/16 0713 05/10/16 0648 05/11/16 0501  Weight: 199 lb 12.8 oz (90.6 kg) 195 lb 11.2 oz (88.8 kg) 194 lb 4.8 oz (88.1 kg)    Telemetry    Atrial fibrillation rate cont- Personally Reviewed    Physical Exam   GEN: No acute distress.  Dentition in poor repair.  Neck: No JVD Cardiac: irreguarlly irregular, bradycardia no murmurs, rubs, or gallops.  Respiratory: faint bibasilar rales. GI: Soft, nontender, non-distended  MS: 2+ bilateral LEE pitting edema, improving;  No deformity. Neuro:  Nonfocal  Psych: Normal affect   Labs    Chemistry  Recent Labs Lab 05/08/16 0442 05/09/16 0528 05/10/16 0515  NA 132* 132* 128*  K 3.8 4.4 3.4*  CL 95* 96* 93*  CO2 29 32 31  GLUCOSE 100* 98 102*  BUN '17 13 9  '$ CREATININE 1.03 1.03 0.96  CALCIUM 8.5* 8.4* 8.4*  PROT  --   --  6.5  ALBUMIN  --   --  2.9*  AST  --   --  38  ALT  --   --  27  ALKPHOS  --   --  78  BILITOT  --   --  0.6  GFRNONAA >60 >60 >60  GFRAA >60 >60 >60  ANIONGAP 8 4* 4*     Hematology  Recent Labs Lab 05/07/16 0430 05/08/16 0442 05/10/16 0515  WBC 7.8 9.0 7.9  RBC 3.24* 3.32* 3.34*  HGB 8.0* 8.2* 8.3*  HCT 26.7* 27.2* 27.1*  MCV 82.4 81.9 81.1  MCH 24.7* 24.7* 24.9*  MCHC 30.0 30.1 30.6  RDW 15.2 14.9 14.5  PLT  433* 412* 395    Cardiac Enzymes  Recent Labs Lab 05/04/16 1900 05/05/16 0114 05/05/16 0710  TROPONINI <0.03 <0.03 <0.03     Recent Labs Lab 05/04/16 1520  TROPIPOC 0.02     BNP  Recent Labs Lab 05/04/16 1454 05/06/16 0452  BNP 216.0* 251.1*     DDimer   Recent Labs Lab 05/04/16 1454  DDIMER 1.10*     Radiology    No results found.  Cardiac Studies   Procedures   Left Heart Cath and Coronary Angiography  Conclusion     Dist LAD-2 lesion, 30 %stenosed.  Dist LAD-1 lesion, 40 %stenosed.  Inf Sept lesion, 90 %stenosed.  LV end diastolic pressure is mildly elevated.   1. 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  2. Only obstructive disease is in a tiny distal RCA branch. 3. Elevated LVEDP  Plan: medical management.      Patient Profile     76 y.o. male with a PMH significant for rate controlled Afib on xarelto with frequent PVCs, smoker, HTN, anemia, and CHF. He presented to the ED with exertional chest discomfort and dyspnea. Cardiology was consulted for chest pain and possible CHF.   Assessment & Plan    1. Chest pain - EKG without signs of ischemia;  troponin x 3 negative - pt has history of HTN, HLD, and smoking and strong family history of heart disease - pt has had chest pain with exertion that is relieved with rest x 3 days. Improved.  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 Only obstructive disease is a tiny distal RCA branch. Medical management recommended- however medical therapies limited currently. No ASA currently given anemia + GI w/u for GIB (FOBT +). No BB given issues with bradycardia. Intolerant to statins. On Zetia. He is stable w/o CP. No dyspnea.   2. Acute diastolic CHF, pleural effusion, LE edema - echocardiogram shows EF 60-65%, Grade 2 DD - BNP 251. - continue diuresis, he has 2+ bilateral LEE.  - Increased Lasix to 80 mg IV BID on 3/5.  - Weight is down 194 from 212! - Creat 0.96 - Would continue with IV diuresis- in fact I will increase to 80 BID.    3. Permanent atrial fibrillation, rate controlled - asymptomatic - last dose of xarelto was 05/04/16 evening. On hold for anemia. GI to let us know when OK to restart.  - not currently on a beta blocker at home 2/2 bradycardia; ventricular rates in the 50s- 60s (occasional 40's)   3. HTN - controlled on current regimen    4. Positive D-Dimer - CTA ruled out PE Venous 2+ scan reveals no evidence of DVT.  5. Anemia - positive FOBT - colonoscopy performed, no clear cause. Capsule done 3/6  Signed, Candee Furbish, MD  05/11/2016, 9:04 AM

## 2016-05-11 NOTE — Progress Notes (Signed)
[facility]                                                                              Patient Demographics  Steven Krause, is a 76 y.o. male, DOB - March 09, 1940, GXQ:[id number]  Admit date - 05/04/2016   Admitting Physician Eleaner Dibartolo Krystal Eaton, MD  Outpatient Primary MD for the patient is REED, [name], DO  Outpatient specialists:   LOS - 7  days    Chief Complaint  Patient presents with  . Shortness of Breath       Brief summary   Patient is a 76 year old male with history of polycythemia, abdominal hernia, hypertension, hypertension presented with shortness of breath and chest tightness since this morning and worsening peripheral edema in the last 2 weeks. History was obtained from the patient. The patient reported that he was in his baseline health this morning when he woke up. He was eating breakfast when he choked up on the eggs around 8 AM and since then he felt that he was not able to catch his breath. Patient also felt pressure-like chest tightness with no radiation. He does have abdominal distention but he attributes it to his abdominal hernia, no abdominal pain. He denies any orthopnea or PND however when he sat up for his examination, he stated he felt better. Patient denied any hematochezia or melena to me however to EDP, he reported intermittent block stools. hemoglobin 9.2. Hemoglobin was 10.2 on 2/12 and 12.9 on 12/25/15, gradually has been trending down.  FOBT positive   Assessment & Plan    Principal Problem:   Acute CHF (congestive heart failure) ([place]): Unknown EF - Patient presenting with exertional dyspnea, chest tightness and worsening peripheral edema in the last 2 weeks, elevated BNP. Previous echo in 2011 - Chest x-ray shows small right pleural effusion with patchy multifocal right lung opacity, nonspecific but suspicious for acute bronchopneumonia - Troponins negative, d-dimer elevated, CT angiogram of the chest negative for PE however showed  irregular 2.3 cm right upper lobe pulmonary nodule suspicious for primary bronchogenic malignancy with bulky right hilar and mediastinal adenopathy.  -  Doppler ultrasound of the lower extremities negative for DVT - Cardiac cath showed marked aneurysmal CAD, RCA large and aneurysmal, only obstructive disease is a tiny distal RCA branch plan medical management - Peripheral edema likely due to hypoalbuminemia. Cardiology following, placed him on aggressive IV diuresis, increase to 80 mg IV q12hrs.   Active Problems: 2.3 cm right upper lobe pulmonary nodule/tumor - CT angiogram of the chest was negative for PE however showed irregular 2.3 cm right upper lobe, the nodule suspicious for primary bronchogenic malignancy with bulky right hilar and mild mediastinal adenopathy - Explained in detail to the patient, discussed with pulmonology, Dr Rogene Houston,  will likely need outpatient PET CT and biopsy, recommended to make follow-up appointment outpatient - follow-up scheduled with Dr. Melvyn Novas on 05/12/16 at 3:30 PM, will move to next week    Chronic Anemia - Unclear etiology, FOBT positive on adm - Anemia panel shows iron deficiency anemia. Colonoscopy in 07/2012 had shown internal hemorrhoids otherwise negative - Xarelto currently held,  - EGD and  colonoscopy without any clear cause for iron deficiency anemia and guaiac positive stools. Gastroenterology recommending capsule endoscopy.     Chronic atrial fibrillation (HCC) - Ventricular response slow in 60s, hence no beta blockers  - Patient is on xarelto, currently on hold secondary to anemia and positive FOBT    Essential hypertension - Currently stable, will place on low-dose  ACEI, Lasix  - Hold Norvasc due to acute CHF, no beta blocker secondary to bradycardia     CAP (community acquired pneumonia): Possibly due to aspiration due to choking on the breakfast  - Chest x-ray shows small right pleural effusion with patchy multifocal right lung opacity,  nonspecific but suspicious for acute bronchopneumonia - for now place on Zithromax, Rocephin, -Urine strep antigen negative, pro-calcitonin <0.1   Code Status: full DVT Prophylaxis:  SCD's Family Communication: Discussed in detail with the patient, all imaging results, lab results explained to the patient  Disposition Plan: Awaiting GI evaluation and when cleared by cardiology  Time Spent in minutes   15 minutes  Procedures:  CTA chest Cardiac  Cath   Consultants:   Cardiology GI Pulmonology  Antimicrobials:   IV Zithromax  IV Rocephin   Medications  Scheduled Meds: . azithromycin  500 mg Intravenous Q24H  . cefTRIAXone (ROCEPHIN)  IV  1 g Intravenous Q24H  . ezetimibe  10 mg Oral Daily  . furosemide  80 mg Intravenous BID  . iron polysaccharides  150 mg Oral Daily  . pantoprazole  40 mg Oral Q0600  . potassium chloride SA  20 mEq Oral Daily  . sodium chloride flush  3 mL Intravenous Q12H  . sodium chloride flush  3 mL Intravenous Q12H  . tamsulosin  0.4 mg Oral Daily   Continuous Infusions:  PRN Meds:.sodium chloride, sodium chloride, acetaminophen, HYDROcodone-acetaminophen, ondansetron (ZOFRAN) IV, sodium chloride flush, sodium chloride flush   Antibiotics   Anti-infectives    Start     Dose/Rate Route Frequency Ordered Stop   05/05/16 1600  azithromycin (ZITHROMAX) 500 mg in dextrose 5 % 250 mL IVPB     500 mg 250 mL/hr over 60 Minutes Intravenous Every 24 hours 05/04/16 1854     05/05/16 1600  cefTRIAXone (ROCEPHIN) 1 g in dextrose 5 % 50 mL IVPB     1 g 100 mL/hr over 30 Minutes Intravenous Every 24 hours 05/04/16 1854     05/04/16 1545  cefTRIAXone (ROCEPHIN) injection 1 g     1 g Intramuscular  Once 05/04/16 1535 05/04/16 1550   05/04/16 1545  azithromycin (ZITHROMAX) 500 mg in dextrose 5 % 250 mL IVPB     500 mg 250 mL/hr over 60 Minutes Intravenous  Once 05/04/16 1535 05/04/16 1730        Subjective:   Steven Krause was seen and  examined today. No GI bleeding while inpatient. Legs are still swollen.  Patient is hoping to go home by tomorrow. No fevers or chills. Patient denies dizziness, abdominal pain, N/V/D/C, new weakness, numbess, tingling. No acute events overnight.   Objective:   Vitals:   05/10/16 1320 05/10/16 2003 05/11/16 0501 05/11/16 1206  BP: 116/62 134/68 127/61 127/80  Pulse: (!) 43 (!) 43 (!) 43 61  Resp: '18 18 18 20  '$ Temp: 97.3 F (36.3 C) 98.1 F (36.7 C) 98.1 F (36.7 C) 97.4 F (36.3 C)  TempSrc: Oral Oral Oral Oral  SpO2: 95% 95% 95% 99%  Weight:   88.1 kg (194 lb 4.8 oz)   Height:  Intake/Output Summary (Last 24 hours) at 05/11/16 1301 Last data filed at 05/11/16 1100  Gross per 24 hour  Intake              426 ml  Output              102 ml  Net              324 ml     Wt Readings from Last 3 Encounters:  05/11/16 88.1 kg (194 lb 4.8 oz)  04/18/16 93.9 kg (207 lb)  12/25/15 88.9 kg (196 lb)     Exam  General: Alert and oriented x 3, NAD  HEENT:    Neck: Supple, no JVD, no masses  Cardiovascular: S1 S2 Clear, RRR  Respiratory: CTAB  Gastrointestinal: Soft, nontender, nondistended, + bowel sounds  Ext: no cyanosis clubbing, 1-2+  edema  Neuro: no new deficits  Skin: No rashes  Psych: Normal affect and demeanor, alert and oriented x3    Data Reviewed:  I have personally reviewed following labs and imaging studies  Micro Results No results found for this or any previous visit (from the past 240 hour(s)).  Radiology Reports Dg Chest 2 View  Result Date: 05/04/2016 CLINICAL DATA:  76 year old male with weakness, dizziness, shortness of breath. Initial encounter. EXAM: CHEST  2 VIEW COMPARISON:  12/16/2013 and earlier. FINDINGS: Mild cardiomegaly is new since 2015. Other mediastinal contours are within normal limits. Visualized tracheal air column is within normal limits. No pneumothorax or pulmonary edema. There is a small right pleural effusion with  patchy mostly middle lobe right lung opacity. There is also subtle new right perihilar patchy opacity (arrows). Mild linear opacity at the left lung base most resembles atelectasis. Chronic abdominal surgical clips. Negative visible bowel gas pattern. No acute osseous abnormality identified. IMPRESSION: 1. Small right pleural effusion with patchy multifocal right lung opacity which is nonspecific but suspicious for acute bronchopneumonia. 2. Mild cardiomegaly since 2015. Electronically Signed   By: Genevie Ann M.D.   On: 05/04/2016 14:44   Ct Angio Chest Pe W Or Wo Contrast  Result Date: 05/04/2016 CLINICAL DATA:  Worsening chest pain for 1 week. EXAM: CT ANGIOGRAPHY CHEST WITH CONTRAST TECHNIQUE: Multidetector CT imaging of the chest was performed using the standard protocol during bolus administration of intravenous contrast. Multiplanar CT image reconstructions and MIPs were obtained to evaluate the vascular anatomy. CONTRAST:  100 cc Isovue 370 IV COMPARISON:  Chest radiograph earlier this day FINDINGS: Cardiovascular: There are no filling defects within the pulmonary arteries to suggest pulmonary embolus. Mild multi chamber cardiomegaly. There is atherosclerosis of the thoracic aorta without aneurysm. Mediastinum/Nodes: Bulky right hilar adenopathy, node measures 3.4 cm. Enlarged lower paratracheal node suspected measuring 13 mm short axis, however motion artifact is present. There is a prominent sub- carinal node measuring 10 mm. No left hilar adenopathy. No definite supraclavicular adenopathy. Peripherally calcified nodule in the right lobe of the thyroid gland measures 15 mm. The esophagus is decompressed. Lungs/Pleura: Central right upper lobe pulmonary nodule measures 2.3 x 1.7 cm with ill-defined margins. There is perihilar ill-defined ground-glass opacity in the upper lobe, however breathing motion in this region, difficult to exclude involvement of superior segment of the right lower lobe. This  ground-glass opacity appears multifocal but centered around the central airways. There are a few nodular opacities in the right lower lobe for example 6 x 8 mm nodule image 86 series 407. Small right lower lobe nodules more inferiorly. Ill-defined density  in the anterior most right lower lobe about the costophrenic sulcus. There is a 2.1 x 2.0 cm soft tissue density abutting the mediastinum in the right middle lobe image 102 series 407. Within the lingula there is nonspecific 1.7 x 2.3 cm nodularity abutting the pleura. Ill-defined subpleural nodularity in the anterior left upper lobe image 72 series 407. There small bilateral pleural effusions. Upper Abdomen: No evidence of adrenal nodule. No definite acute abnormality. Laxity of the anterior abdominal wall with probable ventral abdominal wall hernia is only partially included. Musculoskeletal: There are no acute or suspicious osseous abnormalities. No blastic or destructive lytic lesions. Review of the MIP images confirms the above findings. IMPRESSION: 1. Irregular 2.3 cm right upper lobe pulmonary nodule suspicious for primary bronchogenic malignancy. There is bulky right hilar and mild mediastinal adenopathy. 2. Right upper lobe perihilar ground-glass opacities are nonspecific, a be neoplastic, infectious or inflammatory. 3. Paramediastinal soft tissue densities in the lingula and right middle lobe, left upper and right lower lobe pulmonary nodules are also nonspecific. 4. Small pleural effusions. 5. No pulmonary embolus. These results will be called to the ordering clinician or representative by the Radiologist Assistant, and communication documented in the PACS or zVision Dashboard. Electronically Signed   By: Jeb Levering M.D.   On: 05/04/2016 22:20    Lab Data:  CBC:  Recent Labs Lab 05/04/16 1731 05/05/16 1009 05/06/16 0452 05/07/16 0430 05/08/16 0442 05/10/16 0515  WBC  --  8.8 8.9 7.8 9.0 7.9  NEUTROABS 6.5  --   --   --   --   --     HGB  --  8.8* 8.6* 8.0* 8.2* 8.3*  HCT  --  28.4* 28.0* 26.7* 27.2* 27.1*  MCV  --  82.1 81.4 82.4 81.9 81.1  PLT  --  521* 466* 433* 412* 858   Basic Metabolic Panel:  Recent Labs Lab 05/06/16 0452 05/07/16 0430 05/08/16 0442 05/09/16 0528 05/10/16 0515  NA 131* 133* 132* 132* 128*  K 4.0 3.9 3.8 4.4 3.4*  CL 93* 94* 95* 96* 93*  CO2 '29 30 29 '$ 32 31  GLUCOSE 106* 95 100* 98 102*  BUN 22* '18 17 13 9  '$ CREATININE 1.14 1.12 1.03 1.03 0.96  CALCIUM 8.9 8.6* 8.5* 8.4* 8.4*   GFR: Estimated Creatinine Clearance: 73.1 mL/min (by C-G formula based on SCr of 0.96 mg/dL). Liver Function Tests:  Recent Labs Lab 05/10/16 0515  AST 38  ALT 27  ALKPHOS 78  BILITOT 0.6  PROT 6.5  ALBUMIN 2.9*   No results for input(s): LIPASE, AMYLASE in the last 168 hours. No results for input(s): AMMONIA in the last 168 hours. Coagulation Profile:  Recent Labs Lab 05/06/16 0452  INR 1.21   Cardiac Enzymes:  Recent Labs Lab 05/04/16 1900 05/05/16 0114 05/05/16 0710  TROPONINI <0.03 <0.03 <0.03   BNP (last 3 results) No results for input(s): PROBNP in the last 8760 hours. HbA1C: No results for input(s): HGBA1C in the last 72 hours. CBG: No results for input(s): GLUCAP in the last 168 hours. Lipid Profile: No results for input(s): CHOL, HDL, LDLCALC, TRIG, CHOLHDL, LDLDIRECT in the last 72 hours. Thyroid Function Tests: No results for input(s): TSH, T4TOTAL, FREET4, T3FREE, THYROIDAB in the last 72 hours. Anemia Panel: No results for input(s): VITAMINB12, FOLATE, FERRITIN, TIBC, IRON, RETICCTPCT in the last 72 hours. Urine analysis:    Component Value Date/Time   BILIRUBINUR Neg 12/16/2013 1328   PROTEINUR Trace 12/16/2013 1328  UROBILINOGEN 0.2 12/16/2013 1328   NITRITE Neg 12/16/2013 1328   LEUKOCYTESUR Negative 12/16/2013 1328     Amaru Burroughs M.D. [facility] 05/11/2016, 1:01 PM  Pager: [phone] Between 7am to 7pm - call Pager - [phone]  After 7pm go  to [email or web address] - password [email or web address]  Call night coverage person covering after 7pm

## 2016-05-11 NOTE — Anesthesia Postprocedure Evaluation (Addendum)
Anesthesia Post Note  Patient: Steven Krause  Procedure(s) Performed: Procedure(s) (LRB): ESOPHAGOGASTRODUODENOSCOPY (EGD) (N/A) COLONOSCOPY (N/A)  Patient location during evaluation: PACU Anesthesia Type: MAC Level of consciousness: awake and alert Pain management: pain level controlled Vital Signs Assessment: post-procedure vital signs reviewed and stable Respiratory status: spontaneous breathing, nonlabored ventilation, respiratory function stable and patient connected to nasal cannula oxygen Cardiovascular status: stable and blood pressure returned to baseline Anesthetic complications: no       Last Vitals:  Vitals:   05/10/16 2003 05/11/16 0501  BP: 134/68 127/61  Pulse: (!) 43 (!) 43  Resp: 18 18  Temp: 36.7 C 36.7 C    Last Pain:  Vitals:   05/11/16 0501  TempSrc: Oral  PainSc:                  Yalexa Blust S

## 2016-05-11 NOTE — Progress Notes (Signed)
      Progress Note   Subjective  Patient had EGD and colonoscopy yesterday, followed by capsule endoscopy last night. Report from capsule interpreted as below. Patient has been tolerating a diet and feels well today. No abdominal pains.    Objective   Vital signs in last 24 hours: Temp:  [97.4 F (36.3 C)-98.1 F (36.7 C)] 97.4 F (36.3 C) (03/07 1206) Pulse Rate:  [43-61] 61 (03/07 1206) Resp:  [18-20] 20 (03/07 1206) BP: (127-134)/(61-80) 127/80 (03/07 1206) SpO2:  [95 %-99 %] 99 % (03/07 1206) Weight:  [194 lb 4.8 oz (88.1 kg)] 194 lb 4.8 oz (88.1 kg) (03/07 0501) Last BM Date: 05/10/16 General:    white male in NAD Heart:  Regular rate and rhythm;  Lungs: Respirations even and unlabored,  Abdomen:  Soft, nontender, protuberant with ventral hernia noted.  Extremities:  (+) 1-2 edema. Neurologic:  Alert and oriented,  grossly normal neurologically. Psych:  Cooperative. Normal mood and affect.  Intake/Output from previous day: 03/06 0701 - 03/07 0700 In: 720 [P.O.:320; I.V.:300; IV Piggyback:100] Out: 200 [Urine:200] Intake/Output this shift: Total I/O In: 6 [I.V.:6] Out: 2 [Urine:2]  Lab Results:  Recent Labs  05/10/16 0515  WBC 7.9  HGB 8.3*  HCT 27.1*  PLT 395   BMET  Recent Labs  05/09/16 0528 05/10/16 0515  NA 132* 128*  K 4.4 3.4*  CL 96* 93*  CO2 32 31  GLUCOSE 98 102*  BUN 13 9  CREATININE 1.03 0.96  CALCIUM 8.4* 8.4*   LFT  Recent Labs  05/10/16 0515  PROT 6.5  ALBUMIN 2.9*  AST 38  ALT 27  ALKPHOS 78  BILITOT 0.6   PT/INR No results for input(s): LABPROT, INR in the last 72 hours.  Studies/Results: No results found.     Assessment / Plan:   76 y/o male with atrial fibrillation and CHF, s/p cardiac cath, with anemia, iron deficient, guaiac positive in the setting of anticoagulation.   EGD showed a small hiatal hernia and mild gastritis. 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 He had a benign hyperplastic polyp which was removed. Colonoscopy showed R colon surgical anastomosis but otherwise normal. Capsule endoscopy interpreted today - no pathology noted to cause anemia. Surgical anastomosis noted in the distal ileum.   Overall unclear cause of iron deficiency in the setting of anticoagulation based on studies to date, I do not appreciate any high risk lesions. He's not had any overt bleeding. Perhaps he has very small AVMs not seen on capsule (prep was fair in certain areas). He denies recent phlebotomy in regards to history of polycythemia.   At this time recommend the following: - regular diet - continue PPI for gastritis noted on EGD - IV iron infusion while hospitalized to help with anemia / iron deficiency - I think if anticoagulation or antiplatelet therapy is needed regarding his cardiovascular issues given recent cardiac cath, it would be okay to start this and monitor Hgb as he has never had overt bleeding. If using oral anticoagulant, Eliquis may be preferred given lower risk of associated GI bleeding. Hopefully with IV iron this is stable and improves his anemia.   We can follow him up as outpatient after discharge. Call in the interim with any questions / concerns.   Dysart Cellar, MD [facility] Pager [phone]

## 2016-05-12 ENCOUNTER — Institutional Professional Consult (permissible substitution): Payer: Self-pay | Admitting: Internal Medicine

## 2016-05-12 ENCOUNTER — Telehealth: Payer: Self-pay | Admitting: *Deleted

## 2016-05-12 DIAGNOSIS — D508 Other iron deficiency anemias: Secondary | ICD-10-CM

## 2016-05-12 MED ORDER — APIXABAN 5 MG PO TABS: 5.0000 mg | ORAL_TABLET | Freq: Two times a day (BID) | ORAL | 3 refills | Status: DC

## 2016-05-12 MED ORDER — SPIRONOLACTONE 25 MG PO TABS: 25.0000 mg | ORAL_TABLET | Freq: Every day | ORAL | Status: DC

## 2016-05-12 MED ORDER — PANTOPRAZOLE SODIUM 40 MG PO TBEC: 40.0000 mg | DELAYED_RELEASE_TABLET | Freq: Every day | ORAL | 3 refills | Status: DC

## 2016-05-12 MED ORDER — POLYSACCHARIDE IRON COMPLEX 150 MG PO CAPS: 150.0000 mg | ORAL_CAPSULE | Freq: Every day | ORAL | 3 refills | Status: DC

## 2016-05-12 MED ORDER — FUROSEMIDE 40 MG PO TABS: 40.0000 mg | ORAL_TABLET | Freq: Two times a day (BID) | ORAL | Status: DC

## 2016-05-12 NOTE — Progress Notes (Signed)
Pt has orders to be discharged. Discharge instructions given and pt has no additional questions at this time. Medication regimen reviewed and pt educated. Pt verbalized understanding and has no additional questions. Telemetry box removed. IV removed and site in good condition. Pt stable and waiting for transportation.   Brenton Joines RN 

## 2016-05-12 NOTE — Progress Notes (Signed)
Tech offered bath to Pt. Pt stated to tech that his bath has already been done.

## 2016-05-12 NOTE — Telephone Encounter (Signed)
NP with Dr. Tana Coast called and stated that patient needed a repeat CBC scheduled next week due to Anemia. Stated that patient is being discharged today. She stated that Discharging nurse will call back to schedule a Hospital follow up with Dr. Mariea Clonts.  Lab appointment given and order placed for CBC.

## 2016-05-12 NOTE — Discharge Summary (Signed)
Physician Discharge Summary   Patient ID: Steven Krause MRN: [id number] DOB/AGE: 1940-10-21 76 y.o.  Admit date: 05/04/2016 Discharge date: 05/12/2016  Primary Care Physician:  Hollace Kinnier, DO  Discharge Diagnoses:      Acute grade 2 diastolic CHF  2.3 cm right upper lobe pulmonary nodule- new finding . Chronic Anemia . Chronic atrial fibrillation ([place]) . Essential hypertension . CAP (community acquired pneumonia)    Consults:  Cardiology Gastroenterology  Recommendations for Outpatient Follow-up:  1. Please repeat CBC/BMET at next visit CT angiogram of the chest was negative for PE however showed irregular 2.3 cm right upper lobe, the nodule suspicious for primary bronchogenic malignancy with bulky right hilar and mild mediastinal adenopathy. The patient has a follow-up appointment scheduled with pulmonology on 05/31/16   DIET: Heart healthy diet    Allergies:   Allergies  Allergen Reactions  . Statins     unknown     DISCHARGE MEDICATIONS: Discharge Medication List as of 05/12/2016 10:43 AM    START taking these medications   Details  apixaban (ELIQUIS) 5 MG TABS tablet Take 1 tablet (5 mg total) by mouth 2 (two) times daily., Starting Thu 05/12/2016, Print    iron polysaccharides (NIFEREX) 150 MG capsule Take 1 capsule (150 mg total) by mouth daily., Starting Fri 05/13/2016, Print    pantoprazole (PROTONIX) 40 MG tablet Take 1 tablet (40 mg total) by mouth daily at 6 (six) AM., Starting Fri 05/13/2016, Print      CONTINUE these medications which have NOT CHANGED   Details  furosemide (LASIX) 40 MG tablet TAKE 1 TABLET IN THE MORNING AND TAKE 1 TABLET IN THE EVENING, Normal    HYDROcodone-acetaminophen (NORCO) 5-325 MG tablet Take 1 tablet by mouth every 6 (six) hours as needed for moderate pain., Starting Mon 04/18/2016, Print    potassium chloride SA (K-DUR,KLOR-CON) 20 MEQ tablet Take 1 tablet (20 mEq total) by mouth daily., Starting Mon 12/28/2015, Normal     spironolactone (ALDACTONE) 25 MG tablet Take 1 tablet (25 mg total) by mouth daily., Starting Mon 04/18/2016, Normal    tamsulosin (FLOMAX) 0.4 MG CAPS capsule TAKE ONE CAPSULE BY MOUTH ONCE DAILY, Normal    ZETIA 10 MG tablet TAKE 1 TABLET BY MOUTH EVERY DAY, Normal      STOP taking these medications     amLODipine (NORVASC) 10 MG tablet      XARELTO 20 MG TABS tablet          Brief H and P: For complete details please refer to admission H and P, but in brief Patient is a 76 year old male with history of polycythemia, abdominal hernia, hypertension, hypertension presented with shortness of breath and chest tightness since this morning and worsening peripheral edema in the last 2 weeks. History was obtained from the patient. The patient reported that he was in his baseline health this morning when he woke up. He was eating breakfast when he choked up on the eggs around 8 AM and since then he felt that he was not able to catch his breath. Patient also felt pressure-like chest tightness with no radiation. He does have abdominal distention but he attributes it to his abdominal hernia, no abdominal pain. He denies any orthopnea or PND however when he sat up for his examination, he stated he felt better. Patient denied any hematochezia or melena to me however to EDP, he reported intermittent block stools. hemoglobin 9.2. Hemoglobin was 10.2 on 2/12 and 12.9 on 12/25/15, gradually has been trending  down.FOBT positive   Hospital Course:   Acute CHF (congestive heart failure) ([place]): Unknown EF - Patient presented with exertional dyspnea, chest tightness and worsening peripheral edema in the last 2 weeks prior to admission and elevated BNP.  Chest x-ray showed small right pleural effusion with patchy multifocal right lung opacity, nonspecific but suspicious for acute bronchopneumonia - Troponins negative, d-dimer elevated, CT angiogram of the chest negative for PE however showed irregular 2.3 cm  right upper lobe pulmonary nodule suspicious for primary bronchogenic malignancy with bulky right hilar and mediastinal adenopathy.  -  Doppler ultrasound of the lower extremities negative for DVT - Cardiac cath showed marked aneurysmal CAD, RCA large and aneurysmal, only obstructive disease is a tiny distal RCA branch plan medical management - Peripheral edema also likely due to hypoalbuminemia.  2-D echo showed EF of 60-65% with grade 2 diastolic dysfunction, PA pressure elevated at 51 mmHg. Cardiology placed patient on aggressive IV diuresis.  Recommended to discharge home on Lasix twice a day and Aldactone 25 mg daily    2.3 cm right upper lobe pulmonary nodule/tumor - CT angiogram of the chest was negative for PE however showed irregular 2.3 cm right upper lobe, the nodule suspicious for primary bronchogenic malignancy with bulky right hilar and mild mediastinal adenopathy - Explained in detail to the patient, discussed with pulmonology, Dr Rogene Houston,  will likely need outpatient PET CT and biopsy, recommended to make follow-up appointment outpatient - follow-up scheduled with Dr. Melvyn Novas on 05/31/16 at 4:00PM for further workup  Chronic Anemia - Anemia panel showed iron deficiency anemia. Colonoscopy in 07/2012 had shown internal hemorrhoids otherwise negative. Xarelto was held - EGD and colonoscopy without any clear cause for iron deficiency anemia and guaiac positive stools. Patient underwent capsule endoscopy. Per GI complete evaluation for iron deficiency anemia without clear cause, perhaps small bowel AVMs. Per GI, reasonable to resume anticoagulation with Eliquis as it may have lower risk of GI bleeding compared to other anticoagulants. He will need to be monitored with CBC closely with this regimen. - Patient was given IV iron and also discharged on iron supplementation at discharge.  Chronic atrial fibrillation ([facility]) - Ventricular response slow in 60s, hence no beta blockers  - Placed  on eliquis per GI recommendation as it has lower risk of GI bleeding compared to other oral anticoagulants. Xarelto was discontinued.  Essential hypertension - Currently stable, continue Lasix, Aldactone.   CAP (community acquired pneumonia): Possibly due to aspiration due tochoking on the breakfast  - Chest x-ray shows small right pleural effusion with patchy multifocal right lung opacity, nonspecific but suspicious for acute bronchopneumonia - Patient received IV Rocephin and Zithromax and completed the full course of antibiotics through the hospitalization. -Urine strep antigen negative, pro-calcitonin <0.1    Day of Discharge BP 118/73 (BP Location: Left Arm)   Pulse 67   Temp 98.6 F (37 C) (Oral)   Resp 18   Ht '5\' 9"'$  (1.753 m)   Wt 86.5 kg (190 lb 12.8 oz)   SpO2 96%   BMI 28.18 kg/m   Physical Exam: General: Alert and awake oriented x3 not in any acute distress. HEENT: anicteric sclera, pupils reactive to light and accommodation CVS: S1-S2 clear no murmur rubs or gallops Chest: clear to auscultation bilaterally, no wheezing rales or rhonchi Abdomen: soft nontender, nondistended, normal bowel sounds Extremities: no cyanosis, clubbing, 1+ edema noted bilaterally Neuro: Cranial nerves II-XII intact, no focal neurological deficits   The results of significant diagnostics from  this hospitalization (including imaging, microbiology, ancillary and laboratory) are listed below for reference.    LAB RESULTS: Basic Metabolic Panel:  Recent Labs Lab 05/09/16 0528 05/10/16 0515  NA 132* 128*  K 4.4 3.4*  CL 96* 93*  CO2 32 31  GLUCOSE 98 102*  BUN 13 9  CREATININE 1.03 0.96  CALCIUM 8.4* 8.4*   Liver Function Tests:  Recent Labs Lab 05/10/16 0515  AST 38  ALT 27  ALKPHOS 78  BILITOT 0.6  PROT 6.5  ALBUMIN 2.9*   No results for input(s): LIPASE, AMYLASE in the last 168 hours. No results for input(s): AMMONIA in the last 168 hours. CBC:  Recent  Labs Lab 05/08/16 0442 05/10/16 0515  WBC 9.0 7.9  HGB 8.2* 8.3*  HCT 27.2* 27.1*  MCV 81.9 81.1  PLT 412* 395   Cardiac Enzymes: No results for input(s): CKTOTAL, CKMB, CKMBINDEX, TROPONINI in the last 168 hours. BNP: Invalid input(s): POCBNP CBG: No results for input(s): GLUCAP in the last 168 hours.  Significant Diagnostic Studies:  Dg Chest 2 View  Result Date: 05/04/2016 CLINICAL DATA:  76 year old male with weakness, dizziness, shortness of breath. Initial encounter. EXAM: CHEST  2 VIEW COMPARISON:  12/16/2013 and earlier. FINDINGS: Mild cardiomegaly is new since 2015. Other mediastinal contours are within normal limits. Visualized tracheal air column is within normal limits. No pneumothorax or pulmonary edema. There is a small right pleural effusion with patchy mostly middle lobe right lung opacity. There is also subtle new right perihilar patchy opacity (arrows). Mild linear opacity at the left lung base most resembles atelectasis. Chronic abdominal surgical clips. Negative visible bowel gas pattern. No acute osseous abnormality identified. IMPRESSION: 1. Small right pleural effusion with patchy multifocal right lung opacity which is nonspecific but suspicious for acute bronchopneumonia. 2. Mild cardiomegaly since 2015. Electronically Signed   By: Genevie Ann M.D.   On: 05/04/2016 14:44   Ct Angio Chest Pe W Or Wo Contrast  Result Date: 05/04/2016 CLINICAL DATA:  Worsening chest pain for 1 week. EXAM: CT ANGIOGRAPHY CHEST WITH CONTRAST TECHNIQUE: Multidetector CT imaging of the chest was performed using the standard protocol during bolus administration of intravenous contrast. Multiplanar CT image reconstructions and MIPs were obtained to evaluate the vascular anatomy. CONTRAST:  100 cc Isovue 370 IV COMPARISON:  Chest radiograph earlier this day FINDINGS: Cardiovascular: There are no filling defects within the pulmonary arteries to suggest pulmonary embolus. Mild multi chamber  cardiomegaly. There is atherosclerosis of the thoracic aorta without aneurysm. Mediastinum/Nodes: Bulky right hilar adenopathy, node measures 3.4 cm. Enlarged lower paratracheal node suspected measuring 13 mm short axis, however motion artifact is present. There is a prominent sub- carinal node measuring 10 mm. No left hilar adenopathy. No definite supraclavicular adenopathy. Peripherally calcified nodule in the right lobe of the thyroid gland measures 15 mm. The esophagus is decompressed. Lungs/Pleura: Central right upper lobe pulmonary nodule measures 2.3 x 1.7 cm with ill-defined margins. There is perihilar ill-defined ground-glass opacity in the upper lobe, however breathing motion in this region, difficult to exclude involvement of superior segment of the right lower lobe. This ground-glass opacity appears multifocal but centered around the central airways. There are a few nodular opacities in the right lower lobe for example 6 x 8 mm nodule image 86 series 407. Small right lower lobe nodules more inferiorly. Ill-defined density in the anterior most right lower lobe about the costophrenic sulcus. There is a 2.1 x 2.0 cm soft tissue density abutting the mediastinum  in the right middle lobe image 102 series 407. Within the lingula there is nonspecific 1.7 x 2.3 cm nodularity abutting the pleura. Ill-defined subpleural nodularity in the anterior left upper lobe image 72 series 407. There small bilateral pleural effusions. Upper Abdomen: No evidence of adrenal nodule. No definite acute abnormality. Laxity of the anterior abdominal wall with probable ventral abdominal wall hernia is only partially included. Musculoskeletal: There are no acute or suspicious osseous abnormalities. No blastic or destructive lytic lesions. Review of the MIP images confirms the above findings. IMPRESSION: 1. Irregular 2.3 cm right upper lobe pulmonary nodule suspicious for primary bronchogenic malignancy. There is bulky right hilar and  mild mediastinal adenopathy. 2. Right upper lobe perihilar ground-glass opacities are nonspecific, a be neoplastic, infectious or inflammatory. 3. Paramediastinal soft tissue densities in the lingula and right middle lobe, left upper and right lower lobe pulmonary nodules are also nonspecific. 4. Small pleural effusions. 5. No pulmonary embolus. These results will be called to the ordering clinician or representative by the Radiologist Assistant, and communication documented in the PACS or zVision Dashboard. Electronically Signed   By: Jeb Levering M.D.   On: 05/04/2016 22:20    2D ECHO: Study Conclusions  - Left ventricle: The cavity size was normal. Systolic function was   normal. The estimated ejection fraction was in the range of 60%   to 65%. Wall motion was normal; there were no regional wall   motion abnormalities. Features are consistent with a pseudonormal   left ventricular filling pattern, with concomitant abnormal   relaxation and increased filling pressure (grade 2 diastolic   dysfunction). - Aortic valve: Trileaflet; mildly thickened, mildly calcified   leaflets. Valve mobility was restricted. There was very mild   stenosis. Peak velocity (S): 248 cm/s. Mean gradient (S): 10 mm   Hg. - Left atrium: The atrium was mildly dilated. - Right atrium: The atrium was mildly dilated. - Pulmonary arteries: Systolic pressure was moderately increased.   PA peak pressure: 51 mm Hg (S).  Disposition and Follow-up: Discharge Instructions    Diet - low sodium heart healthy    Complete by:  As directed    Increase activity slowly    Complete by:  As directed        DISPOSITION:  [facility]    Christinia Gully, MD. Go on 05/31/2016.   Specialty:  Pulmonary Disease Why:  '@4'$ :00pm Contact information: [address] [address] [phone]        REED, [name], DO. Go on 05/18/2016.   Specialty:  Geriatric Medicine Why:  go  to lab at her office 3/14 at 845 for blood work.  will not see MD, just have the lab draw.    Contact information: [address] [place] [address] [phone]            Time spent on Discharge: 25mns   Signed:   Jamin Humphries M.D. [facility] 05/12/2016, 12:16 PM Pager: [phone]

## 2016-05-12 NOTE — Progress Notes (Signed)
Daily Rounding Note  05/12/2016, 8:44 AM  LOS: 8 days   SUBJECTIVE:      No complaints.  Happy about pending discharge today.  No bloody or tarry stools.   OBJECTIVE:         Vital signs in last 24 hours:    Temp:  [97.4 F (36.3 C)-98.6 F (37 C)] 98.6 F (37 C) (03/08 0548) Pulse Rate:  [61-67] 67 (03/08 0548) Resp:  [18-20] 18 (03/08 0548) BP: (118-133)/(63-80) 118/73 (03/08 0548) SpO2:  [96 %-99 %] 96 % (03/08 0548) Weight:  [86.5 kg (190 lb 12.8 oz)] 86.5 kg (190 lb 12.8 oz) (03/08 0548) Last BM Date: 05/11/16 Filed Weights   05/10/16 0648 05/11/16 0501 05/12/16 0548  Weight: 88.8 kg (195 lb 11.2 oz) 88.1 kg (194 lb 4.8 oz) 86.5 kg (190 lb 12.8 oz)   General: looks chronically unwell.  Alert, comfortable   Heart: RRR Chest: clear bil.  No cough or labored breathing Abdomen: soft, NT, ND  Extremities: non-pitting pedal edema Neuro/Psych:  Oriented x 3.  No gross deficits.    Intake/Output from previous day: 03/07 0701 - 03/08 0700 In: 786 [P.O.:720; I.V.:66] Out: 1152 [Urine:1152]  Intake/Output this shift: No intake/output data recorded.  Lab Results:  Recent Labs  05/10/16 0515  WBC 7.9  HGB 8.3*  HCT 27.1*  PLT 395   BMET  Recent Labs  05/10/16 0515  NA 128*  K 3.4*  CL 93*  CO2 31  GLUCOSE 102*  BUN 9  CREATININE 0.96  CALCIUM 8.4*   LFT  Recent Labs  05/10/16 0515  PROT 6.5  ALBUMIN 2.9*  AST 38  ALT 27  ALKPHOS 78  BILITOT 0.6   PT/INR No results for input(s): LABPROT, INR in the last 72 hours. Hepatitis Panel No results for input(s): HEPBSAG, HCVAB, HEPAIGM, HEPBIGM in the last 72 hours.  Studies/Results: No results found.   Scheduled Meds: . apixaban  5 mg Oral BID  . azithromycin  500 mg Intravenous Q24H  . cefTRIAXone (ROCEPHIN)  IV  1 g Intravenous Q24H  . ezetimibe  10 mg Oral Daily  . furosemide  80 mg Intravenous BID  . iron polysaccharides  150 mg  Oral Daily  . pantoprazole  40 mg Oral Q0600  . potassium chloride SA  20 mEq Oral Daily  . sodium chloride flush  3 mL Intravenous Q12H  . sodium chloride flush  3 mL Intravenous Q12H  . tamsulosin  0.4 mg Oral Daily   Continuous Infusions: PRN Meds:.sodium chloride, sodium chloride, acetaminophen, HYDROcodone-acetaminophen, ondansetron (ZOFRAN) IV, sodium chloride flush, sodium chloride flush   ASSESMENT:   *  IDA (ferritin 13, iron 11), FOBT + but no overt GI bleeding in pt on Xarelto for a fib.   Has not required PRBCs.  Hgbs relatively stable.  EGD 3/6: HH, gastritis, hyperplastic polyp excised.  Biopsies negative for H Pylori + for gastric intestinal metaplasia.   Colonoscopy 3/6: normal except changes of prior ileo/colonic anastomosis Capsule endoscopy 3/7: normal.   Infed infusion 3/7, oral Niferex initiated. Abixiban restarted. Protonix 1 x qd initiated (no PPI PTA).  *  RUL nodule on CTA chest, suspicious for malignancy.  Plan outpt PET, biopsy.     *  CAP.  Zithromax/rocephin in place.    PLAN   *  Follow up Hgb within the next week after discharge.  PMD is [employer].    *  GI  available prn.  Will sign off.     Steven Krause  05/12/2016, 8:44 AM Pager: [phone]

## 2016-05-12 NOTE — Telephone Encounter (Signed)
Dr. Tana Coast has been paged again this AM. Will await call back.

## 2016-05-12 NOTE — Progress Notes (Signed)
Progress Note  Patient Name: Steven Krause Date of Encounter: 05/12/2016  Primary Cardiologist: Dr. Sallyanne Kuster   Subjective   No chest pain no chest pain. No dyspnea. Post colonoscopy. Ambulating well to the bathroom. No melena. No bleeding. He has restarted his anticoagulation.  Inpatient Medications    Scheduled Meds: . apixaban  5 mg Oral BID  . azithromycin  500 mg Intravenous Q24H  . cefTRIAXone (ROCEPHIN)  IV  1 g Intravenous Q24H  . ezetimibe  10 mg Oral Daily  . furosemide  80 mg Intravenous BID  . iron polysaccharides  150 mg Oral Daily  . pantoprazole  40 mg Oral Q0600  . potassium chloride SA  20 mEq Oral Daily  . sodium chloride flush  3 mL Intravenous Q12H  . sodium chloride flush  3 mL Intravenous Q12H  . tamsulosin  0.4 mg Oral Daily   Continuous Infusions:  PRN Meds: sodium chloride, sodium chloride, acetaminophen, HYDROcodone-acetaminophen, ondansetron (ZOFRAN) IV, sodium chloride flush, sodium chloride flush   Vital Signs    Vitals:   05/11/16 0501 05/11/16 1206 05/11/16 2016 05/12/16 0548  BP: 127/61 127/80 133/63 118/73  Pulse: (!) 43 61 66 67  Resp: '18 20 18 18  '$ Temp: 98.1 F (36.7 C) 97.4 F (36.3 C) 97.4 F (36.3 C) 98.6 F (37 C)  TempSrc: Oral Oral Oral Oral  SpO2: 95% 99% 98% 96%  Weight: 194 lb 4.8 oz (88.1 kg)   190 lb 12.8 oz (86.5 kg)  Height:        Intake/Output Summary (Last 24 hours) at 05/12/16 0850 Last data filed at 05/12/16 0847  Gross per 24 hour  Intake             1026 ml  Output             1152 ml  Net             -126 ml   Filed Weights   05/10/16 0648 05/11/16 0501 05/12/16 0548  Weight: 195 lb 11.2 oz (88.8 kg) 194 lb 4.8 oz (88.1 kg) 190 lb 12.8 oz (86.5 kg)    Telemetry    Atrial fibrillation rate cont- Personally Reviewed    Physical Exam   GEN: No acute distress.  Dentition in poor repair.  Neck: No JVD Cardiac: irreguarlly irregular, bradycardia no murmurs, rubs, or gallops.  Respiratory:  faint bibasilar rales. GI: Soft, nontender, non-distended  MS: 2+ bilateral LEE pitting edema, improving; No deformity. Neuro:  Nonfocal  Psych: Normal affect   Labs    Chemistry  Recent Labs Lab 05/08/16 0442 05/09/16 0528 05/10/16 0515  NA 132* 132* 128*  K 3.8 4.4 3.4*  CL 95* 96* 93*  CO2 29 32 31  GLUCOSE 100* 98 102*  BUN '17 13 9  '$ CREATININE 1.03 1.03 0.96  CALCIUM 8.5* 8.4* 8.4*  PROT  --   --  6.5  ALBUMIN  --   --  2.9*  AST  --   --  38  ALT  --   --  27  ALKPHOS  --   --  78  BILITOT  --   --  0.6  GFRNONAA >60 >60 >60  GFRAA >60 >60 >60  ANIONGAP 8 4* 4*     Hematology  Recent Labs Lab 05/07/16 0430 05/08/16 0442 05/10/16 0515  WBC 7.8 9.0 7.9  RBC 3.24* 3.32* 3.34*  HGB 8.0* 8.2* 8.3*  HCT 26.7* 27.2* 27.1*  MCV 82.4 81.9 81.1  MCH 24.7* 24.7* 24.9*  MCHC 30.0 30.1 30.6  RDW 15.2 14.9 14.5  PLT 433* 412* 395    Cardiac Enzymes No results for input(s): TROPONINI in the last 168 hours.  No results for input(s): TROPIPOC in the last 168 hours.   BNP  Recent Labs Lab 05/06/16 0452  BNP 251.1*     DDimer  No results for input(s): DDIMER in the last 168 hours.   Radiology    No results found.  Cardiac Studies   Procedures   Left Heart Cath and Coronary Angiography  Conclusion     Dist LAD-2 lesion, 30 %stenosed.  Dist LAD-1 lesion, 40 %stenosed.  Inf Sept lesion, 90 %stenosed.  LV end diastolic pressure is mildly elevated.   1. 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  2. Only obstructive disease is in a tiny distal RCA branch. 3. Elevated LVEDP  Plan: medical management.      Patient Profile     76 y.o. male with a PMH significant for rate controlled Afib on xarelto with frequent PVCs, smoker, HTN, anemia, and CHF. He presented to the ED with exertional chest discomfort and dyspnea. Cardiology was consulted for chest pain and possible CHF.   Assessment & Plan      1. Chest pain - EKG without signs of ischemia; troponin x 3 negative - pt has history of HTN, HLD, and smoking and strong family history of heart disease - pt has had chest pain with exertion that is relieved with rest x 3 days. Relieved. 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 Only obstructive disease is a tiny distal RCA branch. Medical management recommended- however medical therapies limited currently. No ASA currently given anemia  No BB given issues with bradycardia. Intolerant to statins. On Zetia. He is stable w/o CP. No dyspnea.   2. Acute diastolic CHF, pleural effusion, LE edema - echocardiogram shows EF 60-65%, Grade 2 DD - BNP 251. - Weight is down 190 from 212! I don't think I believe I:o. - Creat 0.96 - Good overall diuresis.  - He was previously instructed to take 40 twice a day of Lasix at home as well as spironolactone 25 mg daily. I will go ahead and place him back on this regimen. He feels well.  3. Permanent atrial fibrillation, rate controlled - asymptomatic - last dose of xarelto was 05/04/16 evening. On hold for anemia. Reviewed gastroenterology recommendations to use Eliquis given lower overall GI bleeding risks. It would be reasonable to resume. Monitor CBCs closely as outpatient. There was no active signs of bleeding. Perhaps AVM.  - started Eliquis 5 mg twice a day. - not currently on a beta blocker at home 2/2 bradycardia; ventricular rates in the 50s- 60s (occasional 40's)   3. HTN - controlled on current regimen    4. Positive D-Dimer - CTA ruled out PE Venous 2+ scan reveals no evidence of DVT.  5. Anemia - positive FOBT - colonoscopy performed, no clear cause. Capsule done 3/6 - No active source of bleeding.  Comfortable with discharge from a cardiac perspective. We will have follow-up  scheduled. We will go ahead and sign off.  Signed, Candee Furbish, MD  05/12/2016, 8:50 AM

## 2016-05-12 NOTE — Progress Notes (Signed)
Patient is discharged home on Eliquis; Eliquis coupon card given to patient with explanation of usage / activation of card is required prior to usage.Mindi Slicker [facility] [phone]

## 2016-05-13 ENCOUNTER — Telehealth: Payer: Self-pay

## 2016-05-13 NOTE — Telephone Encounter (Signed)
Transition Care Management Follow-Up Telephone Call   Date discharged and where:  How have you been since you were released from the hospital? Pt. Stated he was doing well. His swelling in his legs went down and is having no breathing problems.  Any patient concerns? None  Items Reviewed:   Meds: Yes  Allergies: Yes  Dietary Changes Reviewed:  Functional Questionnaire:  Independent-I Dependent-D  ADLs:   Dressing- I    Eating- I   Maintaining continence- I   Transferring- I   Transportation-I    Meal Prep- I   Managing Meds- I  Confirmed importance and Date/Time of follow-up visits scheduled: Dr Melvyn Novas 3/27 '@4'$  pm. Pt declined follow up with Dr. Mariea Clonts. Next appointment with Mariea Clonts is 5/14 '@3pm'$   Confirmed with patient if condition worsens to call PCP or go to the Emergency Dept. Patient was given office number and encouraged to call back with questions or concerns: Yes

## 2016-05-13 NOTE — Telephone Encounter (Signed)
Pt has been scheduled with MW on 05/31/16. Nothing further was needed.

## 2016-05-18 ENCOUNTER — Other Ambulatory Visit: Payer: Medicare Other

## 2016-05-18 ENCOUNTER — Other Ambulatory Visit: Payer: Self-pay

## 2016-05-18 DIAGNOSIS — D508 Other iron deficiency anemias: Secondary | ICD-10-CM

## 2016-05-18 LAB — CBC WITH DIFFERENTIAL/PLATELET
Basophils Absolute: 89 cells/uL (ref 0–200)
Basophils Relative: 1 %
Eosinophils Absolute: 178 cells/uL (ref 15–500)
Eosinophils Relative: 2 %
HCT: 33.9 % — ABNORMAL LOW (ref 38.5–50.0)
Hemoglobin: 10.5 g/dL — ABNORMAL LOW (ref 13.2–17.1)
Lymphocytes Relative: 12 %
Lymphs Abs: 1068 cells/uL (ref 850–3900)
MCH: 26 pg — ABNORMAL LOW (ref 27.0–33.0)
MCHC: 31 g/dL — ABNORMAL LOW (ref 32.0–36.0)
MCV: 83.9 fL (ref 80.0–100.0)
MPV: 10 fL (ref 7.5–12.5)
Monocytes Absolute: 712 cells/uL (ref 200–950)
Monocytes Relative: 8 %
Neutro Abs: 6853 cells/uL (ref 1500–7800)
Neutrophils Relative %: 77 %
Platelets: 398 10*3/uL (ref 140–400)
RBC: 4.04 MIL/uL — ABNORMAL LOW (ref 4.20–5.80)
RDW: 17.7 % — ABNORMAL HIGH (ref 11.0–15.0)
WBC: 8.9 10*3/uL (ref 3.8–10.8)

## 2016-05-19 ENCOUNTER — Encounter: Payer: Self-pay | Admitting: *Deleted

## 2016-05-31 ENCOUNTER — Institutional Professional Consult (permissible substitution): Payer: Self-pay | Admitting: Internal Medicine

## 2016-06-06 ENCOUNTER — Ambulatory Visit: Payer: Medicare Other | Admitting: Cardiology

## 2016-06-06 ENCOUNTER — Encounter: Payer: Self-pay | Admitting: *Deleted

## 2016-06-06 ENCOUNTER — Ambulatory Visit: Payer: Self-pay | Admitting: Cardiology

## 2016-07-18 ENCOUNTER — Encounter: Payer: Self-pay | Admitting: Internal Medicine

## 2016-07-18 ENCOUNTER — Ambulatory Visit (INDEPENDENT_AMBULATORY_CARE_PROVIDER_SITE_OTHER): Payer: Medicare Other | Admitting: Internal Medicine

## 2016-07-18 VITALS — BP 128/76 | HR 73 | Temp 98.0°F | Ht 69.0 in | Wt 205.0 lb

## 2016-07-18 DIAGNOSIS — I482 Chronic atrial fibrillation, unspecified: Secondary | ICD-10-CM

## 2016-07-18 DIAGNOSIS — J189 Pneumonia, unspecified organism: Secondary | ICD-10-CM

## 2016-07-18 DIAGNOSIS — R918 Other nonspecific abnormal finding of lung field: Secondary | ICD-10-CM

## 2016-07-18 DIAGNOSIS — D508 Other iron deficiency anemias: Secondary | ICD-10-CM

## 2016-07-18 DIAGNOSIS — F17200 Nicotine dependence, unspecified, uncomplicated: Secondary | ICD-10-CM | POA: Diagnosis not present

## 2016-07-18 MED ORDER — ZOSTER VAC RECOMB ADJUVANTED 50 MCG/0.5ML IM SUSR: 0.5000 mL | Freq: Once | INTRAMUSCULAR | 1 refills | Status: AC

## 2016-07-18 NOTE — Progress Notes (Signed)
Location:  [facility] clinic Provider:  Misael Mcgaha L. Mariea Clonts, D.O., C.M.D.  Code Status: DNR Goals of Care:  Advanced Directives 07/18/2016  Does Patient Have a Medical Advance Directive? Yes  Type of Advance Directive [place]  Does patient want to make changes to medical advance directive? No - Patient declined  Copy of [place] in Chart? No - copy requested  Would patient like information on creating a medical advance directive? -   Chief Complaint  Patient presents with  . Medical Management of Chronic Issues    3 month follow-up  . Medication Refill    No refills needed   . Immunizations    Patient agreed to Shingles rx (sent to pharmacy)    HPI:  76 yo male with h/o diastolic chf, afib, tobacco abuse (pipe for years), hernias, hiatal hernia, iron deficiency anemia.  He was in the hospital 2/28-05/12/16 with congestion and difficulty coughing up mucus.  He has no concerns.  He was in the hospital with chest pain.  He had pneumonia.  Had a large nodule 2.3cm right upper lobe and bulky hilar adenopathy.  Left leg more swollen than right.  His weight was up to 213 and down to 189 at discharge.  He is 205 today.  He is on eliquis '5mg'$  po bid, iron daily, protonix.  He remains on lasix and potassium.  His amlodipine and xarelto were stopped.  He reports not having transportation to get to the appt with Dr. Melvyn Novas to f/u after his hospitalization.  Counseled on importance of rescheduling.  No longer sob.  Not smoking as much--just a puff or two and stopping.  He says he has a pulmonary appt but I don't see it in here.    Past Medical History:  Diagnosis Date  . Abdominal pain, generalized   . Acquired polycythemia 01/05/2011  . Allergy   . Diarrhea   . Dizziness and giddiness   . Hernia of unspecified site of abdominal cavity without mention of obstruction or gangrene   . Hypertension   . Hypopotassemia   . Iron excess 01/05/2011  . Memory loss   . Other  specified cardiac dysrhythmias(427.89)   . 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 Polycythemia, secondary   . Systemic hypertension   . Tobacco use disorder   . Unspecified disorder of kidney and ureter   . Unspecified disorder of skin and subcutaneous tissue   . Unspecified hearing loss     Past Surgical History:  Procedure Laterality Date  . CHOLECYSTECTOMY    . COLONOSCOPY N/A 05/10/2016   Procedure: COLONOSCOPY;  Surgeon: Manus Gunning, MD;  Location: [place];  Service: Gastroenterology;  Laterality: N/A;  . ESOPHAGOGASTRODUODENOSCOPY N/A 05/10/2016   Procedure: ESOPHAGOGASTRODUODENOSCOPY (EGD);  Surgeon: Manus Gunning, MD;  Location: [place];  Service: Gastroenterology;  Laterality: N/A;  . GIVENS CAPSULE STUDY N/A 05/10/2016   Procedure: GIVENS CAPSULE STUDY;  Surgeon: Manus Gunning, MD;  Location: [place];  Service: Gastroenterology;  Laterality: N/A;  . INFLAMED COLON    . LEFT HEART CATH AND CORONARY ANGIOGRAPHY N/A 05/06/2016   Procedure: Left Heart Cath and Coronary Angiography;  Surgeon: Peter M Martinique, MD;  Location: [place] [facility];  Service: Cardiovascular;  Laterality: N/A;  . NM MYOCAR PERF WALL MOTION  8//30/11   normal  . US ECHOCARDIOGRAPHY  11/03/09   EF 50-55%,  . VENTRAL HERNIA REPAIR      Allergies  Allergen Reactions  . Statins  unknown    Allergies as of 07/18/2016      Reactions   Statins    unknown      Medication List       Accurate as of 07/18/16  3:18 PM. Always use your most recent med list.          apixaban 5 MG Tabs tablet Commonly known as:  ELIQUIS Take 1 tablet (5 mg total) by mouth 2 (two) times daily.   furosemide 40 MG tablet Commonly known as:  LASIX TAKE 1 TABLET IN THE MORNING AND TAKE 1 TABLET IN THE EVENING   HYDROcodone-acetaminophen 5-325 MG tablet Commonly known as:  NORCO Take 1 tablet by mouth every 6 (six) hours as needed for moderate pain.   iron polysaccharides 150 MG  capsule Commonly known as:  NIFEREX Take 1 capsule (150 mg total) by mouth daily.   pantoprazole 40 MG tablet Commonly known as:  PROTONIX Take 1 tablet (40 mg total) by mouth daily at 6 (six) AM.   potassium chloride SA 20 MEQ tablet Commonly known as:  K-DUR,KLOR-CON Take 1 tablet (20 mEq total) by mouth daily.   spironolactone 25 MG tablet Commonly known as:  ALDACTONE Take 1 tablet (25 mg total) by mouth daily.   tamsulosin 0.4 MG Caps capsule Commonly known as:  FLOMAX TAKE ONE CAPSULE BY MOUTH ONCE DAILY   ZETIA 10 MG tablet Generic drug:  ezetimibe TAKE 1 TABLET BY MOUTH EVERY DAY   Zoster Vac Recomb Adjuvanted injection Commonly known as:  SHINGRIX Inject 0.5 mLs into the muscle once.      Review of Systems:  Review of Systems  Constitutional: Negative for chills, fever and malaise/fatigue.  HENT:       A few rotten teeth on the bottom  Eyes: Negative for blurred vision.  Respiratory: Positive for wheezing. Negative for shortness of breath.   Cardiovascular: Positive for leg swelling. Negative for chest pain and palpitations.       Left greater than right  Gastrointestinal: Negative for abdominal pain, blood in stool, constipation, diarrhea and melena.       No "green" bms like he says he had when he was bleeding, on iron so stools can be dark  Genitourinary: Negative for dysuria.  Musculoskeletal: Negative for falls.  Neurological: Negative for dizziness, loss of consciousness and weakness.  Psychiatric/Behavioral: Positive for memory loss. Negative for depression.    Health Maintenance  Topic Date Due  . INFLUENZA VACCINE  10/05/2016  . TETANUS/TDAP  04/20/2021  . COLONOSCOPY  05/10/2021  . PNA vac Low Risk Adult  Completed    Physical Exam: Vitals:   07/18/16 1507  BP: 128/76  Pulse: 73  Temp: 98 F (36.7 C)  TempSrc: Oral  SpO2: 95%  Weight: 205 lb (93 kg)  Height: '5\' 9"'$  (1.753 m)   Body mass index is 30.27 kg/m. Physical Exam   Constitutional: He is oriented to person, place, and time. He appears well-developed and well-nourished. No distress.  Cardiovascular:  irreg irreg  Pulmonary/Chest: Effort normal. He has wheezes.  Abdominal: Bowel sounds are normal.  Usual multiple hernias and deformity of abdomen  Musculoskeletal: Normal range of motion. He exhibits no tenderness.  Neurological: He is alert and oriented to person, place, and time.  Skin: Skin is warm and dry. Capillary refill takes less than 2 seconds.  Psychiatric: He has a normal mood and affect.    Labs reviewed: Basic Metabolic Panel:  Recent Labs  05/08/16 0442 05/09/16  0528 05/10/16 0515  NA 132* 132* 128*  K 3.8 4.4 3.4*  CL 95* 96* 93*  CO2 29 32 31  GLUCOSE 100* 98 102*  BUN '17 13 9  '$ CREATININE 1.03 1.03 0.96  CALCIUM 8.5* 8.4* 8.4*   Liver Function Tests:  Recent Labs  12/25/15 0845 04/18/16 1520 05/10/16 0515  AST 27 25 38  ALT '19 15 27  '$ ALKPHOS 77 79 78  BILITOT 0.6 0.4 0.6  PROT 7.0 7.2 6.5  ALBUMIN 3.6 3.5* 2.9*   No results for input(s): LIPASE, AMYLASE in the last 8760 hours. No results for input(s): AMMONIA in the last 8760 hours. CBC:  Recent Labs  04/18/16 1520  05/04/16 1731  05/08/16 0442 05/10/16 0515 05/18/16 1333  WBC 7.8  < >  --   < > 9.0 7.9 8.9  NEUTROABS 5,694  --  6.5  --   --   --  6,853  HGB 10.2*  < >  --   < > 8.2* 8.3* 10.5*  HCT 32.7*  < >  --   < > 27.2* 27.1* 33.9*  MCV 84.9  < >  --   < > 81.9 81.1 83.9  PLT 386  < >  --   < > 412* 395 398  < > = values in this interval not displayed. Lipid Panel: No results for input(s): CHOL, HDL, LDLCALC, TRIG, CHOLHDL, LDLDIRECT in the last 8760 hours. Lab Results  Component Value Date   HGBA1C 5.7 (H) 12/25/2015   Assessment/Plan 1. Mass of upper lobe of right lung -found on CT chest during hospitalization -pt continues to smoke and has no plans to quit - has not followed up with pulmonary as directed due to transportation  difficulty--counseled on importance of rescheduling his appt with pulmonary due to the lung mass/lge nodule  2. Other iron deficiency anemia -improved on last check -continues on iron supplement and has not seen any blood in his stool -had internal hemorrhoids, anastomosis and possibly gastritis  3. Chronic atrial fibrillation (HCC) -was changed from xarelto to eliquis and remains on it  4. Tobacco use disorder -continues to smoke pipe awaiting his last few teeth to fall out and not ready to quit despite recent pneumonia, chf, and lung mass  5. Community acquired pneumonia of right lung, unspecified part of lung -completed abx and this is resolved  Labs/tests ordered:  No orders of the defined types were placed in this encounter.  Next appt:  10/31/2016 med mgt   Knox Holdman L. Shaleena Crusoe, D.O. [facility] [address], [place] [address] Cell Phone (Mon-Fri 8am-5pm):  [phone] On Call:  [phone] & follow prompts after 5pm & weekends Office Phone:  [phone] Office Fax:  [phone]

## 2016-07-18 NOTE — Patient Instructions (Signed)
Bring copy of [place] and/or Living Will to next appointment.

## 2016-07-27 ENCOUNTER — Other Ambulatory Visit: Payer: Self-pay

## 2016-08-08 NOTE — Addendum Note (Signed)
Addendum  created 08/08/16 1205 by Kayton Dunaj, MD   Sign clinical note    

## 2016-08-19 MED ORDER — APIXABAN 5 MG PO TABS: 5.0000 mg | ORAL_TABLET | Freq: Two times a day (BID) | ORAL | 5 refills | Status: AC

## 2016-08-19 MED ORDER — POLYSACCHARIDE IRON COMPLEX 150 MG PO CAPS: 150.0000 mg | ORAL_CAPSULE | Freq: Every day | ORAL | 3 refills | Status: AC

## 2016-08-19 MED ORDER — POTASSIUM CHLORIDE CRYS ER 20 MEQ PO TBCR: 20.0000 meq | EXTENDED_RELEASE_TABLET | Freq: Every day | ORAL | 3 refills | Status: AC

## 2016-08-19 MED ORDER — FUROSEMIDE 40 MG PO TABS: ORAL_TABLET | ORAL | 5 refills | Status: AC

## 2016-08-19 MED ORDER — PANTOPRAZOLE SODIUM 40 MG PO TBEC: 40.0000 mg | DELAYED_RELEASE_TABLET | Freq: Every day | ORAL | 3 refills | Status: AC

## 2016-08-19 MED ORDER — EZETIMIBE 10 MG PO TABS: 10.0000 mg | ORAL_TABLET | Freq: Every day | ORAL | 5 refills | Status: AC

## 2016-08-19 MED ORDER — TAMSULOSIN HCL 0.4 MG PO CAPS: 0.4000 mg | ORAL_CAPSULE | Freq: Every day | ORAL | 5 refills | Status: AC

## 2016-08-19 MED ORDER — SPIRONOLACTONE 25 MG PO TABS: 25.0000 mg | ORAL_TABLET | Freq: Every day | ORAL | 5 refills | Status: DC

## 2016-09-04 ENCOUNTER — Encounter (HOSPITAL_COMMUNITY): Payer: Self-pay | Admitting: *Deleted

## 2016-09-04 ENCOUNTER — Emergency Department (HOSPITAL_COMMUNITY): Payer: Medicare Other

## 2016-09-04 ENCOUNTER — Inpatient Hospital Stay (HOSPITAL_COMMUNITY)
Admission: EM | Admit: 2016-09-04 | Discharge: 2016-09-12 | DRG: 193 | Disposition: A | Discharge status: Skilled Nursing Facility | Payer: Medicare Other | Attending: Internal Medicine | Admitting: Internal Medicine

## 2016-09-04 DIAGNOSIS — J440 Chronic obstructive pulmonary disease with acute lower respiratory infection: Secondary | ICD-10-CM | POA: Diagnosis present

## 2016-09-04 DIAGNOSIS — E785 Hyperlipidemia, unspecified: Secondary | ICD-10-CM | POA: Diagnosis present

## 2016-09-04 DIAGNOSIS — I5032 Chronic diastolic (congestive) heart failure: Secondary | ICD-10-CM | POA: Diagnosis not present

## 2016-09-04 DIAGNOSIS — R0602 Shortness of breath: Secondary | ICD-10-CM | POA: Diagnosis not present

## 2016-09-04 DIAGNOSIS — E876 Hypokalemia: Secondary | ICD-10-CM | POA: Diagnosis not present

## 2016-09-04 DIAGNOSIS — Z7901 Long term (current) use of anticoagulants: Secondary | ICD-10-CM

## 2016-09-04 DIAGNOSIS — C787 Secondary malignant neoplasm of liver and intrahepatic bile duct: Secondary | ICD-10-CM

## 2016-09-04 DIAGNOSIS — R008 Other abnormalities of heart beat: Secondary | ICD-10-CM | POA: Diagnosis present

## 2016-09-04 DIAGNOSIS — R0600 Dyspnea, unspecified: Secondary | ICD-10-CM

## 2016-09-04 DIAGNOSIS — I10 Essential (primary) hypertension: Secondary | ICD-10-CM | POA: Diagnosis not present

## 2016-09-04 DIAGNOSIS — E669 Obesity, unspecified: Secondary | ICD-10-CM | POA: Diagnosis present

## 2016-09-04 DIAGNOSIS — F17200 Nicotine dependence, unspecified, uncomplicated: Secondary | ICD-10-CM

## 2016-09-04 DIAGNOSIS — E7800 Pure hypercholesterolemia, unspecified: Secondary | ICD-10-CM | POA: Diagnosis not present

## 2016-09-04 DIAGNOSIS — I482 Chronic atrial fibrillation, unspecified: Secondary | ICD-10-CM | POA: Diagnosis present

## 2016-09-04 DIAGNOSIS — C801 Malignant (primary) neoplasm, unspecified: Secondary | ICD-10-CM

## 2016-09-04 DIAGNOSIS — J90 Pleural effusion, not elsewhere classified: Secondary | ICD-10-CM | POA: Diagnosis present

## 2016-09-04 DIAGNOSIS — Z66 Do not resuscitate: Secondary | ICD-10-CM | POA: Diagnosis present

## 2016-09-04 DIAGNOSIS — K219 Gastro-esophageal reflux disease without esophagitis: Secondary | ICD-10-CM | POA: Diagnosis present

## 2016-09-04 DIAGNOSIS — J441 Chronic obstructive pulmonary disease with (acute) exacerbation: Secondary | ICD-10-CM | POA: Diagnosis present

## 2016-09-04 DIAGNOSIS — R0902 Hypoxemia: Secondary | ICD-10-CM

## 2016-09-04 DIAGNOSIS — R590 Localized enlarged lymph nodes: Secondary | ICD-10-CM | POA: Diagnosis present

## 2016-09-04 DIAGNOSIS — E663 Overweight: Secondary | ICD-10-CM | POA: Diagnosis present

## 2016-09-04 DIAGNOSIS — J9601 Acute respiratory failure with hypoxia: Secondary | ICD-10-CM | POA: Diagnosis not present

## 2016-09-04 DIAGNOSIS — R899 Unspecified abnormal finding in specimens from other organs, systems and tissues: Secondary | ICD-10-CM

## 2016-09-04 DIAGNOSIS — R059 Cough, unspecified: Secondary | ICD-10-CM

## 2016-09-04 DIAGNOSIS — I5030 Unspecified diastolic (congestive) heart failure: Secondary | ICD-10-CM

## 2016-09-04 DIAGNOSIS — Z515 Encounter for palliative care: Secondary | ICD-10-CM

## 2016-09-04 DIAGNOSIS — Z8701 Personal history of pneumonia (recurrent): Secondary | ICD-10-CM

## 2016-09-04 DIAGNOSIS — R918 Other nonspecific abnormal finding of lung field: Secondary | ICD-10-CM | POA: Diagnosis not present

## 2016-09-04 DIAGNOSIS — E875 Hyperkalemia: Secondary | ICD-10-CM | POA: Diagnosis not present

## 2016-09-04 DIAGNOSIS — R911 Solitary pulmonary nodule: Secondary | ICD-10-CM | POA: Diagnosis present

## 2016-09-04 DIAGNOSIS — J9811 Atelectasis: Secondary | ICD-10-CM | POA: Diagnosis present

## 2016-09-04 DIAGNOSIS — J189 Pneumonia, unspecified organism: Principal | ICD-10-CM

## 2016-09-04 DIAGNOSIS — I110 Hypertensive heart disease with heart failure: Secondary | ICD-10-CM | POA: Diagnosis present

## 2016-09-04 DIAGNOSIS — D649 Anemia, unspecified: Secondary | ICD-10-CM | POA: Diagnosis present

## 2016-09-04 DIAGNOSIS — E871 Hypo-osmolality and hyponatremia: Secondary | ICD-10-CM

## 2016-09-04 DIAGNOSIS — I5033 Acute on chronic diastolic (congestive) heart failure: Secondary | ICD-10-CM | POA: Diagnosis present

## 2016-09-04 DIAGNOSIS — Z9889 Other specified postprocedural states: Secondary | ICD-10-CM

## 2016-09-04 DIAGNOSIS — D751 Secondary polycythemia: Secondary | ICD-10-CM | POA: Diagnosis present

## 2016-09-04 DIAGNOSIS — Z79899 Other long term (current) drug therapy: Secondary | ICD-10-CM

## 2016-09-04 DIAGNOSIS — Z87891 Personal history of nicotine dependence: Secondary | ICD-10-CM

## 2016-09-04 DIAGNOSIS — I499 Cardiac arrhythmia, unspecified: Secondary | ICD-10-CM

## 2016-09-04 DIAGNOSIS — R05 Cough: Secondary | ICD-10-CM

## 2016-09-04 DIAGNOSIS — D509 Iron deficiency anemia, unspecified: Secondary | ICD-10-CM | POA: Diagnosis present

## 2016-09-04 DIAGNOSIS — E860 Dehydration: Secondary | ICD-10-CM | POA: Diagnosis present

## 2016-09-04 DIAGNOSIS — I498 Other specified cardiac arrhythmias: Secondary | ICD-10-CM | POA: Diagnosis present

## 2016-09-04 DIAGNOSIS — I509 Heart failure, unspecified: Secondary | ICD-10-CM

## 2016-09-04 DIAGNOSIS — C228 Malignant neoplasm of liver, primary, unspecified as to type: Secondary | ICD-10-CM

## 2016-09-04 DIAGNOSIS — R739 Hyperglycemia, unspecified: Secondary | ICD-10-CM | POA: Diagnosis present

## 2016-09-04 DIAGNOSIS — Z6831 Body mass index (BMI) 31.0-31.9, adult: Secondary | ICD-10-CM

## 2016-09-04 DIAGNOSIS — J181 Lobar pneumonia, unspecified organism: Secondary | ICD-10-CM

## 2016-09-04 HISTORY — DX: Malignant (primary) neoplasm, unspecified: C80.1

## 2016-09-04 LAB — BASIC METABOLIC PANEL
ANION GAP: 11 (ref 5–15)
BUN: 13 mg/dL (ref 6–20)
CHLORIDE: 96 mmol/L — AB (ref 101–111)
CO2: 26 mmol/L (ref 22–32)
CREATININE: 0.96 mg/dL (ref 0.61–1.24)
Calcium: 8.9 mg/dL (ref 8.9–10.3)
GFR calc non Af Amer: >60 mL/min (ref >60)
Glucose, Bld: 117 mg/dL — ABNORMAL HIGH (ref 65–99)
POTASSIUM: 3.3 mmol/L — AB (ref 3.5–5.1)
SODIUM: 133 mmol/L — AB (ref 135–145)

## 2016-09-04 LAB — CBC WITH DIFFERENTIAL/PLATELET
Basophils Absolute: 0 10*3/uL (ref 0.0–0.1)
Basophils Relative: 1 %
EOS ABS: 0.1 10*3/uL (ref 0.0–0.7)
Eosinophils Relative: 1 %
HCT: 37.5 % — ABNORMAL LOW (ref 39.0–52.0)
HEMOGLOBIN: 11.5 g/dL — AB (ref 13.0–17.0)
LYMPHS ABS: 0.7 10*3/uL (ref 0.7–4.0)
LYMPHS PCT: 8 %
MCH: 24.8 pg — AB (ref 26.0–34.0)
MCHC: 30.7 g/dL (ref 30.0–36.0)
MCV: 81 fL (ref 78.0–100.0)
Monocytes Absolute: 0.5 10*3/uL (ref 0.1–1.0)
Monocytes Relative: 6 %
NEUTROS PCT: 84 %
Neutro Abs: 6.7 10*3/uL (ref 1.7–7.7)
Platelets: 263 10*3/uL (ref 150–400)
RBC: 4.63 MIL/uL (ref 4.22–5.81)
RDW: 19.1 % — ABNORMAL HIGH (ref 11.5–15.5)
WBC: 7.9 10*3/uL (ref 4.0–10.5)

## 2016-09-04 LAB — I-STAT TROPONIN, ED: TROPONIN I, POC: 0 ng/mL (ref 0.00–0.08)

## 2016-09-04 LAB — STREP PNEUMONIAE URINARY ANTIGEN: STREP PNEUMO URINARY ANTIGEN: NEGATIVE

## 2016-09-04 LAB — LACTIC ACID, PLASMA
Lactic Acid, Venous: 4.6 mmol/L (ref 0.5–1.9)
Lactic Acid, Venous: 3.8 mmol/L (ref 0.5–1.9)
Lactic Acid, Venous: 2.5 mmol/L (ref 0.5–1.9)

## 2016-09-04 MED ORDER — SODIUM CHLORIDE 0.9 % IV SOLN
INTRAVENOUS | Status: DC
Start: 2016-09-04 — End: 2016-09-05
  Administered 2016-09-04 – 2016-09-05 (×3): via INTRAVENOUS

## 2016-09-04 MED ORDER — ORAL CARE MOUTH RINSE
15.0000 mL | Freq: Two times a day (BID) | OROMUCOSAL | Status: DC
  Administered 2016-09-04 – 2016-09-12 (×14): 15 mL via OROMUCOSAL

## 2016-09-04 MED ORDER — DEXTROSE 5 % IV SOLN
1.0000 g | INTRAVENOUS | Status: DC
  Administered 2016-09-04: 1 g via INTRAVENOUS
  Filled 2016-09-04 (×2): qty 10

## 2016-09-04 MED ORDER — CEFTRIAXONE SODIUM 1 G IJ SOLR
1.0000 g | Freq: Once | INTRAMUSCULAR | Status: DC
  Administered 2016-09-04: 1 g via INTRAVENOUS
  Filled 2016-09-04: qty 10

## 2016-09-04 MED ORDER — DEXTROSE 5 % IV SOLN
500.0000 mg | INTRAVENOUS | Status: DC
  Administered 2016-09-05: 500 mg via INTRAVENOUS
  Filled 2016-09-04 (×2): qty 500

## 2016-09-04 MED ORDER — POTASSIUM CHLORIDE CRYS ER 20 MEQ PO TBCR
20.0000 meq | EXTENDED_RELEASE_TABLET | Freq: Every day | ORAL | Status: DC
  Administered 2016-09-04 – 2016-09-05 (×2): 20 meq via ORAL
  Filled 2016-09-04 (×2): qty 1

## 2016-09-04 MED ORDER — IPRATROPIUM-ALBUTEROL 0.5-2.5 (3) MG/3ML IN SOLN
3.0000 mL | RESPIRATORY_TRACT | Status: DC | PRN
  Administered 2016-09-04 – 2016-09-11 (×7): 3 mL via RESPIRATORY_TRACT
  Filled 2016-09-04 (×7): qty 3

## 2016-09-04 MED ORDER — POLYSACCHARIDE IRON COMPLEX 150 MG PO CAPS
150.0000 mg | ORAL_CAPSULE | Freq: Every day | ORAL | Status: DC
  Administered 2016-09-04 – 2016-09-12 (×9): 150 mg via ORAL
  Filled 2016-09-04 (×9): qty 1

## 2016-09-04 MED ORDER — DEXTROSE 5 % IV SOLN
500.0000 mg | Freq: Once | INTRAVENOUS | Status: AC
  Administered 2016-09-04: 500 mg via INTRAVENOUS
  Filled 2016-09-04: qty 500

## 2016-09-04 MED ORDER — HYDROCODONE-ACETAMINOPHEN 5-325 MG PO TABS
1.0000 | ORAL_TABLET | Freq: Four times a day (QID) | ORAL | Status: DC | PRN
  Administered 2016-09-06 – 2016-09-11 (×7): 1 via ORAL
  Filled 2016-09-04 (×7): qty 1

## 2016-09-04 MED ORDER — AMLODIPINE BESYLATE 10 MG PO TABS
10.0000 mg | ORAL_TABLET | Freq: Every day | ORAL | Status: DC
  Administered 2016-09-04 – 2016-09-12 (×9): 10 mg via ORAL
  Filled 2016-09-04: qty 1
  Filled 2016-09-04: qty 2
  Filled 2016-09-04 (×7): qty 1

## 2016-09-04 MED ORDER — GUAIFENESIN ER 600 MG PO TB12
600.0000 mg | ORAL_TABLET | Freq: Two times a day (BID) | ORAL | Status: DC | PRN
  Administered 2016-09-04 – 2016-09-11 (×5): 600 mg via ORAL
  Filled 2016-09-04 (×5): qty 1

## 2016-09-04 MED ORDER — DEXTROSE 5 % IV SOLN: 2.0000 g | INTRAVENOUS | Status: DC

## 2016-09-04 MED ORDER — PANTOPRAZOLE SODIUM 40 MG PO TBEC
40.0000 mg | DELAYED_RELEASE_TABLET | Freq: Every day | ORAL | Status: DC
  Administered 2016-09-04 – 2016-09-12 (×9): 40 mg via ORAL
  Filled 2016-09-04 (×10): qty 1

## 2016-09-04 MED ORDER — TAMSULOSIN HCL 0.4 MG PO CAPS
0.4000 mg | ORAL_CAPSULE | Freq: Every evening | ORAL | Status: DC
  Administered 2016-09-04 – 2016-09-11 (×8): 0.4 mg via ORAL
  Filled 2016-09-04 (×8): qty 1

## 2016-09-04 MED ORDER — APIXABAN 5 MG PO TABS
5.0000 mg | ORAL_TABLET | Freq: Two times a day (BID) | ORAL | Status: DC
  Administered 2016-09-04 – 2016-09-06 (×5): 5 mg via ORAL
  Filled 2016-09-04 (×5): qty 1

## 2016-09-04 MED ORDER — EZETIMIBE 10 MG PO TABS
10.0000 mg | ORAL_TABLET | Freq: Every day | ORAL | Status: DC
  Administered 2016-09-04 – 2016-09-12 (×9): 10 mg via ORAL
  Filled 2016-09-04 (×9): qty 1

## 2016-09-04 MED ORDER — SPIRONOLACTONE 25 MG PO TABS
25.0000 mg | ORAL_TABLET | Freq: Every day | ORAL | Status: DC
  Administered 2016-09-04 – 2016-09-05 (×2): 25 mg via ORAL
  Filled 2016-09-04 (×2): qty 1

## 2016-09-04 MED ORDER — FUROSEMIDE 40 MG PO TABS
40.0000 mg | ORAL_TABLET | Freq: Two times a day (BID) | ORAL | Status: DC
  Administered 2016-09-04 – 2016-09-05 (×4): 40 mg via ORAL
  Filled 2016-09-04 (×2): qty 1
  Filled 2016-09-04: qty 2
  Filled 2016-09-04: qty 1

## 2016-09-04 MED ORDER — FUROSEMIDE 20 MG PO TABS: 40.0000 mg | ORAL_TABLET | Freq: Two times a day (BID) | ORAL | Status: DC

## 2016-09-04 NOTE — Progress Notes (Signed)
SATURATION QUALIFICATIONS: (This note is used to comply with regulatory documentation for home oxygen)  Patient Saturations on Room Air at Rest = 83%  Patient Saturations on Room Air while Ambulating = not tested due to low O2 at rest on room air.  Patient Saturations on 2 Liters of oxygen while Ambulating = 92-95%  Patient Saturations on 2 Liters of oxygen at rest = 96%

## 2016-09-04 NOTE — Progress Notes (Signed)
CRITICAL VALUE ALERT  Critical Value:  Lactic Acid 3.8  Date & Time Notied:  09/04/16 1905  Provider Notified: K.Schorr  Orders Received/Actions taken: Re-entered earlier mentioned serial lactic acid checks, awaiting callback for specific orders.     Page: 6L84 lactic acid 3.8, on recheck. will re-enter order for serial lactic q3h per verbal from [facility] unless advised otherwise.

## 2016-09-04 NOTE — Progress Notes (Signed)
Report received from ED 

## 2016-09-04 NOTE — Progress Notes (Signed)
Pharmacy Antibiotic Note  Steven Krause is a 76 y.o. male admitted on 09/04/2016 with pneumonia.  Pharmacy has been consulted for Ceftriaxone and Azithromycin dosing.  BMet is currently pending, however renal adjustment not necessary with these antibiotics.  First doses have already been ordered, but not given.  Blood cultures to be drawn.  Plan: Continue Azithromycin 500mg  IV q24, next dose 7/2 Change Ceftriaxone to 2g IV q24, start today  Height: 5\' 9"  (175.3 cm) Weight: 218 lb (98.9 kg) IBW/kg (Calculated) : 70.7  No data recorded.   Recent Labs Lab 09/04/16 1059  WBC 7.9    CrCl cannot be calculated (Patient's most recent lab result is older than the maximum 21 days allowed.).    Allergies  Allergen Reactions  . Statins Nausea And Vomiting    Antimicrobials this admission: 7/1 Azithromycin >>  7/1 Ceftriaxone >>   Microbiology results: 7/1 BCx:    Thank you for allowing pharmacy to be a part of this patient's care.   Lewie Chamber., PharmD Clinical Pharmacist [facility]

## 2016-09-04 NOTE — H&P (Signed)
History and Physical    Steven Krause FBP:[id number] DOB: [birth year] DOA: 09/04/2016   PCP: Gayland Curry, DO   Patient coming from:  Home    Chief Complaint: Shortness of breath  HPI: Steven Krause is a 76 y.o. male with medical history significant for diastolic heart failure, chronic lower extremity edema, chronic hypokalemia, chronic atrial fibrillation on Eliquis, chronic pipe use, history will hiatal hernia, iron deficiency anemia, prior hospitalization for pneumonia in February of this year, known by middle lung nodule, presenting with the three-week through progressive history of productive cough, with dark sputum, with oh blood, accompanied by intermittent subjective fevers and rigors for which he sought medical attention today. He denies rhinorrhea.  Denies fevers, chills, night sweats or mucositis. Denies any chest pain, chest wall pain or palpitations.Denies any sick contacts or recent long distant travels. Denies any abdominal pain. Appetite is normal. Denies dizziness or vertigo.  No confusion was reported. Denies any vision changes, double vision or headaches.    ED Course:  BP 137/62   Pulse 69   Resp (!) 22   Ht 5\' 9"  (1.753 m)   Wt 98.9 kg (218 lb)   SpO2 93%   BMI 32.19 kg/m   white count 7.9 hemoglobin 11.5 platelets 263 sodium 133 potassium 3.3 bicarb 26 creatinine 0.96 glucose 1 17, Cr 0.89 he received IV Rocephin and Zithromax  initially, his O2 sats were 90 and below, placed on 2 L of oxygen, and his O2 sats are now 94%.  Review of Systems: As per HPI otherwise 10 point review of systems negative.   Past Medical History:  Diagnosis Date  . Abdominal pain, generalized   . Acquired polycythemia 01/05/2011  . Allergy   . Diarrhea   . Dizziness and giddiness   . Hernia of unspecified site of abdominal cavity without mention of obstruction or gangrene   . Hypertension   . Hypopotassemia   . Iron excess 01/05/2011  . Memory loss   . Other specified  cardiac dysrhythmias(427.89)   . 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 Polycythemia, secondary   . Systemic hypertension   . Tobacco use disorder   . Unspecified disorder of kidney and ureter   . Unspecified disorder of skin and subcutaneous tissue   . Unspecified hearing loss     Past Surgical History:  Procedure Laterality Date  . CHOLECYSTECTOMY    . COLONOSCOPY N/A 05/10/2016   Procedure: COLONOSCOPY;  Surgeon: Manus Gunning, MD;  Location: [place];  Service: Gastroenterology;  Laterality: N/A;  . ESOPHAGOGASTRODUODENOSCOPY N/A 05/10/2016   Procedure: ESOPHAGOGASTRODUODENOSCOPY (EGD);  Surgeon: Manus Gunning, MD;  Location: [place];  Service: Gastroenterology;  Laterality: N/A;  . GIVENS CAPSULE STUDY N/A 05/10/2016   Procedure: GIVENS CAPSULE STUDY;  Surgeon: Manus Gunning, MD;  Location: [place];  Service: Gastroenterology;  Laterality: N/A;  . INFLAMED COLON    . LEFT HEART CATH AND CORONARY ANGIOGRAPHY N/A 05/06/2016   Procedure: Left Heart Cath and Coronary Angiography;  Surgeon: Peter M Martinique, MD;  Location: [place] [facility];  Service: Cardiovascular;  Laterality: N/A;  . NM MYOCAR PERF WALL MOTION  8//30/11   normal  . US ECHOCARDIOGRAPHY  11/03/09   EF 50-55%,  . VENTRAL HERNIA REPAIR      Social History Social History   Social History  . Marital status: Single    Spouse name: N/A  . Number of children: N/A  . Years of education: N/A   Occupational History  .  Not on file.   Social History Main Topics  . Smoking status: Former Smoker    Packs/day: 0.25    Years: 55.00    Types: Pipe    Quit date: 05/01/2016  . Smokeless tobacco: Never Used  . Alcohol use No  . Drug use: No  . Sexual activity: Not Currently   Other Topics Concern  . Not on file   Social History Narrative   Friend [name] is contact and helps with his finances, appts, etc.     Allergies  Allergen Reactions  . Statins Nausea And Vomiting    Family  History  Problem Relation Age of Onset  . Colon polyps Mother 62  . Colon cancer Mother   . Heart disease Father   . Diabetes Sister   . Cancer Brother   . Diabetes Sister   . Diabetes Sister   . Heart disease Sister   . Stomach cancer Neg Hx   . Rectal cancer Neg Hx       Prior to Admission medications   Medication Sig Start Date End Date Taking? Authorizing Provider  amLODipine (NORVASC) 10 MG tablet Take 10 mg by mouth daily. 07/31/16  Yes [provider]  apixaban (ELIQUIS) 5 MG TABS tablet Take 1 tablet (5 mg total) by mouth 2 (two) times daily. 08/19/16  Yes Reed, [name] L, DO  ezetimibe (ZETIA) 10 MG tablet Take 1 tablet (10 mg total) by mouth daily. 08/19/16  Yes Reed, [name] L, DO  furosemide (LASIX) 40 MG tablet TAKE 1 TABLET IN THE MORNING AND TAKE 1 TABLET IN THE EVENING 08/19/16  Yes Reed, [name] L, DO  HYDROcodone-acetaminophen (NORCO) 5-325 MG tablet Take 1 tablet by mouth every 6 (six) hours as needed for moderate pain. 04/18/16  Yes Reed, [name] L, DO  iron polysaccharides (NIFEREX) 150 MG capsule Take 1 capsule (150 mg total) by mouth daily. 08/19/16  Yes Reed, [name] L, DO  pantoprazole (PROTONIX) 40 MG tablet Take 1 tablet (40 mg total) by mouth daily at 6 (six) AM. 08/19/16  Yes Reed, [name] L, DO  potassium chloride SA (K-DUR,KLOR-CON) 20 MEQ tablet Take 1 tablet (20 mEq total) by mouth daily. 08/19/16  Yes Reed, [name] L, DO  spironolactone (ALDACTONE) 25 MG tablet Take 1 tablet (25 mg total) by mouth daily. 08/19/16  Yes Reed, [name] L, DO  tamsulosin (FLOMAX) 0.4 MG CAPS capsule Take 1 capsule (0.4 mg total) by mouth daily. 08/19/16  Yes Gayland Curry, DO    Physical Exam:  Vitals:   09/04/16 1145 09/04/16 1200 09/04/16 1215 09/04/16 1230  BP: (!) 143/71 131/67 (!) 141/83 137/62  Pulse:      Resp:      TempSrc:      SpO2: 93% 93% 94% 93%  Weight:      Height:       Constitutional: NAD, calm, comfortable with continuous O2 Eyes: PERRL, lids  and conjunctivae normal ENMT: Mucous membranes are moist, without exudate or lesions  Neck: normal, supple, no masses, no thyromegaly, no JVD  Respiratory: LUL wheezing, rhonchi,  no crackles. Normal respiratory effort  Cardiovascular: Regular rate and rhythm, no murmurs, rubs or gallops. Bilateral chronic 1-2+ bilateral lower  extremity edema. 2+ pedal pulses. No carotid bruits.  Abdomen: Soft, obese, non tender, No hepatosplenomegaly. Bowel sounds positive.  Musculoskeletal: no clubbing / cyanosis. Moves all extremities Skin: no jaundice, No lesions. Dry.   Neurologic: Sensation intact  Strength equal in all extremities Psychiatric:   Alert  and oriented x 3. Normal mood.     Labs on Admission: I have personally reviewed following labs and imaging studies  CBC:  Recent Labs Lab 09/04/16 1059  WBC 7.9  NEUTROABS 6.7  HGB 11.5*  HCT 37.5*  MCV 81.0  PLT 628    Basic Metabolic Panel:  Recent Labs Lab 09/04/16 1059  NA 133*  K 3.3*  CL 96*  CO2 26  GLUCOSE 117*  BUN 13  CREATININE 0.96  CALCIUM 8.9    GFR: Estimated Creatinine Clearance: 77.1 mL/min (by C-G formula based on SCr of 0.96 mg/dL).  Liver Function Tests: No results for input(s): AST, ALT, ALKPHOS, BILITOT, PROT, ALBUMIN in the last 168 hours. No results for input(s): LIPASE, AMYLASE in the last 168 hours. No results for input(s): AMMONIA in the last 168 hours.  Coagulation Profile: No results for input(s): INR, PROTIME in the last 168 hours.  Cardiac Enzymes: No results for input(s): CKTOTAL, CKMB, CKMBINDEX, TROPONINI in the last 168 hours.  BNP (last 3 results) No results for input(s): PROBNP in the last 8760 hours.  HbA1C: No results for input(s): HGBA1C in the last 72 hours.  CBG: No results for input(s): GLUCAP in the last 168 hours.  Lipid Profile: No results for input(s): CHOL, HDL, LDLCALC, TRIG, CHOLHDL, LDLDIRECT in the last 72 hours.  Thyroid Function Tests: No results for  input(s): TSH, T4TOTAL, FREET4, T3FREE, THYROIDAB in the last 72 hours.  Anemia Panel: No results for input(s): VITAMINB12, FOLATE, FERRITIN, TIBC, IRON, RETICCTPCT in the last 72 hours.  Urine analysis:    Component Value Date/Time   BILIRUBINUR Neg 12/16/2013 1328   PROTEINUR Trace 12/16/2013 1328   UROBILINOGEN 0.2 12/16/2013 1328   NITRITE Neg 12/16/2013 1328   LEUKOCYTESUR Negative 12/16/2013 1328    Sepsis Labs: @LABRCNTIP (procalcitonin:4,lacticidven:4) )No results found for this or any previous visit (from the past 240 hour(s)).   Radiological Exams on Admission: Dg Chest 2 View  Result Date: 09/04/2016 CLINICAL DATA:  Productive cough and fever. EXAM: CHEST  2 VIEW COMPARISON:  05/04/2016 FINDINGS: Heart size is normal. Parenchymal consolidation is seen in the anterior right upper lobe which is new since prior study. This is suspicious for pneumonia. No evidence of pleural effusion. Mild scarring in right lung base is stable since previous exam. IMPRESSION: New parenchymal consolidation in the anterior right upper lobe, suspicious for pneumonia. Followup PA and lateral chest X-ray is recommended in 3-4 weeks following trial of antibiotic therapy to ensure resolution and exclude underlying malignancy. Electronically Signed   By: Earle Gell M.D.   On: 09/04/2016 10:47    EKG: Independently reviewed.  Assessment/Plan Active Problems:   CAP (community acquired pneumonia)   Acquired polycythemia   Tobacco use disorder   Hypopotassemia   Obesity, unspecified   Chronic atrial fibrillation (HCC)   Ventricular bigeminy   Hyperglycemia   Overweight (BMI 25.0-29.9)   Essential hypertension   Hyperlipidemia   Anemia   Pneumonia   Acute respiratory failure with hypoxia (HCC)   Mass of right lung   Diastolic heart failure (HCC)   Hyponatremia    Acute Respiratory Failure with Hypoxia likely due to pneumonia, possible post obstructive (Known RML mass, see below) Presenting  now with ongoing / progressive symptoms including productive cough,subjective fever  CXR today suggests R lung infiltrate   WBC 7.9  Lactic acid  Bicarb 26  . VSS, Afebrile.  Tele obs  Pneumonia order set  Oxygen   sputum cultures  IV  antibiotics with per protocol with Zithromax and Rocephin  Nebulizers as needed, with Duoneb q 4prn Mucinex prn  Antipyretics prn Repeat CBC in am Incentive spirometry   Diastolic CHF: weight 984, CXR without pleural effusion . Last 2D echo March 2018 showed EF 60 to 21%, normal systolic function, and grade 2 diastolic dysfunction. Continue Lasix  Obtain daily weights Monitor intake and output   RML Lung mass, known. Initially found to have a 2.3 cm RML mass with large mediastinal and hilar adenopathy in February of this year.Marland Kitchen He did not follow-up with appointment in March 2018 to the transportation difficulty. Prior to discharge, the patient will need another appointment to be set, Dr. Melvyn Novas, for possible bronchoscopy in outpatient basis.  Hypokalemia, chronic  Current K  3.3  Oral replenishment Repeat CMET in am  Hyponatremia, in the setting of diuretics and possible malignancy. Current NA 133 at baseline (128-133). Asymptomatic   Continue to carefully monitor with BMET  If SIADH suspected, will proceed with further workup   Hypertension BP 137/62   Pulse 69   Resp (!) 22   Ht 5\' 9"  (1.753 m)   Wt 98.9 kg (218 lb)   SpO2 93%   BMI 32.19 kg/m  Controlled Continue home anti-hypertensive medications   Hyperlipidemia Continue home Zetia  Atrial Fibrillation   on anticoagulation with Eliquis  Rate controlled Continue meds  Anemia of iron deficiency  Hemoglobin on admission 11.5  Repeat CBC in am  No transfusion is indicated at this time Continue Iron supplements   Tobacco abuse     Nicotine patch vs gum Counseled cessation, patient denies plans to quit  Hyperglycemia,  Current blood sugar level is 117 stable  Lab Results    Component Value Date   HGBA1C 5.7 (H) 12/25/2015  Hgb A1C Monitor CBG    DVT prophylaxis: Eliquis  Code Status:   Full    Family Communication:  Discussed with patient Disposition Plan: Expect patient to be discharged to home after condition improves Consults called:    None Admission status:Tele  Obs     Havannah Streat E, PA-C [facility]   09/04/2016, 12:45 PM

## 2016-09-04 NOTE — Progress Notes (Addendum)
CRITICAL VALUE ALERT  Critical Value:  Lactic Acid 4.6  Date & Time Notied:  09/04/16 4:02 PM  Provider Notified: S.Shawn Route, PA (text page)  Orders Received/Actions taken: awaiting callback.   Addendum: Per S.Wertman,PA we will complete serial rechecks of lactic acid for now.  Order for change of IVF rate noted, promptly after call. Adjustment made.

## 2016-09-04 NOTE — Progress Notes (Signed)
Lactic acid 2.5, was 3.8, MD notified, will continue to monitor, Thanks Arvella Nigh RN.

## 2016-09-04 NOTE — ED Triage Notes (Signed)
Pt reports a productive cough for one day and SHOB. Pt lives alone in an apt. And reports increased SHOB with activity.

## 2016-09-04 NOTE — ED Provider Notes (Signed)
Emergency Department Provider Note   I have reviewed the triage vital signs and the nursing notes.   HISTORY  Chief Complaint Shortness of Breath   HPI Steven Krause is a 76 y.o. male presents to the emergency department for evaluation of productive cough for the past 2 weeks hence worsening. He reports subjective fevers and shaking chills last night it kept him from sleeping. No blood in the sputum. No vomiting or nausea. He has chest pain only with coughing but not at rest. No exertional or pleuritic symptoms. Denies any abdominal pain or diarrhea. No sick contacts. He does not smoke cigarettes but does smoke a pipe frequently. Not on home O2.    Past Medical History:  Diagnosis Date  . Abdominal pain, generalized   . Acquired polycythemia 01/05/2011  . Allergy   . Diarrhea   . Dizziness and giddiness   . Hernia of unspecified site of abdominal cavity without mention of obstruction or gangrene   . Hypertension   . Hypopotassemia   . Iron excess 01/05/2011  . Memory loss   . Other specified cardiac dysrhythmias(427.89)   . 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 Polycythemia, secondary   . Systemic hypertension   . Tobacco use disorder   . Unspecified disorder of kidney and ureter   . Unspecified disorder of skin and subcutaneous tissue   . Unspecified hearing loss     Patient Active Problem List   Diagnosis Date Noted  . Pneumonia 09/04/2016  . Acute respiratory failure with hypoxia ([place]) 09/04/2016  . Mass of right lung 09/04/2016  . Diastolic heart failure ([place]) 09/04/2016  . Hyponatremia 09/04/2016  . Persistent atrial fibrillation ([place])   . Heme positive stool   . Iron deficiency anemia   . CAP (community acquired pneumonia) 05/04/2016  . Chest pain 05/04/2016  . Community acquired pneumonia 05/04/2016  . Acute CHF (congestive heart failure) ([place]) 05/04/2016  . Anemia 04/23/2016  . Blurry vision, right eye 04/23/2016  . Hyperlipidemia 10/19/2015  . Essential  hypertension 09/02/2013  . Polycythemia, secondary 09/02/2013  . Hyperglycemia 05/27/2013  . Overweight (BMI 25.0-29.9) 05/27/2013  . Leg edema, left 02/19/2013  . Chronic atrial fibrillation ([place]) 02/14/2013  . Ventricular bigeminy 02/14/2013  . Obesity, unspecified 08/27/2012  . Urinary hesitancy 08/27/2012  . Memory loss   . Tobacco use disorder   . Hypopotassemia   . Acquired polycythemia 01/05/2011  . Iron excess 01/05/2011    Past Surgical History:  Procedure Laterality Date  . CHOLECYSTECTOMY    . COLONOSCOPY N/A 05/10/2016   Procedure: COLONOSCOPY;  Surgeon: Manus Gunning, MD;  Location: [facility];  Service: Gastroenterology;  Laterality: N/A;  . ESOPHAGOGASTRODUODENOSCOPY N/A 05/10/2016   Procedure: ESOPHAGOGASTRODUODENOSCOPY (EGD);  Surgeon: Manus Gunning, MD;  Location: [facility];  Service: Gastroenterology;  Laterality: N/A;  . GIVENS CAPSULE STUDY N/A 05/10/2016   Procedure: GIVENS CAPSULE STUDY;  Surgeon: Manus Gunning, MD;  Location: [facility];  Service: Gastroenterology;  Laterality: N/A;  . INFLAMED COLON    . LEFT HEART CATH AND CORONARY ANGIOGRAPHY N/A 05/06/2016   Procedure: Left Heart Cath and Coronary Angiography;  Surgeon: Peter M Martinique, MD;  Location: [facility];  Service: Cardiovascular;  Laterality: N/A;  . NM MYOCAR PERF WALL MOTION  8//30/11   normal  . US ECHOCARDIOGRAPHY  11/03/09   EF 50-55%,  . VENTRAL HERNIA REPAIR        Allergies Statins  Family History  Problem Relation Age of Onset  .  Colon polyps Mother 48  . Colon cancer Mother   . Heart disease Father   . Diabetes Sister   . Cancer Brother   . Diabetes Sister   . Diabetes Sister   . Heart disease Sister   . Stomach cancer Neg Hx   . Rectal cancer Neg Hx     Social History Social History  Substance Use Topics  . Smoking status: Former Smoker    Packs/day: 0.25    Years: 55.00    Types: Pipe    Quit date: 05/01/2016  . Smokeless  tobacco: Never Used  . Alcohol use No    Review of Systems  Constitutional: No fever. Positive chills.  Eyes: No visual changes. ENT: No sore throat. Cardiovascular: Denies chest pain. Respiratory: Positive shortness of breath and cough.  Gastrointestinal: No abdominal pain.  No nausea, no vomiting.  No diarrhea.  No constipation. Genitourinary: Negative for dysuria. Musculoskeletal: Negative for back pain. Skin: Negative for rash. Neurological: Negative for headaches, focal weakness or numbness.  10-point ROS otherwise negative.  ____________________________________________   PHYSICAL EXAM:  VITAL SIGNS: ED Triage Vitals  Enc Vitals Group     BP 09/04/16 0938 139/82     Pulse Rate 09/04/16 0938 70     Resp 09/04/16 0938 (!) 22     Temp --      Temp Source 09/04/16 0938 Oral     SpO2 09/04/16 0938 90 %     Weight 09/04/16 0939 218 lb (98.9 kg)     Height 09/04/16 0939 5\' 9"  (1.753 m)   Constitutional: Alert and oriented. Well appearing and in no acute distress. Eyes: Conjunctivae are normal.  Head: Atraumatic. Nose: No congestion/rhinnorhea. Mouth/Throat: Mucous membranes are moist.  Neck: No stridor.  Cardiovascular: Normal rate, regular rhythm. Good peripheral circulation. Grossly normal heart sounds.   Respiratory: Increased respiratory effort. No retractions. Lungs with course sounds throughout.  Gastrointestinal: Soft and nontender. No distention.  Musculoskeletal: No lower extremity tenderness nor edema. No gross deformities of extremities. Neurologic:  Normal speech and language. No gross focal neurologic deficits are appreciated.  Skin:  Skin is warm, dry and intact. No rash noted.  ____________________________________________   LABS (all labs ordered are listed, but only abnormal results are displayed)  Labs Reviewed  BASIC METABOLIC PANEL - Abnormal; Notable for the following:       Result Value   Sodium 133 (*)    Potassium 3.3 (*)    Chloride 96  (*)    Glucose, Bld 117 (*)    All other components within normal limits  CBC WITH DIFFERENTIAL/PLATELET - Abnormal; Notable for the following:    Hemoglobin 11.5 (*)    HCT 37.5 (*)    MCH 24.8 (*)    RDW 19.1 (*)    All other components within normal limits  LACTIC ACID, PLASMA - Abnormal; Notable for the following:    Lactic Acid, Venous 4.6 (*)    All other components within normal limits  LACTIC ACID, PLASMA - Abnormal; Notable for the following:    Lactic Acid, Venous 3.8 (*)    All other components within normal limits  CULTURE, BLOOD (ROUTINE X 2)  CULTURE, BLOOD (ROUTINE X 2)  CULTURE, EXPECTORATED SPUTUM-ASSESSMENT  GRAM STAIN  URINE CULTURE  STREP PNEUMONIAE URINARY ANTIGEN  LEGIONELLA PNEUMOPHILA SEROGP 1 UR AG  COMPREHENSIVE METABOLIC PANEL  CBC  I-STAT TROPOININ, ED   ____________________________________________  EKG   EKG Interpretation  Date/Time:  Sunday September 04 2016  09:48:18 EDT Ventricular Rate:  72 PR Interval:    QRS Duration: 82 QT Interval:  429 QTC Calculation: 470 R Axis:   78 Text Interpretation:  Atrial fibrillation Ventricular premature complex Borderline repolarization abnormality No STEMI.  Confirmed by Nanda Quinton [phone]) on 09/04/2016 9:53:38 AM       ____________________________________________  RADIOLOGY  Dg Chest 2 View  Result Date: 09/04/2016 CLINICAL DATA:  Productive cough and fever. EXAM: CHEST  2 VIEW COMPARISON:  05/04/2016 FINDINGS: Heart size is normal. Parenchymal consolidation is seen in the anterior right upper lobe which is new since prior study. This is suspicious for pneumonia. No evidence of pleural effusion. Mild scarring in right lung base is stable since previous exam. IMPRESSION: New parenchymal consolidation in the anterior right upper lobe, suspicious for pneumonia. Followup PA and lateral chest X-ray is recommended in 3-4 weeks following trial of antibiotic therapy to ensure resolution and exclude underlying  malignancy. Electronically Signed   By: Earle Gell M.D.   On: 09/04/2016 10:47    ____________________________________________   PROCEDURES  Procedure(s) performed:   Procedures   ____________________________________________   INITIAL IMPRESSION / ASSESSMENT AND PLAN / ED COURSE  Pertinent labs & imaging results that were available during my care of the patient were reviewed by me and considered in my medical decision making (see chart for details).  Patient presents to the emergency department for evaluation of increasing productive cough and subjective fevers and chills that started last night. He is slightly hypoxemic with O2 sat on room air of 90%. Slight increased work of breathing. No wheezing. He has coarse lung sounds bilaterally but nothing focal. Plan for chest x-ray, cardiac evaluation, EKG and reassess.   ____________________________________________  FINAL CLINICAL IMPRESSION(S) / ED DIAGNOSES  Final diagnoses:  Community acquired pneumonia of right middle lobe of lung ([place])  Hypoxia     MEDICATIONS GIVEN DURING THIS VISIT:  Medications  azithromycin (ZITHROMAX) 500 mg in dextrose 5 % 250 mL IVPB (not administered)  cefTRIAXone (ROCEPHIN) 1 g in dextrose 5 % 50 mL IVPB (0 g Intravenous Stopped 09/04/16 1310)  amLODipine (NORVASC) tablet 10 mg (10 mg Oral Given 09/04/16 1318)  apixaban (ELIQUIS) tablet 5 mg (5 mg Oral Given 09/04/16 1426)  ezetimibe (ZETIA) tablet 10 mg (10 mg Oral Given 09/04/16 1426)  iron polysaccharides (NIFEREX) capsule 150 mg (150 mg Oral Given 09/04/16 1426)  pantoprazole (PROTONIX) EC tablet 40 mg (40 mg Oral Given 09/04/16 1319)  potassium chloride SA (K-DUR,KLOR-CON) CR tablet 20 mEq (20 mEq Oral Given 09/04/16 1318)  spironolactone (ALDACTONE) tablet 25 mg (25 mg Oral Given 09/04/16 1426)  tamsulosin (FLOMAX) capsule 0.4 mg (0.4 mg Oral Given 09/04/16 1753)  HYDROcodone-acetaminophen (NORCO/VICODIN) 5-325 MG per tablet 1 tablet (not administered)    0.9 %  sodium chloride infusion ( Intravenous New Bag/Given 09/04/16 1612)  guaiFENesin (MUCINEX) 12 hr tablet 600 mg (not administered)  ipratropium-albuterol (DUONEB) 0.5-2.5 (3) MG/3ML nebulizer solution 3 mL (3 mLs Nebulization Given 09/04/16 1319)  furosemide (LASIX) tablet 40 mg (40 mg Oral Given 09/04/16 1754)  MEDLINE mouth rinse (15 mLs Mouth Rinse Given 09/04/16 1616)  azithromycin (ZITHROMAX) 500 mg in dextrose 5 % 250 mL IVPB (0 mg Intravenous Stopped 09/04/16 1423)     NEW OUTPATIENT MEDICATIONS STARTED DURING THIS VISIT:  None   Note:  This document was prepared using Dragon voice recognition software and may include unintentional dictation errors.  Nanda Quinton, MD Emergency Medicine  [facility], [facility], MD 09/04/16 [phone]

## 2016-09-05 ENCOUNTER — Observation Stay (HOSPITAL_COMMUNITY): Payer: Medicare Other

## 2016-09-05 DIAGNOSIS — R0602 Shortness of breath: Secondary | ICD-10-CM | POA: Diagnosis present

## 2016-09-05 DIAGNOSIS — I5032 Chronic diastolic (congestive) heart failure: Secondary | ICD-10-CM | POA: Diagnosis not present

## 2016-09-05 DIAGNOSIS — I10 Essential (primary) hypertension: Secondary | ICD-10-CM | POA: Diagnosis not present

## 2016-09-05 DIAGNOSIS — J440 Chronic obstructive pulmonary disease with acute lower respiratory infection: Secondary | ICD-10-CM | POA: Diagnosis present

## 2016-09-05 DIAGNOSIS — Z9889 Other specified postprocedural states: Secondary | ICD-10-CM | POA: Diagnosis not present

## 2016-09-05 DIAGNOSIS — R008 Other abnormalities of heart beat: Secondary | ICD-10-CM | POA: Diagnosis present

## 2016-09-05 DIAGNOSIS — R05 Cough: Secondary | ICD-10-CM | POA: Diagnosis not present

## 2016-09-05 DIAGNOSIS — J9601 Acute respiratory failure with hypoxia: Secondary | ICD-10-CM | POA: Diagnosis present

## 2016-09-05 DIAGNOSIS — R590 Localized enlarged lymph nodes: Secondary | ICD-10-CM | POA: Diagnosis not present

## 2016-09-05 DIAGNOSIS — J189 Pneumonia, unspecified organism: Secondary | ICD-10-CM | POA: Diagnosis present

## 2016-09-05 DIAGNOSIS — E876 Hypokalemia: Secondary | ICD-10-CM | POA: Diagnosis present

## 2016-09-05 DIAGNOSIS — C801 Malignant (primary) neoplasm, unspecified: Secondary | ICD-10-CM | POA: Diagnosis not present

## 2016-09-05 DIAGNOSIS — C3411 Malignant neoplasm of upper lobe, right bronchus or lung: Secondary | ICD-10-CM | POA: Diagnosis not present

## 2016-09-05 DIAGNOSIS — J90 Pleural effusion, not elsewhere classified: Secondary | ICD-10-CM | POA: Diagnosis present

## 2016-09-05 DIAGNOSIS — E871 Hypo-osmolality and hyponatremia: Secondary | ICD-10-CM | POA: Diagnosis present

## 2016-09-05 DIAGNOSIS — R918 Other nonspecific abnormal finding of lung field: Secondary | ICD-10-CM | POA: Diagnosis not present

## 2016-09-05 DIAGNOSIS — I5033 Acute on chronic diastolic (congestive) heart failure: Secondary | ICD-10-CM | POA: Diagnosis present

## 2016-09-05 DIAGNOSIS — J9811 Atelectasis: Secondary | ICD-10-CM | POA: Diagnosis present

## 2016-09-05 DIAGNOSIS — Z66 Do not resuscitate: Secondary | ICD-10-CM | POA: Diagnosis present

## 2016-09-05 DIAGNOSIS — I6300 Cerebral infarction due to thrombosis of unspecified precerebral artery: Secondary | ICD-10-CM | POA: Diagnosis not present

## 2016-09-05 DIAGNOSIS — Z7189 Other specified counseling: Secondary | ICD-10-CM | POA: Diagnosis not present

## 2016-09-05 DIAGNOSIS — R739 Hyperglycemia, unspecified: Secondary | ICD-10-CM | POA: Diagnosis present

## 2016-09-05 DIAGNOSIS — C787 Secondary malignant neoplasm of liver and intrahepatic bile duct: Secondary | ICD-10-CM | POA: Diagnosis not present

## 2016-09-05 DIAGNOSIS — I110 Hypertensive heart disease with heart failure: Secondary | ICD-10-CM | POA: Diagnosis present

## 2016-09-05 DIAGNOSIS — D751 Secondary polycythemia: Secondary | ICD-10-CM

## 2016-09-05 DIAGNOSIS — E669 Obesity, unspecified: Secondary | ICD-10-CM | POA: Diagnosis present

## 2016-09-05 DIAGNOSIS — Z6831 Body mass index (BMI) 31.0-31.9, adult: Secondary | ICD-10-CM | POA: Diagnosis not present

## 2016-09-05 DIAGNOSIS — E785 Hyperlipidemia, unspecified: Secondary | ICD-10-CM | POA: Diagnosis present

## 2016-09-05 DIAGNOSIS — K219 Gastro-esophageal reflux disease without esophagitis: Secondary | ICD-10-CM | POA: Diagnosis present

## 2016-09-05 DIAGNOSIS — D509 Iron deficiency anemia, unspecified: Secondary | ICD-10-CM | POA: Diagnosis present

## 2016-09-05 DIAGNOSIS — Z7901 Long term (current) use of anticoagulants: Secondary | ICD-10-CM | POA: Diagnosis not present

## 2016-09-05 DIAGNOSIS — J181 Lobar pneumonia, unspecified organism: Secondary | ICD-10-CM | POA: Diagnosis not present

## 2016-09-05 DIAGNOSIS — R0609 Other forms of dyspnea: Secondary | ICD-10-CM | POA: Diagnosis not present

## 2016-09-05 DIAGNOSIS — Z79899 Other long term (current) drug therapy: Secondary | ICD-10-CM | POA: Diagnosis not present

## 2016-09-05 DIAGNOSIS — I482 Chronic atrial fibrillation: Secondary | ICD-10-CM | POA: Diagnosis present

## 2016-09-05 DIAGNOSIS — R911 Solitary pulmonary nodule: Secondary | ICD-10-CM | POA: Diagnosis present

## 2016-09-05 DIAGNOSIS — R899 Unspecified abnormal finding in specimens from other organs, systems and tissues: Secondary | ICD-10-CM | POA: Diagnosis not present

## 2016-09-05 DIAGNOSIS — J441 Chronic obstructive pulmonary disease with (acute) exacerbation: Secondary | ICD-10-CM | POA: Diagnosis present

## 2016-09-05 DIAGNOSIS — I5030 Unspecified diastolic (congestive) heart failure: Secondary | ICD-10-CM | POA: Diagnosis not present

## 2016-09-05 DIAGNOSIS — Z515 Encounter for palliative care: Secondary | ICD-10-CM | POA: Diagnosis present

## 2016-09-05 DIAGNOSIS — R0902 Hypoxemia: Secondary | ICD-10-CM | POA: Diagnosis not present

## 2016-09-05 LAB — EXPECTORATED SPUTUM ASSESSMENT W REFEX TO RESP CULTURE

## 2016-09-05 LAB — COMPREHENSIVE METABOLIC PANEL
ALK PHOS: 228 U/L — AB (ref 38–126)
ALT: 41 U/L (ref 17–63)
ANION GAP: 6 (ref 5–15)
AST: 134 U/L — ABNORMAL HIGH (ref 15–41)
Albumin: 2.7 g/dL — ABNORMAL LOW (ref 3.5–5.0)
BILIRUBIN TOTAL: 0.7 mg/dL (ref 0.3–1.2)
BUN: 12 mg/dL (ref 6–20)
CO2: 30 mmol/L (ref 22–32)
CREATININE: 0.92 mg/dL (ref 0.61–1.24)
Calcium: 8.1 mg/dL — ABNORMAL LOW (ref 8.9–10.3)
Chloride: 98 mmol/L — ABNORMAL LOW (ref 101–111)
Glucose, Bld: 139 mg/dL — ABNORMAL HIGH (ref 65–99)
Potassium: 3.2 mmol/L — ABNORMAL LOW (ref 3.5–5.1)
Sodium: 134 mmol/L — ABNORMAL LOW (ref 135–145)
TOTAL PROTEIN: 6.8 g/dL (ref 6.5–8.1)

## 2016-09-05 LAB — CBC
HCT: 34.6 % — ABNORMAL LOW (ref 39.0–52.0)
Hemoglobin: 10.4 g/dL — ABNORMAL LOW (ref 13.0–17.0)
MCH: 24.6 pg — AB (ref 26.0–34.0)
MCHC: 30.1 g/dL (ref 30.0–36.0)
MCV: 81.8 fL (ref 78.0–100.0)
PLATELETS: 259 10*3/uL (ref 150–400)
RBC: 4.23 MIL/uL (ref 4.22–5.81)
RDW: 19.3 % — ABNORMAL HIGH (ref 11.5–15.5)
WBC: 7.9 10*3/uL (ref 4.0–10.5)

## 2016-09-05 LAB — URINE CULTURE

## 2016-09-05 LAB — EXPECTORATED SPUTUM ASSESSMENT W GRAM STAIN, RFLX TO RESP C

## 2016-09-05 LAB — LEGIONELLA PNEUMOPHILA SEROGP 1 UR AG: L. pneumophila Serogp 1 Ur Ag: NEGATIVE

## 2016-09-05 LAB — LACTIC ACID, PLASMA
LACTIC ACID, VENOUS: 1.9 mmol/L (ref 0.5–1.9)
LACTIC ACID, VENOUS: 2.5 mmol/L — AB (ref 0.5–1.9)
Lactic Acid, Venous: 1.7 mmol/L (ref 0.5–1.9)

## 2016-09-05 MED ORDER — DEXTROSE 5 % IV SOLN
2.0000 g | INTRAVENOUS | Status: DC
  Administered 2016-09-05: 2 g via INTRAVENOUS
  Filled 2016-09-05 (×2): qty 2

## 2016-09-05 MED ORDER — POTASSIUM CHLORIDE CRYS ER 20 MEQ PO TBCR: 40.0000 meq | EXTENDED_RELEASE_TABLET | Freq: Once | ORAL | Status: DC

## 2016-09-05 MED ORDER — SODIUM CHLORIDE 0.9 % IV BOLUS (SEPSIS)
500.0000 mL | Freq: Once | INTRAVENOUS | Status: AC
  Administered 2016-09-05: 500 mL via INTRAVENOUS

## 2016-09-05 MED ORDER — POTASSIUM CHLORIDE CRYS ER 20 MEQ PO TBCR
20.0000 meq | EXTENDED_RELEASE_TABLET | Freq: Once | ORAL | Status: AC
  Administered 2016-09-05: 20 meq via ORAL
  Filled 2016-09-05: qty 1

## 2016-09-05 NOTE — Progress Notes (Signed)
Lactic acid 2.5, K 3.2, MD notified, will continue to monitor, Thanks Arvella Nigh RN.

## 2016-09-05 NOTE — Progress Notes (Signed)
[facility] PROGRESS NOTE    Progress Note  Cheston Coury  GGE:[id number] DOB: Oct 21, 1940 DOA: 09/04/2016 PCP: Gayland Curry, DO     Brief Narrative:   Kabir Brannock is an 76 y.o. male past medical history significant for chronic diastolic heart failure, atrial fibrillation on Eluquis iron deficiency anemia recently hospitalized on February for pneumonia and a known middle lung nodule comes in for progressive cough that started 3 weeks prior to admission  Assessment/Plan:   Acute respiratory failure with hypoxia ([place]) Likely due to pneumonia possibly postobstructive with a right upper lobe mass history: I agree with any empiric antibiotics Rocephin and azithromycin. Has remained afebrile with no leukocytosis. consult oncology.  Right upper lobe mass: He did not follow-up with his pulmonology appointment. Consult oncology  Hyperkalemia: Repleted orally now resolved.  Mild hyponatremia: In the setting of pneumonia and possible malignancy. Currently asymptomatic.  Chronic diastolic heart failure: Continue strict I's and O's daily weights. Continue Lasix seems to be euvolemic.  Hyperlipidemia: Continue current regimen.  Chronic atrial fibrillation: Rate control continue liquids.  Iron deficiency anemia: Hemoglobin stable.  Hypokalemia: Replete orally.  Tobacco use disorder  Obesity, unspecified  DVT prophylaxis: Eluquis Family Communication:none Disposition Plan/Barrier to D/C: unable to determine Code Status:     Code Status Orders        Start     Ordered   09/04/16 1205  Full code  Continuous     09/04/16 1210    Code Status History    Date Active Date Inactive Code Status Order ID Comments User Context   05/04/2016  6:54 PM 05/12/2016  2:08 PM Full Code [id number]  Mendel Corning, MD Inpatient        IV Access:    Peripheral IV   Procedures and diagnostic studies:   Dg Chest 2 View  Result Date: 09/05/2016 CLINICAL DATA:   Pneumonia with shortness of breath EXAM: CHEST  2 VIEW COMPARISON:  September 04, 2016 chest radiograph and chest CT May 04, 2016 FINDINGS: There is persistent airspace consolidation in the anterior segment right upper lobe with without appreciable change from 1 day prior. There is new focal airspace opacity in the lateral right base with minimal right pleural effusion. Left lung is clear. Heart is upper normal in size with mild pulmonary venous hypertension. No adenopathy evident. No bone lesions. IMPRESSION: Extensive airspace consolidation anterior segment right upper lobe, stable from 1 day prior and consistent with pneumonia. New focal presumed pneumonia lateral right base. New small right pleural effusion. Left lung unchanged. Stable cardiac silhouette with a degree of pulmonary vascular congestion. Followup PA and lateral chest radiographs recommended in 3-4 weeks following trial of antibiotic therapy to ensure resolution and exclude underlying malignancy. Electronically Signed   By: Lowella Grip III M.D.   On: 09/05/2016 07:49   Dg Chest 2 View  Result Date: 09/04/2016 CLINICAL DATA:  Productive cough and fever. EXAM: CHEST  2 VIEW COMPARISON:  05/04/2016 FINDINGS: Heart size is normal. Parenchymal consolidation is seen in the anterior right upper lobe which is new since prior study. This is suspicious for pneumonia. No evidence of pleural effusion. Mild scarring in right lung base is stable since previous exam. IMPRESSION: New parenchymal consolidation in the anterior right upper lobe, suspicious for pneumonia. Followup PA and lateral chest X-ray is recommended in 3-4 weeks following trial of antibiotic therapy to ensure resolution and exclude underlying malignancy. Electronically Signed   By: Earle Gell M.D.   On: 09/04/2016  10:47     Medical Consultants:    None.  Anti-Infectives:   Rocephin and azithro  Subjective:    [name] he relates no shortness of breath is unchanged  from prior.  Objective:    Vitals:   09/04/16 2335 09/05/16 0109 09/05/16 0418 09/05/16 0858  BP: 125/68  133/75 (!) 141/81  Pulse: 69  62 63  Resp: 18  19 18   Temp: 97.7 F (36.5 C)  97.6 F (36.4 C) 97.5 F (36.4 C)  TempSrc: Oral  Oral Oral  SpO2: 95%  93% 93%  Weight:  92.3 kg (203 lb 6.4 oz)    Height:        Intake/Output Summary (Last 24 hours) at 09/05/16 1048 Last data filed at 09/05/16 0948  Gross per 24 hour  Intake          3474.17 ml  Output             1525 ml  Net          1949.17 ml   Filed Weights   09/04/16 0939 09/04/16 1445 09/05/16 0109  Weight: 98.9 kg (218 lb) 91.5 kg (201 lb 11.2 oz) 92.3 kg (203 lb 6.4 oz)    Exam: General exam: In no acute distress. Respiratory system: Moderate air movement with crackles on the right upper lung Cardiovascular system: Regular rate and rhythm with positive S1 and S2 Gastrointestinal system: Positive bowel sounds soft nontender nondistended Central nervous system: Awake alert and oriented 3, no focal deficits. Extremities: No pedal edema. Skin: No rashes. Psychiatry: Mood and affect appear appropriate, judgment and insight appear normal.   Data Reviewed:    Labs: Basic Metabolic Panel:  Recent Labs Lab 09/04/16 1059 09/05/16 0354  NA 133* 134*  K 3.3* 3.2*  CL 96* 98*  CO2 26 30  GLUCOSE 117* 139*  BUN 13 12  CREATININE 0.96 0.92  CALCIUM 8.9 8.1*   GFR Estimated Creatinine Clearance: 77.8 mL/min (by C-G formula based on SCr of 0.92 mg/dL). Liver Function Tests:  Recent Labs Lab 09/05/16 0354  AST 134*  ALT 41  ALKPHOS 228*  BILITOT 0.7  PROT 6.8  ALBUMIN 2.7*   No results for input(s): LIPASE, AMYLASE in the last 168 hours. No results for input(s): AMMONIA in the last 168 hours. Coagulation profile No results for input(s): INR, PROTIME in the last 168 hours.  CBC:  Recent Labs Lab 09/04/16 1059 09/05/16 0354  WBC 7.9 7.9  NEUTROABS 6.7  --   HGB 11.5* 10.4*  HCT 37.5*  34.6*  MCV 81.0 81.8  PLT 263 259   Cardiac Enzymes: No results for input(s): CKTOTAL, CKMB, CKMBINDEX, TROPONINI in the last 168 hours. BNP (last 3 results) No results for input(s): PROBNP in the last 8760 hours. CBG: No results for input(s): GLUCAP in the last 168 hours. D-Dimer: No results for input(s): DDIMER in the last 72 hours. Hgb A1c: No results for input(s): HGBA1C in the last 72 hours. Lipid Profile: No results for input(s): CHOL, HDL, LDLCALC, TRIG, CHOLHDL, LDLDIRECT in the last 72 hours. Thyroid function studies: No results for input(s): TSH, T4TOTAL, T3FREE, THYROIDAB in the last 72 hours.  Invalid input(s): FREET3 Anemia work up: No results for input(s): VITAMINB12, FOLATE, FERRITIN, TIBC, IRON, RETICCTPCT in the last 72 hours. Sepsis Labs:  Recent Labs Lab 09/04/16 1059  09/04/16 2158 09/05/16 0055 09/05/16 0354 09/05/16 0652  WBC 7.9  --   --   --  7.9  --  LATICACIDVEN  --   < > 2.5* 1.9 2.5* 1.7  < > = values in this interval not displayed. Microbiology Recent Results (from the past 240 hour(s))  Culture, sputum-assessment     Status: None (Preliminary result)   Collection Time: 09/04/16  7:55 PM  Result Value Ref Range Status   Specimen Description SPUTUM  Final   Special Requests NONE  Final   Sputum evaluation THIS SPECIMEN IS ACCEPTABLE FOR SPUTUM CULTURE  Final   Report Status PENDING  Incomplete     Medications:   . amLODipine  10 mg Oral Daily  . apixaban  5 mg Oral BID  . ezetimibe  10 mg Oral Daily  . furosemide  40 mg Oral BID  . iron polysaccharides  150 mg Oral Daily  . mouth rinse  15 mL Mouth Rinse BID  . pantoprazole  40 mg Oral Q0600  . potassium chloride SA  20 mEq Oral Daily  . spironolactone  25 mg Oral Daily  . tamsulosin  0.4 mg Oral QPM   Continuous Infusions: . sodium chloride 100 mL/hr at 09/05/16 0720  . azithromycin    . cefTRIAXone (ROCEPHIN)  IV        LOS: 0 days   Charlynne Cousins  [facility] Pager [phone]  *Please refer to [email or web address], password [email or web address] to get updated schedule on who will round on this patient, as hospitalists switch teams weekly. If 7PM-7AM, please contact night-coverage at [email or web address], password [email or web address] for any overnight needs.  09/05/2016, 10:48 AM

## 2016-09-05 NOTE — Progress Notes (Signed)
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 More recently the patient started to develop pulmonary symptoms and stopped smoking January 2018. He had an abnormal CXR 05/04/2016 as part of his evaluation and CT angio chest 05/04/2016 showed a 2.3cm RUL mass with other nonspecific changes. The patient was referred to [facility] for further evaluation but was unable to find transportation.  He was admitted 09/04/2016 with possible CAP. Repeat CXR show RUL consolidation.We were contacted with a concern re posible post obstructive pneumonia  Subjective:  Still has a cough productiveof white to brown phlegm. No hemoptysis, no purulent sputum.No pleurisy. Still has "chills," denies fever. Otherwise he feels "as usual." No family in room  Social History: Only relative is his sister in [place]. He is not married, has no children, lives at [facility].  Advanced Directives: Tells me he intends to name his friend Hillary Bow as [place], though she lives in [place].   Objective: older White male examined sitting in a chair  Vitals:   09/05/16 1227 09/05/16 1916  BP: 124/67 (!) 148/81  Pulse: 87 96  Resp: 20 18  Temp: 97.4 F (36.3 C) 97.5 F (36.4 C)    Body mass index is 30.04 kg/m.  Intake/Output Summary (Last 24 hours) at 09/05/16 2113 Last data filed at 09/05/16 1852  Gross per 24 hour  Intake             2610 ml  Output             1300 ml  Net             1310 ml     Lungs no rales or wheezes--auscultated anterolaterally  Heart regular rate and rhythm  Abdomen soft, +BS  Neuro nonfocal   CBG (last 3)  No results for input(s): GLUCAP in the last 72 hours.   Labs:  Lab Results  Component Value Date   WBC 7.9 09/05/2016   HGB 10.4 (L) 09/05/2016   HCT 34.6 (L) 09/05/2016   MCV 81.8 09/05/2016   PLT 259 09/05/2016   NEUTROABS 6.7 09/04/2016    @LASTCHEMISTRY @  Urine Studies No results for input(s): UHGB, CRYS in the last 72 hours.  Invalid input(s): UACOL, UAPR, USPG, UPH, UTP, UGL, UKET, UBIL, UNIT, UROB, ULEU, UEPI, UWBC, URBC, UBAC, CAST, UCOM, BILUA  Basic Metabolic Panel:  Recent Labs Lab 09/04/16 1059 09/05/16 0354  NA 133* 134*  K 3.3* 3.2*  CL 96* 98*  CO2 26 30  GLUCOSE 117* 139*  BUN 13 12  CREATININE 0.96 0.92  CALCIUM 8.9 8.1*   GFR Estimated Creatinine Clearance: 77.8 mL/min (by C-G formula based on SCr of 0.92 mg/dL). Liver Function Tests:  Recent Labs Lab 09/05/16 0354  AST 134*  ALT 41  ALKPHOS 228*  BILITOT 0.7  PROT 6.8  ALBUMIN 2.7*   No results for input(s): LIPASE, AMYLASE in the last 168 hours. No results for input(s): AMMONIA in the last 168 hours. Coagulation profile No results for input(s): INR, PROTIME in the last 168 hours.  CBC:  Recent Labs Lab 09/04/16 1059 09/05/16 0354  WBC 7.9 7.9  NEUTROABS 6.7  --   HGB 11.5* 10.4*  HCT 37.5* 34.6*  MCV 81.0 81.8  PLT 263 259   Cardiac Enzymes: No results for input(s): CKTOTAL, CKMB, CKMBINDEX,  TROPONINI in the last 168 hours. BNP: Invalid input(s): POCBNP CBG: No results for input(s): GLUCAP in the last 168 hours. D-Dimer No results for input(s): DDIMER in the last 72 hours. Hgb A1c No results for input(s): HGBA1C in the last 72 hours. Lipid Profile No results for input(s): CHOL, HDL, LDLCALC, TRIG, CHOLHDL, LDLDIRECT in the last 72 hours. Thyroid function studies No results for input(s): TSH, T4TOTAL, T3FREE, THYROIDAB in the last 72 hours.  Invalid input(s): FREET3 Anemia work up No results for input(s): VITAMINB12, FOLATE, FERRITIN, TIBC, IRON, RETICCTPCT in the last 72 hours. Microbiology Recent Results (from the past 240 hour(s))  Blood Culture (routine x 2)     Status: None (Preliminary result)   Collection Time: 09/04/16 11:33 AM  Result Value Ref Range  Status   Specimen Description BLOOD LEFT FOREARM  Final   Special Requests   Final    BOTTLES DRAWN AEROBIC AND ANAEROBIC Blood Culture adequate volume   Culture NO GROWTH 1 DAY  Final   Report Status PENDING  Incomplete  Blood Culture (routine x 2)     Status: None (Preliminary result)   Collection Time: 09/04/16 11:35 AM  Result Value Ref Range Status   Specimen Description BLOOD LEFT ANTECUBITAL  Final   Special Requests   Final    BOTTLES DRAWN AEROBIC AND ANAEROBIC Blood Culture adequate volume   Culture NO GROWTH 1 DAY  Final   Report Status PENDING  Incomplete  Urine culture     Status: Abnormal   Collection Time: 09/04/16 12:40 PM  Result Value Ref Range Status   Specimen Description URINE, RANDOM  Final   Special Requests NONE  Final   Culture <10,000 COLONIES/mL INSIGNIFICANT GROWTH (A)  Final   Report Status 09/05/2016 FINAL  Final  Culture, sputum-assessment     Status: None   Collection Time: 09/04/16  7:55 PM  Result Value Ref Range Status   Specimen Description SPUTUM  Final   Special Requests NONE  Final   Sputum evaluation THIS SPECIMEN IS ACCEPTABLE FOR SPUTUM CULTURE  Final   Report Status 09/05/2016 FINAL  Final  Culture, respiratory (NON-Expectorated)     Status: None (Preliminary result)   Collection Time: 09/04/16  7:55 PM  Result Value Ref Range Status   Specimen Description SPUTUM  Final   Special Requests NONE Reflexed from [id number]  Final   Gram Stain   Final    RARE WBC PRESENT, PREDOMINANTLY PMN FEW GRAM POSITIVE COCCI IN PAIRS FEW GRAM VARIABLE ROD FEW GRAM NEGATIVE RODS    Culture PENDING  Incomplete   Report Status PENDING  Incomplete      Studies:  Dg Chest 2 View  Result Date: 09/05/2016 CLINICAL DATA:  Pneumonia with shortness of breath EXAM: CHEST  2 VIEW COMPARISON:  September 04, 2016 chest radiograph and chest CT May 04, 2016 FINDINGS: There is persistent airspace consolidation in the anterior segment right upper lobe with without  appreciable change from 1 day prior. There is new focal airspace opacity in the lateral right base with minimal right pleural effusion. Left lung is clear. Heart is upper normal in size with mild pulmonary venous hypertension. No adenopathy evident. No bone lesions. IMPRESSION: Extensive airspace consolidation anterior segment right upper lobe, stable from 1 day prior and consistent with pneumonia. New focal presumed pneumonia lateral right base. New small right pleural effusion. Left lung unchanged. Stable cardiac silhouette with a degree of pulmonary vascular congestion. Followup PA and lateral chest radiographs recommended in  3-4 weeks following trial of antibiotic therapy to ensure resolution and exclude underlying malignancy. Electronically Signed   By: Lowella Grip III M.D.   On: 09/05/2016 07:49   Dg Chest 2 View  Result Date: 09/04/2016 CLINICAL DATA:  Productive cough and fever. EXAM: CHEST  2 VIEW COMPARISON:  05/04/2016 FINDINGS: Heart size is normal. Parenchymal consolidation is seen in the anterior right upper lobe which is new since prior study. This is suspicious for pneumonia. No evidence of pleural effusion. Mild scarring in right lung base is stable since previous exam. IMPRESSION: New parenchymal consolidation in the anterior right upper lobe, suspicious for pneumonia. Followup PA and lateral chest X-ray is recommended in 3-4 weeks following trial of antibiotic therapy to ensure resolution and exclude underlying malignancy. Electronically Signed   By: Earle Gell M.D.   On: 09/04/2016 10:47    Assessment: 76 y.o. [employer] resident with a history of erythrocytosis relatedtolongstanding tobacco abuse, now with progressive RUL changes.  Plan:  Possibly the simplest way to obtain a diagnosis will be to send sputum for cytology. This is not sensitive so sending 3 samples is recommended. A CEA can also be helpful. Those orders have been entered.  If these tests are non diagnostic  bronchoscopy would be the next step.   The patient would benefit from clarification of advanced directives. I have requested SW help with this.  Dr Heath Lark has followed the patient for erythrocytosis. However Dr Earlie Server is our lung cancer specialist. If lung cancer is confirmed, please call our intake nurse Isaiah Blakes at [phone] to request an outpatient consult with Dr Earlie Server.     Chauncey Cruel, MD 09/05/2016  9:13 PM Medical Oncology and Hematology [facility] [address] [place], [place] [address] Tel. [phone]    Fax. [phone]

## 2016-09-06 ENCOUNTER — Inpatient Hospital Stay (HOSPITAL_COMMUNITY): Payer: Medicare Other

## 2016-09-06 ENCOUNTER — Encounter (HOSPITAL_COMMUNITY): Payer: Self-pay | Admitting: Radiology

## 2016-09-06 DIAGNOSIS — J9601 Acute respiratory failure with hypoxia: Secondary | ICD-10-CM

## 2016-09-06 DIAGNOSIS — J181 Lobar pneumonia, unspecified organism: Secondary | ICD-10-CM

## 2016-09-06 DIAGNOSIS — R590 Localized enlarged lymph nodes: Secondary | ICD-10-CM

## 2016-09-06 DIAGNOSIS — R918 Other nonspecific abnormal finding of lung field: Secondary | ICD-10-CM

## 2016-09-06 LAB — MAGNESIUM: Magnesium: 1.8 mg/dL (ref 1.7–2.4)

## 2016-09-06 MED ORDER — IOPAMIDOL (ISOVUE-300) INJECTION 61%
INTRAVENOUS | Status: AC
  Administered 2016-09-06: 75 mL
  Filled 2016-09-06: qty 75

## 2016-09-06 MED ORDER — SODIUM CHLORIDE 0.9 % IV SOLN
INTRAVENOUS | Status: DC
  Administered 2016-09-06 (×2): via INTRAVENOUS

## 2016-09-06 MED ORDER — APIXABAN 5 MG PO TABS
5.0000 mg | ORAL_TABLET | Freq: Two times a day (BID) | ORAL | Status: DC
  Administered 2016-09-06 – 2016-09-12 (×13): 5 mg via ORAL
  Filled 2016-09-06 (×14): qty 1

## 2016-09-06 MED ORDER — PIPERACILLIN-TAZOBACTAM 3.375 G IVPB 30 MIN
3.3750 g | Freq: Four times a day (QID) | INTRAVENOUS | Status: DC
  Filled 2016-09-06 (×2): qty 50

## 2016-09-06 MED ORDER — PIPERACILLIN-TAZOBACTAM 3.375 G IVPB
3.3750 g | Freq: Three times a day (TID) | INTRAVENOUS | Status: DC
  Administered 2016-09-06 – 2016-09-12 (×18): 3.375 g via INTRAVENOUS
  Filled 2016-09-06 (×19): qty 50

## 2016-09-06 NOTE — Consult Note (Signed)
Name: Steven Krause MRN: [id number] DOB: Nov 10, 1940    ADMISSION DATE:  09/04/2016 CONSULTATION DATE:  09/06/16  REFERRING MD :  Burnis Medin  CHIEF COMPLAINT:  Cough, lung mass   HISTORY OF PRESENT ILLNESS:  Steven Krause is a 76 y.o. male with a PMH as outlined below including tobacco use with recent cessation 4 months ago (roughly 15 pack year history prior).  He was admitted 09/04/16 with cough and was found to have CAP.  He previously had CTA chest 05/04/16 which showed a 2.3cm RUL mass.  He was referred to pulmonology as outpatient but was unable to find transport.  He has had productive cough of dark sputum ever since last admission in February, subjective fevers and chills.  Denies night sweats or weight loss.  PAST MEDICAL HISTORY :   has a past medical history of Abdominal pain, generalized; Acquired polycythemia (01/05/2011); Allergy; Diarrhea; Dizziness and giddiness; Hernia of unspecified site of abdominal cavity without mention of obstruction or gangrene; Hypertension; Hypopotassemia; Iron excess (01/05/2011); Memory loss; Other specified cardiac dysrhythmias(427.89); Polycythemia vera(238.4); Polycythemia, secondary; Systemic hypertension; Tobacco use disorder; Unspecified disorder of kidney and ureter; Unspecified disorder of skin and subcutaneous tissue; and Unspecified hearing loss.  has a past surgical history that includes Cholecystectomy; Ventral hernia repair; INFLAMED COLON; US ECHOCARDIOGRAPHY (11/03/09); NM MYOCAR PERF WALL MOTION (8//30/11); Left Heart Cath and Coronary Angiography (N/A, 05/06/2016); Esophagogastroduodenoscopy (N/A, 05/10/2016); Colonoscopy (N/A, 05/10/2016); and Givens capsule study (N/A, 05/10/2016). Prior to Admission medications   Medication Sig Start Date End Date Taking? Authorizing Provider  amLODipine (NORVASC) 10 MG tablet Take 10 mg by mouth daily. 07/31/16  Yes [provider]  apixaban (ELIQUIS) 5 MG TABS tablet Take 1 tablet (5 mg total) by mouth 2  (two) times daily. 08/19/16  Yes Reed, [name] L, DO  ezetimibe (ZETIA) 10 MG tablet Take 1 tablet (10 mg total) by mouth daily. 08/19/16  Yes Reed, [name] L, DO  furosemide (LASIX) 40 MG tablet TAKE 1 TABLET IN THE MORNING AND TAKE 1 TABLET IN THE EVENING 08/19/16  Yes Reed, [name] L, DO  HYDROcodone-acetaminophen (NORCO) 5-325 MG tablet Take 1 tablet by mouth every 6 (six) hours as needed for moderate pain. 04/18/16  Yes Reed, [name] L, DO  iron polysaccharides (NIFEREX) 150 MG capsule Take 1 capsule (150 mg total) by mouth daily. 08/19/16  Yes Reed, [name] L, DO  pantoprazole (PROTONIX) 40 MG tablet Take 1 tablet (40 mg total) by mouth daily at 6 (six) AM. 08/19/16  Yes Reed, [name] L, DO  potassium chloride SA (K-DUR,KLOR-CON) 20 MEQ tablet Take 1 tablet (20 mEq total) by mouth daily. 08/19/16  Yes Reed, [name] L, DO  spironolactone (ALDACTONE) 25 MG tablet Take 1 tablet (25 mg total) by mouth daily. 08/19/16  Yes Reed, [name] L, DO  tamsulosin (FLOMAX) 0.4 MG CAPS capsule Take 1 capsule (0.4 mg total) by mouth daily. 08/19/16  Yes Reed, [name] L, DO   Allergies  Allergen Reactions  . Statins Nausea And Vomiting    FAMILY HISTORY:  family history includes Cancer in his brother; Colon cancer in his mother; Colon polyps (age of onset: 100) in his mother; Diabetes in his sister, sister, and sister; Heart disease in his father and sister. SOCIAL HISTORY:  reports that he quit smoking about 4 months ago. His smoking use included Pipe. He has a 13.75 pack-year smoking history. He has never used smokeless tobacco. He reports that he does not drink alcohol or use drugs.  REVIEW OF SYSTEMS:  All negative; except for those that are bolded, which indicate positives.  Constitutional: weight loss, weight gain, night sweats, fevers, chills, fatigue, weakness.  HEENT: headaches, sore throat, sneezing, nasal congestion, post nasal drip, difficulty swallowing, tooth/dental problems, visual complaints,  visual changes, ear aches. Neuro: difficulty with speech, weakness, numbness, ataxia. CV:  chest pain, orthopnea, PND, swelling in lower extremities, dizziness, palpitations, syncope.  Resp: cough, hemoptysis, dyspnea, wheezing. GI: heartburn, indigestion, abdominal pain, nausea, vomiting, diarrhea, constipation, change in bowel habits, loss of appetite, hematemesis, melena, hematochezia.  GU: dysuria, change in color of urine, urgency or frequency, flank pain, hematuria. MSK: joint pain or swelling, decreased range of motion. Psych: change in mood or affect, depression, anxiety, suicidal ideations, homicidal ideations. Skin: rash, itching, bruising.   SUBJECTIVE:  Currently no SOB.  Sitting on edge of bed, no complaints.  VITAL SIGNS: Temp:  [97.4 F (36.3 C)-97.9 F (36.6 C)] 97.9 F (36.6 C) (07/03 0425) Pulse Rate:  [67-96] 67 (07/03 0425) Resp:  [18-20] 20 (07/03 0425) BP: (124-148)/(67-81) 138/75 (07/03 0425) SpO2:  [92 %-96 %] 92 % (07/03 0425) Weight:  [93.3 kg (205 lb 9.6 oz)] 93.3 kg (205 lb 9.6 oz) (07/03 0425)  PHYSICAL EXAMINATION: General: Elderly appearing, in NAD. Neuro: A&O x 3, no deficits. HEENT: [place]/AT. Poor dentition. Cardiovascular: IRIR, no M/R/G. Lungs: Clear bilaterally. Abdomen: BS x 4, S/NT/ND. Musculoskeletal: No deformities, no edema. Skin: Warm, dry.    Recent Labs Lab 09/04/16 1059 09/05/16 0354  NA 133* 134*  K 3.3* 3.2*  CL 96* 98*  CO2 26 30  BUN 13 12  CREATININE 0.96 0.92  GLUCOSE 117* 139*    Recent Labs Lab 09/04/16 1059 09/05/16 0354  HGB 11.5* 10.4*  HCT 37.5* 34.6*  WBC 7.9 7.9  PLT 263 259   Dg Chest 2 View  Result Date: 09/05/2016 CLINICAL DATA:  Pneumonia with shortness of breath EXAM: CHEST  2 VIEW COMPARISON:  September 04, 2016 chest radiograph and chest CT May 04, 2016 FINDINGS: There is persistent airspace consolidation in the anterior segment right upper lobe with without appreciable change from 1 day prior.  There is new focal airspace opacity in the lateral right base with minimal right pleural effusion. Left lung is clear. Heart is upper normal in size with mild pulmonary venous hypertension. No adenopathy evident. No bone lesions. IMPRESSION: Extensive airspace consolidation anterior segment right upper lobe, stable from 1 day prior and consistent with pneumonia. New focal presumed pneumonia lateral right base. New small right pleural effusion. Left lung unchanged. Stable cardiac silhouette with a degree of pulmonary vascular congestion. Followup PA and lateral chest radiographs recommended in 3-4 weeks following trial of antibiotic therapy to ensure resolution and exclude underlying malignancy. Electronically Signed   By: Lowella Grip III M.D.   On: 09/05/2016 07:49   Dg Chest 2 View  Result Date: 09/04/2016 CLINICAL DATA:  Productive cough and fever. EXAM: CHEST  2 VIEW COMPARISON:  05/04/2016 FINDINGS: Heart size is normal. Parenchymal consolidation is seen in the anterior right upper lobe which is new since prior study. This is suspicious for pneumonia. No evidence of pleural effusion. Mild scarring in right lung base is stable since previous exam. IMPRESSION: New parenchymal consolidation in the anterior right upper lobe, suspicious for pneumonia. Followup PA and lateral chest X-ray is recommended in 3-4 weeks following trial of antibiotic therapy to ensure resolution and exclude underlying malignancy. Electronically Signed   By: Earle Gell M.D.   On: 09/04/2016 10:47  STUDIES:  CTA chest 2/28 > irregular 2.3cm RUL nodule suspicious for primary bronchogenic carcinoma.  Bulky right hilar and mild mediastinal adenopathy.  SIGNIFICANT EVENTS  7/1 > admit. 7/3 > PCCM consult.  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 Plan: Would not recommend bronch as would be low yield.  Would rather aim for EBUS which can be done as outpatient - appointment made with Dr. Ashok Cordia for next week, 09/15/16 at 2:30pm. Can continue eliquis, have re-ordered (will be stopped by Dr. Ashok Cordia depending on when EBUS can be performed). Continue tobacco cessation.  CAP. Plan: Continue empiric abx and follow cultures.  Rest per primary team.  Pt instructed to follow up with Dr. Ashok Cordia in the office next week. Nothing further to add.  PCCM will sign off.  Please do not hesitate to call us back if we can be of any further assistance.   Montey Hora, [facility] Pager: [phone]  or [phone] 09/06/2016, 10:35 AM  Attending Note:  76 year old male with PMH above presenting to PCCM with mediastinal LAN and lung masses as well as an obstructive PNA.  On exam, bibasilar crackles noted.  I reviewed CT myself, mediastinal LAN noted and diffuse pulmonary nodules that are not approachable bronchoscopically.  Discussed with PCCM-NP.    PNA:             - Continue zosyn             - Hold off vanc for now             - F/U on cultures  Lungs mass: bronch with a wash while an active infection is present would be less than helpful as it will likely produce atypical cells, if a wash from those areas are needed will need to be in 6 wks (after PNA is treated)             - Not approachable bronchoscopically  Mediastinal LAN:             - May be amenable to EBUS.             - F/U with Dr Ashok Cordia on 7/12.  Anti-coagulation:             - Restart eliquis.  PCCM will sign off, please call back if needed.  Patient seen and examined, agree with above note.  I dictated the care and orders written for this patient under my direction.  Rush Farmer, [place]

## 2016-09-06 NOTE — Clinical Social Work Note (Signed)
CSW acknowledges consult for advanced directives. Will ask RN to put in order for chaplain.  CSW signing off. Consult again if any social work needs arise.  Steven Krause, [place]

## 2016-09-06 NOTE — Progress Notes (Signed)
Pt stated that he is not a bird and he wasn't taking a bath at the sink.

## 2016-09-06 NOTE — Progress Notes (Signed)
Pharmacy Antibiotic Note  Steven Krause is a 76 y.o. male admitted on 09/04/2016 with aspiration pneumonia. Patient initially treated with ceftriaxone and azithromycin; however, MD wants broader anaerobe coverage, so pharmacy has been consulted for zosyn dosing. Renal function is good. Patient is also being seen by oncology for possible pulmonary malignancy. Clinically stable with normal white count. All cultures are negative so far.   Plan: Zosyn 3.375g IV q8h Monitor renal function, clinical status, ability to de-escalate  Height: 5\' 9"  (175.3 cm) Weight: 205 lb 9.6 oz (93.3 kg) IBW/kg (Calculated) : 70.7  Temp (24hrs), Avg:97.6 F (36.4 C), Min:97.4 F (36.3 C), Max:97.9 F (36.6 C)   Recent Labs Lab 09/04/16 1059  09/04/16 1819 09/04/16 2158 09/05/16 0055 09/05/16 0354 09/05/16 0652  WBC 7.9  --   --   --   --  7.9  --   CREATININE 0.96  --   --   --   --  0.92  --   LATICACIDVEN  --   < > 3.8* 2.5* 1.9 2.5* 1.7  < > = values in this interval not displayed.  Estimated Creatinine Clearance: 78.2 mL/min (by C-G formula based on SCr of 0.92 mg/dL).    Allergies  Allergen Reactions  . Statins Nausea And Vomiting   Antimicrobials this admission:  7/1 CTX >> 7/3 7/1 Azithro >> 7/3  Dose adjustments this admission:  Zosyn q6h > q8h EI Microbiology results:  7/1 BCx: NGTD 7/2 UCx:  Insig growth 7/2 Sputum:   Legionella UAg: Neg S. Pneumo UAg: Neg  Thank you for allowing pharmacy to be a part of this patient's care.  Dierdre Harness, [place], PharmD Clinical Pharmacy Resident [phone] (Pager) 09/06/2016 11:25 AM

## 2016-09-06 NOTE — Progress Notes (Signed)
[facility] PROGRESS NOTE    Progress Note  Steven Krause  CWC:[id number] DOB: May 12, 1940 DOA: 09/04/2016 PCP: Gayland Curry, DO     Brief Narrative:   Steven Krause is an 76 y.o. male past medical history of chronic diastolic heart failure, atrial fibrillation on Eluquis iron deficiency anemia recently hospitalized on February for pneumonia and a known middle lung nodule comes in for progressive cough that started 3 weeks prior to admission  Assessment/Plan:   Acute respiratory failure with hypoxia Mostly related to Post-obstructive PNA :  Improving partially , will switch the Abx to Zosyn and ask our pulmonary input for possible scope with Bx , hold the [facility] with eliquis. I will repeat the CT scan of the chest to evaluate for any lung collapse .  Right upper lobe mass: Oncology sent for cytology with the sputum and recommended pulmonary input , WILL CONSULT .  Dehydration with hyponatremia and Hypokalemia :   Hold the diuretics and hydrate gently .  Chronic diastolic heart failure: On the dry side , will avoid diuretics at this time .  Hyperlipidemia: Continue current regimen.  Chronic atrial fibrillation: Rate control , hold the eliquis for possible biopsy soon.  Iron deficiency anemia: Hemoglobin stable.   Tobacco use disorder  Obesity, unspecified  DVT prophylaxis: SCD . Family Communication:none Disposition Plan/Barrier to D/C: PENDING FURTHER WORK UP . Code Status:     Code Status Orders        Start     Ordered   09/04/16 1205  Full code  Continuous     09/04/16 1210    Code Status History    Date Active Date Inactive Code Status Order ID Comments User Context   05/04/2016  6:54 PM 05/12/2016  2:08 PM Full Code [id number]  Mendel Corning, MD Inpatient        IV Access:    Peripheral IV   Procedures and diagnostic studies:   Dg Chest 2 View  Result Date: 09/05/2016 CLINICAL DATA:  Pneumonia with shortness of breath EXAM: CHEST   2 VIEW COMPARISON:  September 04, 2016 chest radiograph and chest CT May 04, 2016 FINDINGS: There is persistent airspace consolidation in the anterior segment right upper lobe with without appreciable change from 1 day prior. There is new focal airspace opacity in the lateral right base with minimal right pleural effusion. Left lung is clear. Heart is upper normal in size with mild pulmonary venous hypertension. No adenopathy evident. No bone lesions. IMPRESSION: Extensive airspace consolidation anterior segment right upper lobe, stable from 1 day prior and consistent with pneumonia. New focal presumed pneumonia lateral right base. New small right pleural effusion. Left lung unchanged. Stable cardiac silhouette with a degree of pulmonary vascular congestion. Followup PA and lateral chest radiographs recommended in 3-4 weeks following trial of antibiotic therapy to ensure resolution and exclude underlying malignancy. Electronically Signed   By: Lowella Grip III M.D.   On: 09/05/2016 07:49   Dg Chest 2 View  Result Date: 09/04/2016 CLINICAL DATA:  Productive cough and fever. EXAM: CHEST  2 VIEW COMPARISON:  05/04/2016 FINDINGS: Heart size is normal. Parenchymal consolidation is seen in the anterior right upper lobe which is new since prior study. This is suspicious for pneumonia. No evidence of pleural effusion. Mild scarring in right lung base is stable since previous exam. IMPRESSION: New parenchymal consolidation in the anterior right upper lobe, suspicious for pneumonia. Followup PA and lateral chest X-ray is recommended in 3-4 weeks following trial  of antibiotic therapy to ensure resolution and exclude underlying malignancy. Electronically Signed   By: Earle Gell M.D.   On: 09/04/2016 10:47     Medical Consultants:    None.  Anti-Infectives:   Rocephin and azithro  Subjective:    Steven Krause he relates no shortness of breath is unchanged from prior.  Objective:    Vitals:    09/05/16 0858 09/05/16 1227 09/05/16 1916 09/06/16 0425  BP: (!) 141/81 124/67 (!) 148/81 138/75  Pulse: 63 87 96 67  Resp: 18 20 18 20   Temp: 97.5 F (36.4 C) 97.4 F (36.3 C) 97.5 F (36.4 C) 97.9 F (36.6 C)  TempSrc: Oral Oral Oral Oral  SpO2: 93% 94% 96% 92%  Weight:    93.3 kg (205 lb 9.6 oz)  Height:        Intake/Output Summary (Last 24 hours) at 09/06/16 0911 Last data filed at 09/06/16 0846  Gross per 24 hour  Intake             1360 ml  Output              700 ml  Net              660 ml   Filed Weights   09/04/16 1445 09/05/16 0109 09/06/16 0425  Weight: 91.5 kg (201 lb 11.2 oz) 92.3 kg (203 lb 6.4 oz) 93.3 kg (205 lb 9.6 oz)    Exam: General exam: In no acute distress. Respiratory system: Moderate air movement with crackles on the right upper lung Cardiovascular system: Regular rate and rhythm with positive S1 and S2 Gastrointestinal system: Positive bowel sounds soft nontender nondistended Central nervous system: Awake alert and oriented 3, no focal deficits. Extremities: No pedal edema. Skin: No rashes. Psychiatry: Mood and affect appear appropriate, judgment and insight appear normal.   Data Reviewed:    Labs: Basic Metabolic Panel:  Recent Labs Lab 09/04/16 1059 09/05/16 0354 09/06/16 0341  NA 133* 134*  --   K 3.3* 3.2*  --   CL 96* 98*  --   CO2 26 30  --   GLUCOSE 117* 139*  --   BUN 13 12  --   CREATININE 0.96 0.92  --   CALCIUM 8.9 8.1*  --   MG  --   --  1.8   GFR Estimated Creatinine Clearance: 78.2 mL/min (by C-G formula based on SCr of 0.92 mg/dL). Liver Function Tests:  Recent Labs Lab 09/05/16 0354  AST 134*  ALT 41  ALKPHOS 228*  BILITOT 0.7  PROT 6.8  ALBUMIN 2.7*   No results for input(s): LIPASE, AMYLASE in the last 168 hours. No results for input(s): AMMONIA in the last 168 hours. Coagulation profile No results for input(s): INR, PROTIME in the last 168 hours.  CBC:  Recent Labs Lab 09/04/16 1059  09/05/16 0354  WBC 7.9 7.9  NEUTROABS 6.7  --   HGB 11.5* 10.4*  HCT 37.5* 34.6*  MCV 81.0 81.8  PLT 263 259   Cardiac Enzymes: No results for input(s): CKTOTAL, CKMB, CKMBINDEX, TROPONINI in the last 168 hours. BNP (last 3 results) No results for input(s): PROBNP in the last 8760 hours. CBG: No results for input(s): GLUCAP in the last 168 hours. D-Dimer: No results for input(s): DDIMER in the last 72 hours. Hgb A1c: No results for input(s): HGBA1C in the last 72 hours. Lipid Profile: No results for input(s): CHOL, HDL, LDLCALC, TRIG, CHOLHDL, LDLDIRECT in the last 72  hours. Thyroid function studies: No results for input(s): TSH, T4TOTAL, T3FREE, THYROIDAB in the last 72 hours.  Invalid input(s): FREET3 Anemia work up: No results for input(s): VITAMINB12, FOLATE, FERRITIN, TIBC, IRON, RETICCTPCT in the last 72 hours. Sepsis Labs:  Recent Labs Lab 09/04/16 1059  09/04/16 2158 09/05/16 0055 09/05/16 0354 09/05/16 0652  WBC 7.9  --   --   --  7.9  --   LATICACIDVEN  --   < > 2.5* 1.9 2.5* 1.7  < > = values in this interval not displayed. Microbiology Recent Results (from the past 240 hour(s))  Blood Culture (routine x 2)     Status: None (Preliminary result)   Collection Time: 09/04/16 11:33 AM  Result Value Ref Range Status   Specimen Description BLOOD LEFT FOREARM  Final   Special Requests   Final    BOTTLES DRAWN AEROBIC AND ANAEROBIC Blood Culture adequate volume   Culture NO GROWTH 1 DAY  Final   Report Status PENDING  Incomplete  Blood Culture (routine x 2)     Status: None (Preliminary result)   Collection Time: 09/04/16 11:35 AM  Result Value Ref Range Status   Specimen Description BLOOD LEFT ANTECUBITAL  Final   Special Requests   Final    BOTTLES DRAWN AEROBIC AND ANAEROBIC Blood Culture adequate volume   Culture NO GROWTH 1 DAY  Final   Report Status PENDING  Incomplete  Urine culture     Status: Abnormal   Collection Time: 09/04/16 12:40 PM  Result  Value Ref Range Status   Specimen Description URINE, RANDOM  Final   Special Requests NONE  Final   Culture <10,000 COLONIES/mL INSIGNIFICANT GROWTH (A)  Final   Report Status 09/05/2016 FINAL  Final  Culture, sputum-assessment     Status: None   Collection Time: 09/04/16  7:55 PM  Result Value Ref Range Status   Specimen Description SPUTUM  Final   Special Requests NONE  Final   Sputum evaluation THIS SPECIMEN IS ACCEPTABLE FOR SPUTUM CULTURE  Final   Report Status 09/05/2016 FINAL  Final  Culture, respiratory (NON-Expectorated)     Status: None (Preliminary result)   Collection Time: 09/04/16  7:55 PM  Result Value Ref Range Status   Specimen Description SPUTUM  Final   Special Requests NONE Reflexed from [id number]  Final   Gram Stain   Final    RARE WBC PRESENT, PREDOMINANTLY PMN FEW GRAM POSITIVE COCCI IN PAIRS FEW GRAM VARIABLE ROD FEW GRAM NEGATIVE RODS    Culture PENDING  Incomplete   Report Status PENDING  Incomplete     Medications:   . amLODipine  10 mg Oral Daily  . apixaban  5 mg Oral BID  . ezetimibe  10 mg Oral Daily  . iron polysaccharides  150 mg Oral Daily  . mouth rinse  15 mL Mouth Rinse BID  . pantoprazole  40 mg Oral Q0600  . tamsulosin  0.4 mg Oral QPM   Continuous Infusions: . sodium chloride 75 mL/hr at 09/06/16 0816  . azithromycin Stopped (09/05/16 1257)  . cefTRIAXone (ROCEPHIN)  IV Stopped (09/05/16 1222)      LOS: 1 day   Waldron Session  [facility] Pager [phone]  *Please refer to [email or web address], password [email or web address] to get updated schedule on who will round on this patient, as hospitalists switch teams weekly. If 7PM-7AM, please contact night-coverage at [email or web address], password [email or web address] for any overnight needs.  09/06/2016, 9:11 AM

## 2016-09-07 ENCOUNTER — Inpatient Hospital Stay (HOSPITAL_COMMUNITY): Payer: Medicare Other

## 2016-09-07 DIAGNOSIS — Z515 Encounter for palliative care: Secondary | ICD-10-CM

## 2016-09-07 DIAGNOSIS — D509 Iron deficiency anemia, unspecified: Secondary | ICD-10-CM

## 2016-09-07 DIAGNOSIS — I5030 Unspecified diastolic (congestive) heart failure: Secondary | ICD-10-CM

## 2016-09-07 DIAGNOSIS — Z7189 Other specified counseling: Secondary | ICD-10-CM

## 2016-09-07 LAB — CEA: CEA: 91.8 ng/mL — ABNORMAL HIGH (ref 0.0–4.7)

## 2016-09-07 LAB — COMPREHENSIVE METABOLIC PANEL
ALT: 48 U/L (ref 17–63)
AST: 168 U/L — AB (ref 15–41)
Albumin: 2.7 g/dL — ABNORMAL LOW (ref 3.5–5.0)
Alkaline Phosphatase: 290 U/L — ABNORMAL HIGH (ref 38–126)
Anion gap: 6 (ref 5–15)
BUN: 12 mg/dL (ref 6–20)
CHLORIDE: 98 mmol/L — AB (ref 101–111)
CO2: 31 mmol/L (ref 22–32)
CREATININE: 0.93 mg/dL (ref 0.61–1.24)
Calcium: 8.5 mg/dL — ABNORMAL LOW (ref 8.9–10.3)
Glucose, Bld: 121 mg/dL — ABNORMAL HIGH (ref 65–99)
POTASSIUM: 3.7 mmol/L (ref 3.5–5.1)
SODIUM: 135 mmol/L (ref 135–145)
Total Bilirubin: 0.8 mg/dL (ref 0.3–1.2)
Total Protein: 7 g/dL (ref 6.5–8.1)

## 2016-09-07 LAB — CBC
HCT: 37.2 % — ABNORMAL LOW (ref 39.0–52.0)
Hemoglobin: 10.8 g/dL — ABNORMAL LOW (ref 13.0–17.0)
MCH: 24.3 pg — AB (ref 26.0–34.0)
MCHC: 29 g/dL — ABNORMAL LOW (ref 30.0–36.0)
MCV: 83.6 fL (ref 78.0–100.0)
PLATELETS: 269 10*3/uL (ref 150–400)
RBC: 4.45 MIL/uL (ref 4.22–5.81)
RDW: 19.8 % — AB (ref 11.5–15.5)
WBC: 8.6 10*3/uL (ref 4.0–10.5)

## 2016-09-07 LAB — CULTURE, RESPIRATORY

## 2016-09-07 LAB — CULTURE, RESPIRATORY W GRAM STAIN: Culture: NORMAL

## 2016-09-07 MED ORDER — FUROSEMIDE 10 MG/ML IJ SOLN
40.0000 mg | Freq: Once | INTRAMUSCULAR | Status: AC
  Administered 2016-09-07: 40 mg via INTRAVENOUS
  Filled 2016-09-07: qty 4

## 2016-09-07 NOTE — Evaluation (Signed)
Clinical/Bedside Swallow Evaluation Patient Details  Name: Neko Boyajian MRN: [id number] Date of Birth: 09-30-1940  Today's Date: 09/07/2016 Time: SLP Start Time (ACUTE ONLY): 1115 SLP Stop Time (ACUTE ONLY): 1130 SLP Time Calculation (min) (ACUTE ONLY): 15 min  Past Medical History:  Past Medical History:  Diagnosis Date  . Abdominal pain, generalized   . Acquired polycythemia 01/05/2011  . Allergy   . Diarrhea   . Dizziness and giddiness   . Hernia of unspecified site of abdominal cavity without mention of obstruction or gangrene   . Hypertension   . Hypopotassemia   . Iron excess 01/05/2011  . Memory loss   . Other specified cardiac dysrhythmias(427.89)   . 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 Polycythemia, secondary   . Systemic hypertension   . Tobacco use disorder   . Unspecified disorder of kidney and ureter   . Unspecified disorder of skin and subcutaneous tissue   . Unspecified hearing loss    Past Surgical History:  Past Surgical History:  Procedure Laterality Date  . CHOLECYSTECTOMY    . COLONOSCOPY N/A 05/10/2016   Procedure: COLONOSCOPY;  Surgeon: Manus Gunning, MD;  Location: [place];  Service: Gastroenterology;  Laterality: N/A;  . ESOPHAGOGASTRODUODENOSCOPY N/A 05/10/2016   Procedure: ESOPHAGOGASTRODUODENOSCOPY (EGD);  Surgeon: Manus Gunning, MD;  Location: [place];  Service: Gastroenterology;  Laterality: N/A;  . GIVENS CAPSULE STUDY N/A 05/10/2016   Procedure: GIVENS CAPSULE STUDY;  Surgeon: Manus Gunning, MD;  Location: [place];  Service: Gastroenterology;  Laterality: N/A;  . INFLAMED COLON    . LEFT HEART CATH AND CORONARY ANGIOGRAPHY N/A 05/06/2016   Procedure: Left Heart Cath and Coronary Angiography;  Surgeon: Peter M Martinique, MD;  Location: [place] [facility];  Service: Cardiovascular;  Laterality: N/A;  . NM MYOCAR PERF WALL MOTION  8//30/11   normal  . US ECHOCARDIOGRAPHY  11/03/09   EF 50-55%,  . VENTRAL HERNIA REPAIR      HPI:  76 y.o.malewith a PMH including tobacco use with recent cessation 4 months ago, HF, chronic LE edema, afib. He was admitted 09/04/16 with three-week hx cough and was found to have CAP. He previously had CTA chest 05/04/16 which showed a 2.3cm RUL mass.    Assessment / Plan / Recommendation Clinical Impression  Pt presents with a functional swallow marked by adequate mastication despite missing dentition; brisk swallow response; no overt s/s of aspiration.  Passed three oz water test without deficit.  Pt with consistent expiratory wheeze throughout assessment.  He denies swallowing difficulty.  Doubt dysphagia-related aspiration as etiology of pna.  Continue current diet.  Our services will s/o.  SLP Visit Diagnosis: Dysphagia, unspecified (R13.10)    Aspiration Risk  No limitations    Diet Recommendation   regular solids, thin liquids  Medication Administration: Whole meds with liquid    Other  Recommendations Oral Care Recommendations: Oral care BID   Follow up Recommendations None      Frequency and Duration            Prognosis        Swallow Study   General Date of Onset: 09/04/16 HPI: 76 y.o.malewith a PMH including tobacco use with recent cessation 4 months ago, HF, chronic LE edema, afib. He was admitted 09/04/16 with three-week hx cough and was found to have CAP. He previously had CTA chest 05/04/16 which showed a 2.3cm RUL mass.  Type of Study: Bedside Swallow Evaluation Previous Swallow Assessment: no Temperature Spikes Noted: No Respiratory  Status: Nasal cannula History of Recent Intubation: No Behavior/Cognition: Alert;Cooperative;Pleasant mood Oral Cavity Assessment: Within Functional Limits Oral Care Completed by SLP: No Oral Cavity - Dentition: Poor condition;Missing dentition Vision: Functional for self-feeding Self-Feeding Abilities: Able to feed self Patient Positioning: Upright in bed Baseline Vocal Quality: Normal Volitional Cough:  Strong Volitional Swallow: Able to elicit    Oral/Motor/Sensory Function Overall Oral Motor/Sensory Function: Within functional limits   Ice Chips Ice chips: Within functional limits   Thin Liquid Thin Liquid: Within functional limits    Nectar Thick Nectar Thick Liquid: Not tested   Honey Thick Honey Thick Liquid: Not tested   Puree Puree: Within functional limits   Solid   GO   Solid: Within functional limits        Juan Quam Laurice 09/07/2016,11:41 AM Estill Bamberg L. Tivis Ringer, [place] CCC/SLP Pager [phone]

## 2016-09-07 NOTE — Consult Note (Signed)
Consultation Note Date: 09/07/2016   Patient Name: Steven Krause  DOB: 09/04/1940  MRN: [id number]  Age / Sex: 76 y.o., male  PCP: Gayland Curry, DO Referring Physician: Hosie Poisson, MD  Reason for Consultation: Establishing goals of care and Psychosocial/spiritual support  HPI/Patient Profile:   76 y.o. male  admitted on 09/04/2016 with a past  medical history significant for diastolic heart failure, chronic lower extremity edema, chronic hypokalemia, chronic atrial fibrillation on Eliquis, chronic pipe use, history will hiatal hernia, iron deficiency anemia, prior hospitalization for pneumonia in February of this year at that time diagnosed with lung nodule, ( did not f/u with pulmonary 2/2 transportation issues) CT angio chest 05/04/2016 showed a 2.3cm RUL mass with other nonspecific changes.     Now presenting with the three-week through progressive history of productive cough, with dark sputum, accompanied by intermittent subjective fevers   Admitted for further evaluation and treatment   Patient faces treatment option decisions, advanced directive decisions and anticipatory care needs.   Clinical Assessment and Goals of Care:   This NP Wadie Lessen reviewed medical records, received report from team, assessed the patient and then meet at the patient's bedside to discuss diagnosis, prognosis, GOC,  and options.  A  discussion was had today regarding advanced directives.  Concepts specific to code status, artifical feeding and hydration,and rehospitalization was had.  The difference between a aggressive medical intervention path  and a palliative comfort care path for this patient at this time was had.  Values and goals of care important to patient and family were attempted to be elicited.    Questions and concerns addressed.   Family encouraged to call with questions or concerns.  PMT will continue  to support holistically.  PATIENT is his own decision maker at this time, he has very limited social support.  Only sister Zadie Rhine # [phone] has limited contact with the patient     SUMMARY OF RECOMMENDATIONS    Patient tells me he is open to all offered and available medical interventions to prolong life.  He would be open to SNF for rehab and wants to pursue treatment for possible cancer diagnosis.   Code Status/Advance Care Planning:  Full code   Palliative Prophylaxis:   Aspiration, Bowel Regimen, Delirium Protocol, Frequent Pain Assessment and Oral Care  Additional Recommendations (Limitations, Scope, Preferences):  Full Scope Treatment  Psycho-social/Spiritual:   Desire for further Chaplaincy support:yes  His sister tells me he has always been a loner and has little contact with family.  He was never married and has no children.  Reports he [facility] at [facility]  Prognosis:   Unable to determine  Discharge Planning: To Be Determined      Primary Diagnoses: Present on Admission: . Pneumonia . Tobacco use disorder . Obesity, unspecified . Ventricular bigeminy . Acquired polycythemia . Hypopotassemia . Chronic atrial fibrillation ([place]) . Hyperglycemia . Overweight (BMI 25.0-29.9) . Essential hypertension . Hyperlipidemia . Anemia . CAP (community acquired pneumonia)   I have reviewed  the medical record, interviewed the patient and family, and examined the patient. The following aspects are pertinent.  Past Medical History:  Diagnosis Date  . Abdominal pain, generalized   . Acquired polycythemia 01/05/2011  . Allergy   . Diarrhea   . Dizziness and giddiness   . Hernia of unspecified site of abdominal cavity without mention of obstruction or gangrene   . Hypertension   . Hypopotassemia   . Iron excess 01/05/2011  . Memory loss   . Other specified cardiac dysrhythmias(427.89)   . 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 Polycythemia, secondary    . Systemic hypertension   . Tobacco use disorder   . Unspecified disorder of kidney and ureter   . Unspecified disorder of skin and subcutaneous tissue   . Unspecified hearing loss    Social History   Social History  . Marital status: Single    Spouse name: N/A  . Number of children: N/A  . Years of education: N/A   Social History Main Topics  . Smoking status: Former Smoker    Packs/day: 0.25    Years: 55.00    Types: Pipe    Quit date: 05/01/2016  . Smokeless tobacco: Never Used  . Alcohol use No  . Drug use: No  . Sexual activity: Not Currently   Other Topics Concern  . None   Social History Narrative   Friend [name] is contact and helps with his finances, appts, etc.   Family History  Problem Relation Age of Onset  . Colon polyps Mother 41  . Colon cancer Mother   . Heart disease Father   . Diabetes Sister   . Cancer Brother   . Diabetes Sister   . Diabetes Sister   . Heart disease Sister   . Stomach cancer Neg Hx   . Rectal cancer Neg Hx    Scheduled Meds: . amLODipine  10 mg Oral Daily  . apixaban  5 mg Oral BID  . ezetimibe  10 mg Oral Daily  . iron polysaccharides  150 mg Oral Daily  . mouth rinse  15 mL Mouth Rinse BID  . pantoprazole  40 mg Oral Q0600  . tamsulosin  0.4 mg Oral QPM   Continuous Infusions: . piperacillin-tazobactam (ZOSYN)  IV Stopped (09/07/16 0901)   PRN Meds:.guaiFENesin, HYDROcodone-acetaminophen, ipratropium-albuterol Medications Prior to Admission:  Prior to Admission medications   Medication Sig Start Date End Date Taking? Authorizing Provider  amLODipine (NORVASC) 10 MG tablet Take 10 mg by mouth daily. 07/31/16  Yes [provider]  apixaban (ELIQUIS) 5 MG TABS tablet Take 1 tablet (5 mg total) by mouth 2 (two) times daily. 08/19/16  Yes Reed, [name] L, DO  ezetimibe (ZETIA) 10 MG tablet Take 1 tablet (10 mg total) by mouth daily. 08/19/16  Yes Reed, [name] L, DO  furosemide (LASIX) 40 MG tablet TAKE 1 TABLET  IN THE MORNING AND TAKE 1 TABLET IN THE EVENING 08/19/16  Yes Reed, [name] L, DO  HYDROcodone-acetaminophen (NORCO) 5-325 MG tablet Take 1 tablet by mouth every 6 (six) hours as needed for moderate pain. 04/18/16  Yes Reed, [name] L, DO  iron polysaccharides (NIFEREX) 150 MG capsule Take 1 capsule (150 mg total) by mouth daily. 08/19/16  Yes Reed, [name] L, DO  pantoprazole (PROTONIX) 40 MG tablet Take 1 tablet (40 mg total) by mouth daily at 6 (six) AM. 08/19/16  Yes Reed, [name] L, DO  potassium chloride SA (K-DUR,KLOR-CON) 20 MEQ tablet Take 1 tablet (20 mEq total)  by mouth daily. 08/19/16  Yes Reed, [name] L, DO  spironolactone (ALDACTONE) 25 MG tablet Take 1 tablet (25 mg total) by mouth daily. 08/19/16  Yes Reed, [name] L, DO  tamsulosin (FLOMAX) 0.4 MG CAPS capsule Take 1 capsule (0.4 mg total) by mouth daily. 08/19/16  Yes Reed, [name] L, DO   Allergies  Allergen Reactions  . Statins Nausea And Vomiting   Review of Systems  Constitutional: Positive for unexpected weight change.  Respiratory: Positive for shortness of breath and wheezing.   Neurological: Positive for weakness.    Physical Exam  Constitutional: He appears ill. Nasal cannula in place.  HENT:  Mouth/Throat: Oropharynx is clear and moist. Abnormal dentition. Dental caries present.  Cardiovascular: Normal rate, regular rhythm and normal heart sounds.   BLE  +2 edema  Pulmonary/Chest: He has decreased breath sounds in the right lower field and the left lower field.  Abdominal:  -noted hernia  Neurological: He is alert.  Skin: Skin is warm and dry.    Vital Signs: BP 133/68 (BP Location: Right Arm)   Pulse (!) 57   Temp 97.9 F (36.6 C) (Oral)   Resp 18   Ht 5\' 9"  (1.753 m)   Wt 95.4 kg (210 lb 4.8 oz)   SpO2 93%   BMI 31.06 kg/m  Pain Assessment: No/denies pain   Pain Score: Asleep   SpO2: SpO2: 93 % O2 Device:SpO2: 93 % O2 Flow Rate: .O2 Flow Rate (L/min): 4 L/min  IO: Intake/output summary:    Intake/Output Summary (Last 24 hours) at 09/07/16 1251 Last data filed at 09/07/16 0836  Gross per 24 hour  Intake             1740 ml  Output             1370 ml  Net              370 ml    LBM: Last BM Date: 09/05/16 Baseline Weight: Weight: 98.9 kg (218 lb) Most recent weight: Weight: 95.4 kg (210 lb 4.8 oz)     Palliative Assessment/Data: 50%    Discussed with Dr Karleen Hampshire  Time In: 1100 Time Out: 1215 Time Total: 75 min Greater than 50%  of this time was spent counseling and coordinating care related to the above assessment and plan.  Signed by: Wadie Lessen, NP   Please contact Palliative Medicine Team phone at [phone] for questions and concerns.  For individual provider: See Shea Evans

## 2016-09-07 NOTE — Progress Notes (Signed)
PROGRESS NOTE    Steven Krause  CHE:[id number] DOB: [birth year] DOA: 09/04/2016 PCP: Gayland Curry, DO    Brief Narrative: 76 year old gentleman with prior h/o hypertension, tobacco abuse, presents with worsening SOB and recurrent pneumonia and RUL mass.    Assessment & Plan:   Active Problems:   Acquired polycythemia   Tobacco use disorder   Hypopotassemia   Obesity, unspecified   Chronic atrial fibrillation (HCC)   Ventricular bigeminy   Hyperglycemia   Overweight (BMI 25.0-29.9)   Essential hypertension   Hyperlipidemia   Anemia   CAP (community acquired pneumonia)   Pneumonia   Acute respiratory failure with hypoxia (HCC)   Mass of right lung   Diastolic heart failure (HCC)   Hyponatremia   Acute respiratory failure with hypoxia:  Secondary to copd/ obstructive pneumonia/ RUL mass with hilar and mediastinal adenopathy concerning for bronchogenic ca.  On IV zosyn to complete the course.  Was scheduled to see PCCM as outpatient but he couldn't keep the appt.  PCCM consulted to see if he needs a bronch for evaluation of malignancy.  Unfortunately, the LN are not accessible by bronchoscopy.  Blood cultures are negative.  Sputum cultures show normal flora.  Cytology from sputum pending.  Recommend to see Dr Ashok Cordia on 7/12 .    Hypertension:  Controlled.    Mild normocytic anemia:  Hemoglobin stable around 10.8   Hypokalemia: repleted.     Chronic diastolic heart failure"  Compensated.    Hyponatremia: resolved.   Chronic atrial fibrillation;  Resume eliquis.    In view of his social condition and multiple co morbidities palliative care consulted and recommendations in chart.    DVT prophylaxis: eliquis.  Code Status: full code.  Family Communication: none at bedside. Discussed the plan of car with the patient.  Disposition Plan: to SNF in 1 to 2 days.    Consultants:  Oncology PCCM.    Procedures: none.    Antimicrobials: completed  8 days of ZOSYN.    Subjective: Reports some sob on exertion.  Is on 4lit of [place] oxygen.  Wants to go to rehab.   Objective: Vitals:   09/07/16 0450 09/07/16 0506 09/07/16 0928 09/07/16 1158  BP:    133/68  Pulse:    (!) 57  Resp:    18  Temp:    97.9 F (36.6 C)  TempSrc:    Oral  SpO2: 92% 91% 93% 93%  Weight:      Height:        Intake/Output Summary (Last 24 hours) at 09/07/16 1909 Last data filed at 09/07/16 1819  Gross per 24 hour  Intake             1115 ml  Output             1350 ml  Net             -235 ml   Filed Weights   09/05/16 0109 09/06/16 0425 09/07/16 0253  Weight: 92.3 kg (203 lb 6.4 oz) 93.3 kg (205 lb 9.6 oz) 95.4 kg (210 lb 4.8 oz)    Examination:  General exam: Appears in mild distress from sob. On 4 lit of [place] oxygen.  Respiratory system: bilateral rhonchi. No wheezing. . Cardiovascular system: S1 & S2 heard, RRR. No JVD, murmurs, rubs, gallops or clicks. No pedal edema. Gastrointestinal system: Abdomen is nondistended, soft and nontender. No organomegaly or masses felt. Normal bowel sounds heard. Central nervous system: Alert and  oriented. No focal neurological deficits. Extremities: Symmetric 5 x 5 power. Skin: No rashes, lesions or ulcers Psychiatry: Judgement and insight appear normal. Mood & affect appropriate.     Data Reviewed: I have personally reviewed following labs and imaging studies  CBC:  Recent Labs Lab 09/04/16 1059 09/05/16 0354 09/07/16 0305  WBC 7.9 7.9 8.6  NEUTROABS 6.7  --   --   HGB 11.5* 10.4* 10.8*  HCT 37.5* 34.6* 37.2*  MCV 81.0 81.8 83.6  PLT 263 259 323   Basic Metabolic Panel:  Recent Labs Lab 09/04/16 1059 09/05/16 0354 09/06/16 0341 09/07/16 0305  NA 133* 134*  --  135  K 3.3* 3.2*  --  3.7  CL 96* 98*  --  98*  CO2 26 30  --  31  GLUCOSE 117* 139*  --  121*  BUN 13 12  --  12  CREATININE 0.96 0.92  --  0.93  CALCIUM 8.9 8.1*  --  8.5*  MG  --   --  1.8  --    GFR: Estimated  Creatinine Clearance: 78.2 mL/min (by C-G formula based on SCr of 0.93 mg/dL). Liver Function Tests:  Recent Labs Lab 09/05/16 0354 09/07/16 0305  AST 134* 168*  ALT 41 48  ALKPHOS 228* 290*  BILITOT 0.7 0.8  PROT 6.8 7.0  ALBUMIN 2.7* 2.7*   No results for input(s): LIPASE, AMYLASE in the last 168 hours. No results for input(s): AMMONIA in the last 168 hours. Coagulation Profile: No results for input(s): INR, PROTIME in the last 168 hours. Cardiac Enzymes: No results for input(s): CKTOTAL, CKMB, CKMBINDEX, TROPONINI in the last 168 hours. BNP (last 3 results) No results for input(s): PROBNP in the last 8760 hours. HbA1C: No results for input(s): HGBA1C in the last 72 hours. CBG: No results for input(s): GLUCAP in the last 168 hours. Lipid Profile: No results for input(s): CHOL, HDL, LDLCALC, TRIG, CHOLHDL, LDLDIRECT in the last 72 hours. Thyroid Function Tests: No results for input(s): TSH, T4TOTAL, FREET4, T3FREE, THYROIDAB in the last 72 hours. Anemia Panel: No results for input(s): VITAMINB12, FOLATE, FERRITIN, TIBC, IRON, RETICCTPCT in the last 72 hours. Sepsis Labs:  Recent Labs Lab 09/04/16 2158 09/05/16 0055 09/05/16 0354 09/05/16 5573  LATICACIDVEN 2.5* 1.9 2.5* 1.7    Recent Results (from the past 240 hour(s))  Blood Culture (routine x 2)     Status: None (Preliminary result)   Collection Time: 09/04/16 11:33 AM  Result Value Ref Range Status   Specimen Description BLOOD LEFT FOREARM  Final   Special Requests   Final    BOTTLES DRAWN AEROBIC AND ANAEROBIC Blood Culture adequate volume   Culture NO GROWTH 3 DAYS  Final   Report Status PENDING  Incomplete  Blood Culture (routine x 2)     Status: None (Preliminary result)   Collection Time: 09/04/16 11:35 AM  Result Value Ref Range Status   Specimen Description BLOOD LEFT ANTECUBITAL  Final   Special Requests   Final    BOTTLES DRAWN AEROBIC AND ANAEROBIC Blood Culture adequate volume   Culture NO  GROWTH 3 DAYS  Final   Report Status PENDING  Incomplete  Urine culture     Status: Abnormal   Collection Time: 09/04/16 12:40 PM  Result Value Ref Range Status   Specimen Description URINE, RANDOM  Final   Special Requests NONE  Final   Culture <10,000 COLONIES/mL INSIGNIFICANT GROWTH (A)  Final   Report Status 09/05/2016 FINAL  Final  Culture, sputum-assessment     Status: None   Collection Time: 09/04/16  7:55 PM  Result Value Ref Range Status   Specimen Description SPUTUM  Final   Special Requests NONE  Final   Sputum evaluation THIS SPECIMEN IS ACCEPTABLE FOR SPUTUM CULTURE  Final   Report Status 09/05/2016 FINAL  Final  Culture, respiratory (NON-Expectorated)     Status: None   Collection Time: 09/04/16  7:55 PM  Result Value Ref Range Status   Specimen Description SPUTUM  Final   Special Requests NONE Reflexed from [id number]  Final   Gram Stain   Final    RARE WBC PRESENT, PREDOMINANTLY PMN FEW GRAM POSITIVE COCCI IN PAIRS FEW GRAM VARIABLE ROD FEW GRAM NEGATIVE RODS    Culture Consistent with normal respiratory flora.  Final   Report Status 09/07/2016 FINAL  Final         Radiology Studies: Ct Chest W Contrast  Result Date: 09/06/2016 CLINICAL DATA:  Short of breath on exertion for 6 months. Cough for 3 months. Ex-smoker, quitting in February. EXAM: CT CHEST WITH CONTRAST TECHNIQUE: Multidetector CT imaging of the chest was performed during intravenous contrast administration. CONTRAST:  23mL ISOVUE-300 IOPAMIDOL (ISOVUE-300) INJECTION 61% COMPARISON:  Plain films including 1 day prior.  CT of 05/04/2016. FINDINGS: Cardiovascular: Mild motion degradation throughout. Aortic and branch vessel atherosclerosis. Tortuous thoracic aorta. Moderate cardiomegaly, without pericardial effusion. Multivessel coronary artery atherosclerosis. Pulmonary artery enlargement, 3.3 cm outflow tract. No central pulmonary embolism, on this non-dedicated study. Mediastinum/Nodes: No supraclavicular  adenopathy. Nonspecific right-sided thyroid nodules. Progressive thoracic adenopathy. Example precarinal node at 2.7 cm on image 50/series 3. Direct tumor extension in the right-side of the mediastinum, including on image 57/series 3. Right hilar involvement by tumor and/or adenopathy, including on image 68/series 3. Developing adenopathy within the azygoesophageal recess. This measures 1.7 cm on image 78/series 3. Left infrahilar adenopathy at 1.5 cm on image 76/series 3. Lungs/Pleura: Small right-sided pleural effusion is slightly increased. Trace left pleural fluid is similar. Right upper lobe endobronchial compression or obstruction. Marked worsening of right upper lobe aeration with partial opacification by masslike soft tissue density without air bronchograms. Surrounding areas of nodular airspace opacity, septal thickening and ground-glass throughout the remainder of the right upper lobe. Dependent right lower lobe atelectasis. Similar right lower lobe nodularity including at 7 mm on image 87/series 7. A right apical 5 mm nodule is similar on image 21/series 7. Upper Abdomen: Development of heterogeneous density throughout the liver, most consistent with widespread metastatic disease. most apparent on series 8. Normal imaged portions of the spleen, stomach, pancreas, adrenal glands. Increased number and size of upper abdominal retroperitoneal nodes. Example retrocaval node of 1.3 cm on image 56/series 8 versus 1.0 cm on the prior. Apparent collateral vein entering the IVC in the right-sided abdomen on image 55/series 8, similar and of indeterminate etiology. Incompletely imaged abdominal wall laxity and hernia containing nonobstructive bowel. Musculoskeletal: No acute osseous abnormality. IMPRESSION: 1. 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 2. Progressive masslike opacity throughout the right upper lobe with direct extension to the right hilum on right side  of the mediastinum. 3. New or progressive thoracic nodal metastasis. 4. Development of extensive hepatic and upper abdominal nodal metastasis. 5. Mild motion degradation. 6. Other areas of right upper lobe ground-glass and pulmonary opacity could represent postobstructive pneumonitis and/or lymphangitic tumor. 7. Increase in small right and similar trace left pleural effusion. 8. Incompletely imaged collateral vein about the  right-side of the abdomen is nonspecific. This could be related to chronic IVC thrombus or even an incompletely imaged right renal mass. 9. Pulmonary artery enlargement suggests pulmonary arterial hypertension. 10. Coronary artery atherosclerosis. Aortic Atherosclerosis (ICD10-I70.0). These results will be called to the ordering clinician or representative by the Radiologist Assistant, and communication documented in the PACS or zVision Dashboard. Electronically Signed   By: Abigail Miyamoto M.D.   On: 09/06/2016 13:50   Dg Chest Port 1 View  Result Date: 09/07/2016 CLINICAL DATA:  Hypoxia EXAM: PORTABLE CHEST 1 VIEW COMPARISON:  Chest CT 09/06/2016 Chest radiograph 09/05/2016 FINDINGS: There is continued consolidation of the lower portion of the right upper lobe with prominence of the right hilum consistent with hilar mass. There is a small right pleural effusion. Mild airspace opacities in the right lower lobe. Left lung is clear. No pneumothorax. IMPRESSION: Partial right upper lobe consolidation, likely postobstructive in the setting of right hilar mass. The findings have slightly worsened from the prior chest radiograph of 09/05/2016. Small right pleural effusion. Electronically Signed   By: Ulyses Jarred M.D.   On: 09/07/2016 06:10        Scheduled Meds: . amLODipine  10 mg Oral Daily  . apixaban  5 mg Oral BID  . ezetimibe  10 mg Oral Daily  . iron polysaccharides  150 mg Oral Daily  . mouth rinse  15 mL Mouth Rinse BID  . pantoprazole  40 mg Oral Q0600  . tamsulosin  0.4 mg  Oral QPM   Continuous Infusions: . piperacillin-tazobactam (ZOSYN)  IV Stopped (09/07/16 1812)     LOS: 2 days    Time spent: 45 minutes.     Hosie Poisson, MD [facility] Pager [phone] If 7PM-7AM, please contact night-coverage [email or web address] Password [email or web address] 09/07/2016, 7:09 PM

## 2016-09-07 NOTE — Evaluation (Signed)
Physical Therapy Evaluation Patient Details Name: Nyko Gell MRN: [id number] DOB: 06-28-1940 Today's Date: 09/07/2016   History of Present Illness  Loman Logan is an 76 y.o. male past medical history significant for chronic diastolic heart failure, atrial fibrillation on Eluquis iron deficiency anemia recently hospitalized on February for pneumonia and a known middle lung nodule comes in for progressive cough that started 3 weeks prior to admission.  Imaging showed CAP; workup continues  Clinical Impression  Pt admitted with/for progressive cough due to CAP.  Pt at min guard to min assist level for mobility.  Oxygen will be required at d/c with sats on RA at 83%.  Pt currently limited functionally due to the problems listed. ( See problems list.)   Pt will benefit from PT to maximize function and safety in order to get ready for next venue listed below.     Follow Up Recommendations SNF    Equipment Recommendations       Recommendations for Other Services       Precautions / Restrictions Precautions Precautions: Fall      Mobility  Bed Mobility Overal bed mobility: Modified Independent             General bed mobility comments: sitting at EOB on arrival, returned to supine without assist or rails.  pt was too spent from standing at sink shaving to correct position in bed without some help.  Transfers Overall transfer level: Needs assistance   Transfers: Sit to/from Stand Sit to Stand: Supervision         General transfer comment: pt used stationary surfaces,  armrest or bed to stand  Ambulation/Gait Ambulation/Gait assistance: Min guard Ambulation Distance (Feet): 25 Feet (to 30 feet in the room gathering shaving supplies.) Assistive device: None Gait Pattern/deviations: Step-through pattern   Gait velocity interpretation: Below normal speed for age/gender General Gait Details: tentative, short steps, mild dyspnea.  Able to carry object to shave, but  overall guarded.  Stairs            Wheelchair Mobility    Modified Rankin (Stroke Patients Only)       Balance Overall balance assessment: Needs assistance Sitting-balance support: No upper extremity supported Sitting balance-Leahy Scale: Good     Standing balance support: No upper extremity supported Standing balance-Leahy Scale: Fair Standing balance comment: spent over 10 min prepping for and shaving at the sink with no UE assist                             Pertinent Vitals/Pain Pain Assessment: Faces Faces Pain Scale: No hurt    Home Living Family/patient expects to be discharged to:: Private residence Living Arrangements: Alone Available Help at Discharge: Neighbor;Available PRN/intermittently Type of Home: Apartment Home Access: Level entry     Home Layout: One level        Prior Function Level of Independence: Independent               Hand Dominance        Extremity/Trunk Assessment   Upper Extremity Assessment Upper Extremity Assessment: Generalized weakness    Lower Extremity Assessment Lower Extremity Assessment: Generalized weakness       Communication      Cognition Arousal/Alertness: Awake/alert Behavior During Therapy: WFL for tasks assessed/performed Overall Cognitive Status: Within Functional Limits for tasks assessed  General Comments: pt doe not seem to be fully aware of how important it is to wear the oxygen at this time.  He had the oxygen off  in anticipation of shaving, long before he actually shaved.      General Comments General comments (skin integrity, edema, etc.): On RA during shaving, pt's sats dropped to 83% and HR 75 bpm.  Pt was noticeably fatigued once oxygen reapplied.    Exercises     Assessment/Plan    PT Assessment Patient needs continued PT services  PT Problem List Decreased strength;Decreased activity tolerance;Decreased  balance;Decreased mobility;Decreased safety awareness;Cardiopulmonary status limiting activity       PT Treatment Interventions Gait training;Functional mobility training;Therapeutic activities;DME instruction;Therapeutic exercise;Balance training;Patient/family education    PT Goals (Current goals can be found in the Care Plan section)  Acute Rehab PT Goals Patient Stated Goal: back home at some point PT Goal Formulation: With patient Time For Goal Achievement: 09/21/16 Potential to Achieve Goals: Fair    Frequency Min 3X/week   Barriers to discharge Decreased caregiver support      Co-evaluation               AM-PAC PT "6 Clicks" Daily Activity  Outcome Measure Difficulty turning over in bed (including adjusting bedclothes, sheets and blankets)?: A Little Difficulty moving from lying on back to sitting on the side of the bed? : A Little Difficulty sitting down on and standing up from a chair with arms (e.g., wheelchair, bedside commode, etc,.)?: A Little Help needed moving to and from a bed to chair (including a wheelchair)?: A Little Help needed walking in hospital room?: A Little Help needed climbing 3-5 steps with a railing? : A Lot 6 Click Score: 17    End of Session Equipment Utilized During Treatment: Oxygen Activity Tolerance: Patient tolerated treatment well;Patient limited by fatigue Patient left: in bed;with call bell/phone within reach Nurse Communication: Mobility status PT Visit Diagnosis: Unsteadiness on feet (R26.81);Other abnormalities of gait and mobility (R26.89)    Time: 3845-3646 PT Time Calculation (min) (ACUTE ONLY): 39 min   Charges:   PT Evaluation $PT Eval Moderate Complexity: 1 Procedure PT Treatments $Gait Training: 8-22 mins $Therapeutic Activity: 8-22 mins   PT G Codes:        09-19-2016  Donnella Sham, PT [phone]  (pager)  Tessie Fass Colbe Viviano 09-19-2016, 12:19 PM

## 2016-09-07 NOTE — Progress Notes (Signed)
Responded to consult for palliative for pt who'd just learned he has lung cancer. Pt is pleasant. His speech is somewhat hard to understand. Provided emotional/spiritual support and prayer - which pt appreciated. He said  Come back tomorrow! when I had to leave. Chaplain available for f/u.   09/07/16 1500  Clinical Encounter Type  Visited With Patient;Health care provider  Visit Type Initial;Psychological support;Spiritual support;Social support  Referral From Nurse  Spiritual Encounters  Spiritual Needs Prayer;Emotional  Stress Factors  Patient Stress Factors Health changes;Loss of control   Gerrit Heck, Chaplain

## 2016-09-07 NOTE — Progress Notes (Signed)
Pt educated about safety and importance of bed alarm during the night however pt refuses to be on bed alarm. Will continue to round on patient.   Glorious Flicker, RN    

## 2016-09-07 NOTE — Discharge Instructions (Signed)

## 2016-09-08 DIAGNOSIS — R0600 Dyspnea, unspecified: Secondary | ICD-10-CM

## 2016-09-08 DIAGNOSIS — I5033 Acute on chronic diastolic (congestive) heart failure: Secondary | ICD-10-CM

## 2016-09-08 DIAGNOSIS — R0902 Hypoxemia: Secondary | ICD-10-CM

## 2016-09-08 DIAGNOSIS — Z515 Encounter for palliative care: Secondary | ICD-10-CM

## 2016-09-08 DIAGNOSIS — Z66 Do not resuscitate: Secondary | ICD-10-CM

## 2016-09-08 LAB — BRAIN NATRIURETIC PEPTIDE: B Natriuretic Peptide: 290.2 pg/mL — ABNORMAL HIGH (ref 0.0–100.0)

## 2016-09-08 NOTE — Clinical Social Work Note (Signed)
Patient on the phone for a while. CSW gave RN list of bed offers to give to patient once he is off the phone. CSW will follow up tomorrow for bed choice and to start insurance authorization for weekend discharge.  Dayton Scrape, [place]

## 2016-09-08 NOTE — Clinical Social Work Note (Signed)
Clinical Social Work Assessment  Patient Details  Name: Steven Krause MRN: [id number] Date of Birth: 09/23/1940  Date of referral:  09/08/16               Reason for consult:  Facility Placement, Discharge Planning                Permission sought to share information with:  Chartered certified accountant granted to share information::  Yes, Verbal Permission Granted  Name::        Agency::  SNF's  Relationship::     Contact Information:     Housing/Transportation Living arrangements for the past 2 months:  Apartment Source of Information:  Patient, Medical Team Patient Interpreter Needed:  None Criminal Activity/Legal Involvement Pertinent to Current Situation/Hospitalization:  No - Comment as needed Significant Relationships:  Friend, Siblings Lives with:  Self Do you feel safe going back to the place where you live?  Yes Need for family participation in patient care:  Yes (Comment)  Care giving concerns:  PT recommending SNF once medically stable for discharge.   Social Worker assessment / plan:  CSW met with patient. No supports at bedside. CSW introduced role and explained that PT recommendations would be discussed. Patient is agreeable to SNF placement. No preference for facility. No further concerns. CSW encouraged patient to contact CSW as needed. CSW will continue to follow patient for support and facilitate discharge to SNF once medically stable.  Employment status:  Retired Nurse, adult PT Recommendations:  [place] / Referral to community resources:  [place]  Patient/Family's Response to care:  Patient agreeable to SNF placement. Patient's sister and friend supportive and involved in patient's care. Patient appreciated social work intervention.  Patient/Family's Understanding of and Emotional Response to Diagnosis, Current Treatment, and Prognosis:  Patient has a good understanding  of the reason for admission and his need for rehab prior to returning home. Patient verbalized that he is only interested in temporary rehab. Patient appears happy with hospital care.  Emotional Assessment Appearance:  Appears stated age Attitude/Demeanor/Rapport:  Other (Pleasant) Affect (typically observed):  Accepting, Appropriate, Calm, Pleasant Orientation:  Oriented to Self, Oriented to Place, Oriented to  Time, Oriented to Situation Alcohol / Substance use:  Tobacco Use Psych involvement (Current and /or in the community):  No (Comment)  Discharge Needs  Concerns to be addressed:  Care Coordination Readmission within the last 30 days:  No Current discharge risk:  Dependent with Mobility, Lives alone Barriers to Discharge:  Continued Medical Work up, [place], LCSW 09/08/2016, 11:56 AM

## 2016-09-08 NOTE — Progress Notes (Signed)
Refused bed alarm.  

## 2016-09-08 NOTE — Progress Notes (Signed)
Pt is stable in AM shift 7am to 7pm, vitals stable, pain medicine provided twice for discomfort of back and abdomen, oxygen continue via [place] @4l /min, IVABX continue, Code status will continue to monitor the patient

## 2016-09-08 NOTE — NC FL2 (Signed)
[place] LEVEL OF CARE SCREENING TOOL     IDENTIFICATION  Patient Name: Steven Krause Birthdate: 12/20/1940 Sex: male Admission Date (Current Location): 09/04/2016  [facility] and [place] Number:  Herbalist and Address:  [facility] [place], [place] [address] [place], [place] [address]      Provider Number: [id number]  Attending Physician Name and Address:  Hosie Poisson, MD  Relative Name and Phone Number:       Current Level of Care: Hospital Recommended Level of Care: [place] (with palliative) Prior Approval Number:    Date Approved/Denied:   PASRR Number: [id number]  Discharge Plan: SNF (with palliative)    Current Diagnoses: Patient Active Problem List   Diagnosis Date Noted  . Palliative care by specialist 09/08/2016  . DNR (do not resuscitate) discussion 09/08/2016  . DNR (do not resuscitate)   . Dyspnea   . Pneumonia 09/04/2016  . Acute respiratory failure with hypoxia ([place]) 09/04/2016  . Mass of right lung 09/04/2016  . Diastolic heart failure ([place]) 09/04/2016  . Hyponatremia 09/04/2016  . Persistent atrial fibrillation ([place])   . Heme positive stool   . Iron deficiency anemia   . CAP (community acquired pneumonia) 05/04/2016  . Chest pain 05/04/2016  . Community acquired pneumonia 05/04/2016  . Acute CHF (congestive heart failure) ([place]) 05/04/2016  . Anemia 04/23/2016  . Blurry vision, right eye 04/23/2016  . Hyperlipidemia 10/19/2015  . Essential hypertension 09/02/2013  . Polycythemia, secondary 09/02/2013  . Hyperglycemia 05/27/2013  . Overweight (BMI 25.0-29.9) 05/27/2013  . Leg edema, left 02/19/2013  . Chronic atrial fibrillation ([place]) 02/14/2013  . Ventricular bigeminy 02/14/2013  . Obesity, unspecified 08/27/2012  . Urinary hesitancy 08/27/2012  . Memory loss   . Tobacco use disorder   . Hypopotassemia   . Acquired polycythemia 01/05/2011  . Iron excess 01/05/2011     Orientation RESPIRATION BLADDER Height & Weight     Self, Time, Situation, Place  O2 (Nasal Canula 4 L) Continent Weight: 212 lb 1.6 oz (96.2 kg) (Scale A) Height:  5\' 9"  (175.3 cm)  BEHAVIORAL SYMPTOMS/MOOD NEUROLOGICAL BOWEL NUTRITION STATUS   (None)  (None) Continent Diet (Regular)  AMBULATORY STATUS COMMUNICATION OF NEEDS Skin   Limited Assist Verbally Skin abrasions, Bruising                       Personal Care Assistance Level of Assistance  Bathing, Feeding, Dressing Bathing Assistance: Limited assistance Feeding assistance: Limited assistance Dressing Assistance: Limited assistance     Functional Limitations Info  Sight, Hearing, Speech Sight Info: Impaired (Blurry vision in right eye) Hearing Info: Adequate Speech Info: Adequate    SPECIAL CARE FACTORS FREQUENCY  PT (By licensed PT), Blood pressure, OT (By licensed OT)     PT Frequency: 5 x week OT Frequency: 5 x week            Contractures Contractures Info: Not present    Additional Factors Info  Code Status, Allergies Code Status Info: DNR Allergies Info: Statins           Current Medications (09/08/2016):  This is the current hospital active medication list Current Facility-Administered Medications  Medication Dose Route Frequency Provider Last Rate Last Dose  . amLODipine (NORVASC) tablet 10 mg  10 mg Oral Daily Rondel Jumbo, PA-C   10 mg at 09/08/16 0272  . apixaban (ELIQUIS) tablet 5 mg  5 mg Oral BID Shearon Stalls, Rahul P, PA-C  5 mg at 09/08/16 0925  . ezetimibe (ZETIA) tablet 10 mg  10 mg Oral Daily Rondel Jumbo, PA-C   10 mg at 09/08/16 3567  . guaiFENesin (MUCINEX) 12 hr tablet 600 mg  600 mg Oral BID PRN Rondel Jumbo, PA-C   600 mg at 09/08/16 0141  . HYDROcodone-acetaminophen (NORCO/VICODIN) 5-325 MG per tablet 1 tablet  1 tablet Oral Q6H PRN Rondel Jumbo, PA-C   1 tablet at 09/08/16 1012  . ipratropium-albuterol (DUONEB) 0.5-2.5 (3) MG/3ML nebulizer solution 3 mL  3 mL  Nebulization Q4H PRN Sharene Butters E, PA-C   3 mL at 09/08/16 1015  . iron polysaccharides (NIFEREX) capsule 150 mg  150 mg Oral Daily Rondel Jumbo, PA-C   150 mg at 09/08/16 0301  . MEDLINE mouth rinse  15 mL Mouth Rinse BID Truett Mainland, DO   15 mL at 09/08/16 3143  . pantoprazole (PROTONIX) EC tablet 40 mg  40 mg Oral Q0600 Rondel Jumbo, PA-C   40 mg at 09/08/16 8887  . piperacillin-tazobactam (ZOSYN) IVPB 3.375 g  3.375 g Intravenous Q8H Waldron Session, MD   Stopped at 09/08/16 [phone]  . tamsulosin (FLOMAX) capsule 0.4 mg  0.4 mg Oral QPM Rondel Jumbo, PA-C   0.4 mg at 09/07/16 1725     Discharge Medications: Please see discharge summary for a list of discharge medications.  Relevant Imaging Results:  Relevant Lab Results:   Additional Information SS#: [id number]  Candie Chroman, LCSW

## 2016-09-08 NOTE — Progress Notes (Signed)
COURTESY FOLLOW-UP: Patient's CEA is very elevated. This tells [place] if we are dealing with a lung primary it is very likely an adenocarcinoma.  The treatment of lung adenocarcinoma has recently been resolution eyes and many patients who have specific mutations may be treated very successfully with targeted agents, which are generally well tolerated.  Accordingly it will be imperative to obtain tissue for biopsy. I find only one sputum cytology has been sent. Sputum cytology is very insensitive. I recommend sending to more sputum cytology specimens as well as referral to pulmonary for definitive diagnosis.  If the tissue diagnosis is established or pulmonary specialist is Dr. Lorna Few and the patient should be referred to him for definitive therapy  We'll sign off at this point. Please let us know if we can be of further help

## 2016-09-08 NOTE — Clinical Social Work Placement (Signed)
   CLINICAL SOCIAL WORK PLACEMENT  NOTE  Date:  09/08/2016  Patient Details  Name: Steven Krause MRN: [id number] Date of Birth: 01/04/1941  Clinical Social Work is seeking post-discharge placement for this patient at the [place] level of care (*CSW will initial, date and re-position this form in  chart as items are completed):  Yes   Patient/family provided with [facility] Work Department's list of facilities offering this level of care within the geographic area requested by the patient (or if unable, by the patient's family).  Yes   Patient/family informed of their freedom to choose among providers that offer the needed level of care, that participate in Medicare, Medicaid or managed care program needed by the patient, have an available bed and are willing to accept the patient.  Yes   Patient/family informed of [facility]'s ownership interest in [facility] and [facility], as well as of the fact that they are under no obligation to receive care at these facilities.  PASRR submitted to EDS on 09/08/16     PASRR number received on 09/08/16     Existing PASRR number confirmed on       FL2 transmitted to all facilities in geographic area requested by pt/family on 09/08/16     FL2 transmitted to all facilities within larger geographic area on       Patient informed that his/her managed care company has contracts with or will negotiate with certain facilities, including the following:            Patient/family informed of bed offers received.  Patient chooses bed at       Physician recommends and patient chooses bed at      Patient to be transferred to   on  .  Patient to be transferred to facility by       Patient family notified on   of transfer.  Name of family member notified:        PHYSICIAN Please sign FL2, Please sign DNR     Additional Comment:    _______________________________________________ Candie Chroman,  LCSW 09/08/2016, 11:58 AM

## 2016-09-08 NOTE — Progress Notes (Addendum)
PROGRESS NOTE    Steven Krause  FUX:[id number] DOB: January 01, 1941 DOA: 09/04/2016 PCP: Gayland Curry, DO    Brief Narrative: 76 year old gentleman with prior h/o hypertension, tobacco abuse, presents with worsening SOB and recurrent pneumonia and RUL mass in the lung.    Assessment & Plan:   Active Problems:   Acquired polycythemia   Tobacco use disorder   Hypopotassemia   Obesity, unspecified   Chronic atrial fibrillation (HCC)   Ventricular bigeminy   Hyperglycemia   Overweight (BMI 25.0-29.9)   Essential hypertension   Hyperlipidemia   Anemia   CAP (community acquired pneumonia)   Pneumonia   Acute respiratory failure with hypoxia ([place])   Mass of right lung   Diastolic heart failure ([place])   Hyponatremia   Palliative care by specialist   DNR (do not resuscitate) discussion   DNR (do not resuscitate)   Dyspnea   Acute respiratory failure with hypoxia:  Secondary to copd/ obstructive pneumonia/ RUL mass with hilar and mediastinal adenopathy 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  On IV zosyn to complete the course.  Was scheduled to see PCCM as outpatient but he couldn't keep the appt and got re admitted.  PCCM consulted to see if he needs a bronch for evaluation of malignancy. Unfortunately, the LN are not accessible by bronchoscopy.  Blood cultures are negative.  Sputum cultures show normal flora.  Cytology from sputum pending and a second one sent today.  Recommend to see Dr Ashok Cordia on 7/12 .  Called Dr Julien Nordmann with oncology who will see the patient tomorrow.  Repeat CXR shows worsening of the right upper lobe consolidation. Discussed the results with the patient and palliative care.  He currents does not want any CPR or intubation, wants DNR.    Hypertension:  Controlled.    Mild normocytic anemia:  Hemoglobin stable around 10  Hypokalemia: repleted.     Mild acute on Chronic diastolic heart failure: he has pedal edema, small pleral  effusion, requiring about 4 lit of [place] Oxygen today.  Will start him on IV lasix 40 mg daily . Last echo from 05/2016 shows grade 2 diastolic dysfunction.  Will get strict intake and output.  bnp pending.     Hyponatremia: resolved.   Chronic atrial fibrillation; rate controlled.  Resume eliquis.    In view of his social condition and multiple co morbidities palliative care consulted and recommendations in chart.    DVT prophylaxis: eliquis.  Code Status: DNR Family Communication: none at bedside. Discussed the plan of care with the patient.  Disposition Plan: to SNF in 1 to 2 days.    Consultants:  Oncology PCCM.    Procedures: none.    Antimicrobials:zosyn since admission.    Subjective: Reports some sob on exertion.  No chest pain , no palpitations.   Objective: Vitals:   09/07/16 1158 09/07/16 2152 09/08/16 0534 09/08/16 1152  BP: 133/68 133/64 130/67 136/61  Pulse: (!) 57 60 63 (!) 49  Resp: 18 18 17 18   Temp: 97.9 F (36.6 C) 97.8 F (36.6 C) 98 F (36.7 C) 98 F (36.7 C)  TempSrc: Oral Oral Oral Oral  SpO2: 93% 97% 92% 94%  Weight:   96.2 kg (212 lb 1.6 oz)   Height:        Intake/Output Summary (Last 24 hours) at 09/08/16 1600 Last data filed at 09/08/16 1500  Gross per 24 hour  Intake  655 ml  Output              525 ml  Net              130 ml   Filed Weights   09/06/16 0425 09/07/16 0253 09/08/16 0534  Weight: 93.3 kg (205 lb 9.6 oz) 95.4 kg (210 lb 4.8 oz) 96.2 kg (212 lb 1.6 oz)    Examination:  General exam: Appears in mild distress from sob. On 4 lit of [place] oxygen.  Respiratory system:  Scattered wheezing anteriorly. Diminished air entry at bases.  Cardiovascular system: S1 & S2 heard, RRR.  Gastrointestinal system: Abdomen is nondistended, soft and nontender. No organomegaly or masses felt. Normal bowel sounds heard. Central nervous system: Alert and oriented. No focal neurological deficits. Extremities: 2+ pedal edema.    Skin: No rashes, lesions or ulcers Psychiatry: Judgement and insight appear normal. Mood & affect appropriate.     Data Reviewed: I have personally reviewed following labs and imaging studies  CBC:  Recent Labs Lab 09/04/16 1059 09/05/16 0354 09/07/16 0305  WBC 7.9 7.9 8.6  NEUTROABS 6.7  --   --   HGB 11.5* 10.4* 10.8*  HCT 37.5* 34.6* 37.2*  MCV 81.0 81.8 83.6  PLT 263 259 169   Basic Metabolic Panel:  Recent Labs Lab 09/04/16 1059 09/05/16 0354 09/06/16 0341 09/07/16 0305  NA 133* 134*  --  135  K 3.3* 3.2*  --  3.7  CL 96* 98*  --  98*  CO2 26 30  --  31  GLUCOSE 117* 139*  --  121*  BUN 13 12  --  12  CREATININE 0.96 0.92  --  0.93  CALCIUM 8.9 8.1*  --  8.5*  MG  --   --  1.8  --    GFR: Estimated Creatinine Clearance: 78.5 mL/min (by C-G formula based on SCr of 0.93 mg/dL). Liver Function Tests:  Recent Labs Lab 09/05/16 0354 09/07/16 0305  AST 134* 168*  ALT 41 48  ALKPHOS 228* 290*  BILITOT 0.7 0.8  PROT 6.8 7.0  ALBUMIN 2.7* 2.7*   No results for input(s): LIPASE, AMYLASE in the last 168 hours. No results for input(s): AMMONIA in the last 168 hours. Coagulation Profile: No results for input(s): INR, PROTIME in the last 168 hours. Cardiac Enzymes: No results for input(s): CKTOTAL, CKMB, CKMBINDEX, TROPONINI in the last 168 hours. BNP (last 3 results) No results for input(s): PROBNP in the last 8760 hours. HbA1C: No results for input(s): HGBA1C in the last 72 hours. CBG: No results for input(s): GLUCAP in the last 168 hours. Lipid Profile: No results for input(s): CHOL, HDL, LDLCALC, TRIG, CHOLHDL, LDLDIRECT in the last 72 hours. Thyroid Function Tests: No results for input(s): TSH, T4TOTAL, FREET4, T3FREE, THYROIDAB in the last 72 hours. Anemia Panel: No results for input(s): VITAMINB12, FOLATE, FERRITIN, TIBC, IRON, RETICCTPCT in the last 72 hours. Sepsis Labs:  Recent Labs Lab 09/04/16 2158 09/05/16 0055 09/05/16 0354  09/05/16 6789  LATICACIDVEN 2.5* 1.9 2.5* 1.7    Recent Results (from the past 240 hour(s))  Blood Culture (routine x 2)     Status: None (Preliminary result)   Collection Time: 09/04/16 11:33 AM  Result Value Ref Range Status   Specimen Description BLOOD LEFT FOREARM  Final   Special Requests   Final    BOTTLES DRAWN AEROBIC AND ANAEROBIC Blood Culture adequate volume   Culture NO GROWTH 4 DAYS  Final   Report  Status PENDING  Incomplete  Blood Culture (routine x 2)     Status: None (Preliminary result)   Collection Time: 09/04/16 11:35 AM  Result Value Ref Range Status   Specimen Description BLOOD LEFT ANTECUBITAL  Final   Special Requests   Final    BOTTLES DRAWN AEROBIC AND ANAEROBIC Blood Culture adequate volume   Culture NO GROWTH 4 DAYS  Final   Report Status PENDING  Incomplete  Urine culture     Status: Abnormal   Collection Time: 09/04/16 12:40 PM  Result Value Ref Range Status   Specimen Description URINE, RANDOM  Final   Special Requests NONE  Final   Culture <10,000 COLONIES/mL INSIGNIFICANT GROWTH (A)  Final   Report Status 09/05/2016 FINAL  Final  Culture, sputum-assessment     Status: None   Collection Time: 09/04/16  7:55 PM  Result Value Ref Range Status   Specimen Description SPUTUM  Final   Special Requests NONE  Final   Sputum evaluation THIS SPECIMEN IS ACCEPTABLE FOR SPUTUM CULTURE  Final   Report Status 09/05/2016 FINAL  Final  Culture, respiratory (NON-Expectorated)     Status: None   Collection Time: 09/04/16  7:55 PM  Result Value Ref Range Status   Specimen Description SPUTUM  Final   Special Requests NONE Reflexed from [id number]  Final   Gram Stain   Final    RARE WBC PRESENT, PREDOMINANTLY PMN FEW GRAM POSITIVE COCCI IN PAIRS FEW GRAM VARIABLE ROD FEW GRAM NEGATIVE RODS    Culture Consistent with normal respiratory flora.  Final   Report Status 09/07/2016 FINAL  Final         Radiology Studies: Dg Chest Port 1 View  Result Date:  09/07/2016 CLINICAL DATA:  Hypoxia EXAM: PORTABLE CHEST 1 VIEW COMPARISON:  Chest CT 09/06/2016 Chest radiograph 09/05/2016 FINDINGS: There is continued consolidation of the lower portion of the right upper lobe with prominence of the right hilum consistent with hilar mass. There is a small right pleural effusion. Mild airspace opacities in the right lower lobe. Left lung is clear. No pneumothorax. IMPRESSION: Partial right upper lobe consolidation, likely postobstructive in the setting of right hilar mass. The findings have slightly worsened from the prior chest radiograph of 09/05/2016. Small right pleural effusion. Electronically Signed   By: Ulyses Jarred M.D.   On: 09/07/2016 06:10        Scheduled Meds: . amLODipine  10 mg Oral Daily  . apixaban  5 mg Oral BID  . ezetimibe  10 mg Oral Daily  . iron polysaccharides  150 mg Oral Daily  . mouth rinse  15 mL Mouth Rinse BID  . pantoprazole  40 mg Oral Q0600  . tamsulosin  0.4 mg Oral QPM   Continuous Infusions: . piperacillin-tazobactam (ZOSYN)  IV 3.375 g (09/08/16 1352)     LOS: 3 days    Time spent: 45 minutes.     Hosie Poisson, MD [facility] Pager [phone] If 7PM-7AM, please contact night-coverage [email or web address] Password [email or web address] 09/08/2016, 4:00 PM

## 2016-09-08 NOTE — Progress Notes (Signed)
Patient ID: Steven Krause, male   DOB: [birth year], 76 y.o.   MRN: [id number]  This NP visited patient at the bedside as a follow up to  yesterday's [place].  Continued conversation regarding diagnosis, diagnostics and likely transitions of care for discharge.   He is open to SNF for rehab and may need long term placement ( he has really no social support).  I recommend palliative services on discharge.   Revisted code status and he is in agreement with DNR/DNI, will document today.  Placed call to sister and updated on current situation, she expresses appreciation for any help provided to her brother, she is unable to assist in any way 2/2 to her own medical conditions.   Discussed with patient and sister  the importance of continued conversation with family and their  medical providers regarding overall plan of care and treatment options,  ensuring decisions are within the context of the patients values and GOCs.  Questions and concerns addressed   Discussed with Dr Karleen Hampshire  Time in 0930          Time out 1095     Total time  35 min spent on the unit  Greater than 50% of the time was spent in counseling and coordination of care  Steven Lessen NP  Palliative Medicine Team Team Phone # [phone] Pager [phone]

## 2016-09-09 ENCOUNTER — Encounter (HOSPITAL_COMMUNITY): Payer: Self-pay | Admitting: General Surgery

## 2016-09-09 ENCOUNTER — Inpatient Hospital Stay (HOSPITAL_COMMUNITY): Payer: Medicare Other

## 2016-09-09 DIAGNOSIS — C787 Secondary malignant neoplasm of liver and intrahepatic bile duct: Secondary | ICD-10-CM

## 2016-09-09 DIAGNOSIS — C3411 Malignant neoplasm of upper lobe, right bronchus or lung: Secondary | ICD-10-CM

## 2016-09-09 DIAGNOSIS — R0609 Other forms of dyspnea: Secondary | ICD-10-CM

## 2016-09-09 HISTORY — PX: IR THORACENTESIS RIGHT ASP PLEURAL SPACE W/IMG GUIDE: IMG5380

## 2016-09-09 LAB — CULTURE, BLOOD (ROUTINE X 2)
CULTURE: NO GROWTH
Culture: NO GROWTH
SPECIAL REQUESTS: ADEQUATE
Special Requests: ADEQUATE

## 2016-09-09 LAB — BASIC METABOLIC PANEL
ANION GAP: 6 (ref 5–15)
BUN: 17 mg/dL (ref 6–20)
CALCIUM: 8.7 mg/dL — AB (ref 8.9–10.3)
CO2: 34 mmol/L — ABNORMAL HIGH (ref 22–32)
Chloride: 92 mmol/L — ABNORMAL LOW (ref 101–111)
Creatinine, Ser: 0.95 mg/dL (ref 0.61–1.24)
GFR calc Af Amer: >60 mL/min (ref >60)
Glucose, Bld: 146 mg/dL — ABNORMAL HIGH (ref 65–99)
Potassium: 3.7 mmol/L (ref 3.5–5.1)
Sodium: 132 mmol/L — ABNORMAL LOW (ref 135–145)

## 2016-09-09 MED ORDER — LIDOCAINE HCL (PF) 1 % IJ SOLN
INTRAMUSCULAR | Status: AC
  Filled 2016-09-09: qty 30

## 2016-09-09 MED ORDER — LIDOCAINE HCL (PF) 1 % IJ SOLN
INTRAMUSCULAR | Status: DC | PRN
  Administered 2016-09-09: 5 mL

## 2016-09-09 NOTE — Progress Notes (Signed)
PROGRESS NOTE    Steven Krause  NTZ:[id number] DOB: 1940-09-04 DOA: 09/04/2016 PCP: Gayland Curry, DO    Brief Narrative: 76 year old gentleman with prior h/o hypertension, tobacco abuse, presents with worsening SOB and recurrent pneumonia and RUL mass in the lung.    Assessment & Plan:   Active Problems:   Acquired polycythemia   Tobacco use disorder   Hypopotassemia   Obesity, unspecified   Chronic atrial fibrillation (HCC)   Ventricular bigeminy   Hyperglycemia   Overweight (BMI 25.0-29.9)   Essential hypertension   Hyperlipidemia   Anemia   CAP (community acquired pneumonia)   Pneumonia   Acute respiratory failure with hypoxia ([place])   Mass of right lung   Diastolic heart failure ([place])   Hyponatremia   Palliative care by specialist   DNR (do not resuscitate) discussion   DNR (do not resuscitate)   Dyspnea   Acute respiratory failure with hypoxia:  Secondary to copd/ obstructive pneumonia/ RUL mass with hilar and mediastinal adenopathy 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  On IV zosyn to complete the course.  Was scheduled to see PCCM as outpatient but he couldn't keep the appt and got re admitted.  PCCM consulted to see if he needs a bronch for evaluation of malignancy. Unfortunately, the LN are not accessible by bronchoscopy.  Blood cultures are negative.  Sputum cultures show normal flora.  Cytology from sputum from 7/3 is negative, repeat sputum cytology ordered.  Recommend to see Dr Ashok Cordia on 7/12 .  Called Dr Julien Nordmann with oncology who saw the patient today, awaiting recommendations.  Repeat CXR shows worsening of the right upper lobe consolidation. Discussed the results with the patient and palliative care.  He currents does not want any CPR or intubation, wants DNR.    Hypertension:  Controlled.    Mild normocytic anemia:  Hemoglobin stable around 10  Hypokalemia: repleted.     Mild acute on Chronic diastolic heart  failure: he has pedal edema, small pleral effusion, requiring about 4 lit of [place] Oxygen , working with PT. Started on IV lasix 40 mg daily . Last echo from 05/2016 shows grade 2 diastolic dysfunction.  Will get strict intake and output.  bnp 290.    Hyponatremia: resolved.   Chronic atrial fibrillation; rate controlled.  Resume eliquis.    In view of his social condition and multiple co morbidities palliative care consulted and recommendations in chart.    DVT prophylaxis: eliquis.  Code Status: DNR Family Communication: none at bedside. Discussed the plan of care with the patient.  Disposition Plan: to SNF in 1 to 2 days after sputum cytology from today is back.    Consultants:  Oncology PCCM.    Procedures: none.    Antimicrobials:zosyn since admission.    Subjective: Reports some sob on exertion.  No chest pain , no palpitations.  Working with PT.   Objective: Vitals:   09/08/16 2049 09/09/16 0344 09/09/16 1210 09/09/16 1300  BP: (!) 149/83 (!) 141/73 125/82 139/65  Pulse: 77 61 65   Resp: 18 18 18    Temp: 98.1 F (36.7 C) 98.5 F (36.9 C) (!) 97.5 F (36.4 C)   TempSrc: Oral Oral Oral   SpO2: 96% 96% 91%   Weight:  97.7 kg (215 lb 6.4 oz)    Height:        Intake/Output Summary (Last 24 hours) at 09/09/16 1440 Last data filed at 09/09/16 1352  Gross per 24 hour  Intake  1300 ml  Output             1056 ml  Net              244 ml   Filed Weights   09/07/16 0253 09/08/16 0534 09/09/16 0344  Weight: 95.4 kg (210 lb 4.8 oz) 96.2 kg (212 lb 1.6 oz) 97.7 kg (215 lb 6.4 oz)    Examination:  General exam: Appears in mild distress from sob. On 4 lit of [place] oxygen.  Respiratory system:  Scattered wheezing anteriorly. Diminished air entry at bases. NO RHONCHI.  Cardiovascular system: S1 & S2 heard, RRR.  Gastrointestinal system: Abdomen is nondistended, soft and nontender. No organomegaly or masses felt. Normal bowel sounds heard. Central  nervous system: Alert and oriented. No focal neurological deficits. Extremities: 2+ pedal edema.  Skin: No rashes, lesions or ulcers Psychiatry: . Mood & affect appropriate.     Data Reviewed: I have personally reviewed following labs and imaging studies  CBC:  Recent Labs Lab 09/04/16 1059 09/05/16 0354 09/07/16 0305  WBC 7.9 7.9 8.6  NEUTROABS 6.7  --   --   HGB 11.5* 10.4* 10.8*  HCT 37.5* 34.6* 37.2*  MCV 81.0 81.8 83.6  PLT 263 259 371   Basic Metabolic Panel:  Recent Labs Lab 09/04/16 1059 09/05/16 0354 09/06/16 0341 09/07/16 0305 09/09/16 0335  NA 133* 134*  --  135 132*  K 3.3* 3.2*  --  3.7 3.7  CL 96* 98*  --  98* 92*  CO2 26 30  --  31 34*  GLUCOSE 117* 139*  --  121* 146*  BUN 13 12  --  12 17  CREATININE 0.96 0.92  --  0.93 0.95  CALCIUM 8.9 8.1*  --  8.5* 8.7*  MG  --   --  1.8  --   --    GFR: Estimated Creatinine Clearance: 77.4 mL/min (by C-G formula based on SCr of 0.95 mg/dL). Liver Function Tests:  Recent Labs Lab 09/05/16 0354 09/07/16 0305  AST 134* 168*  ALT 41 48  ALKPHOS 228* 290*  BILITOT 0.7 0.8  PROT 6.8 7.0  ALBUMIN 2.7* 2.7*   No results for input(s): LIPASE, AMYLASE in the last 168 hours. No results for input(s): AMMONIA in the last 168 hours. Coagulation Profile: No results for input(s): INR, PROTIME in the last 168 hours. Cardiac Enzymes: No results for input(s): CKTOTAL, CKMB, CKMBINDEX, TROPONINI in the last 168 hours. BNP (last 3 results) No results for input(s): PROBNP in the last 8760 hours. HbA1C: No results for input(s): HGBA1C in the last 72 hours. CBG: No results for input(s): GLUCAP in the last 168 hours. Lipid Profile: No results for input(s): CHOL, HDL, LDLCALC, TRIG, CHOLHDL, LDLDIRECT in the last 72 hours. Thyroid Function Tests: No results for input(s): TSH, T4TOTAL, FREET4, T3FREE, THYROIDAB in the last 72 hours. Anemia Panel: No results for input(s): VITAMINB12, FOLATE, FERRITIN, TIBC, IRON,  RETICCTPCT in the last 72 hours. Sepsis Labs:  Recent Labs Lab 09/04/16 2158 09/05/16 0055 09/05/16 0354 09/05/16 6967  LATICACIDVEN 2.5* 1.9 2.5* 1.7    Recent Results (from the past 240 hour(s))  Blood Culture (routine x 2)     Status: None   Collection Time: 09/04/16 11:33 AM  Result Value Ref Range Status   Specimen Description BLOOD LEFT FOREARM  Final   Special Requests   Final    BOTTLES DRAWN AEROBIC AND ANAEROBIC Blood Culture adequate volume   Culture NO GROWTH  5 DAYS  Final   Report Status 09/09/2016 FINAL  Final  Blood Culture (routine x 2)     Status: None   Collection Time: 09/04/16 11:35 AM  Result Value Ref Range Status   Specimen Description BLOOD LEFT ANTECUBITAL  Final   Special Requests   Final    BOTTLES DRAWN AEROBIC AND ANAEROBIC Blood Culture adequate volume   Culture NO GROWTH 5 DAYS  Final   Report Status 09/09/2016 FINAL  Final  Urine culture     Status: Abnormal   Collection Time: 09/04/16 12:40 PM  Result Value Ref Range Status   Specimen Description URINE, RANDOM  Final   Special Requests NONE  Final   Culture <10,000 COLONIES/mL INSIGNIFICANT GROWTH (A)  Final   Report Status 09/05/2016 FINAL  Final  Culture, sputum-assessment     Status: None   Collection Time: 09/04/16  7:55 PM  Result Value Ref Range Status   Specimen Description SPUTUM  Final   Special Requests NONE  Final   Sputum evaluation THIS SPECIMEN IS ACCEPTABLE FOR SPUTUM CULTURE  Final   Report Status 09/05/2016 FINAL  Final  Culture, respiratory (NON-Expectorated)     Status: None   Collection Time: 09/04/16  7:55 PM  Result Value Ref Range Status   Specimen Description SPUTUM  Final   Special Requests NONE Reflexed from [id number]  Final   Gram Stain   Final    RARE WBC PRESENT, PREDOMINANTLY PMN FEW GRAM POSITIVE COCCI IN PAIRS FEW GRAM VARIABLE ROD FEW GRAM NEGATIVE RODS    Culture Consistent with normal respiratory flora.  Final   Report Status 09/07/2016 FINAL   Final         Radiology Studies: No results found.      Scheduled Meds: . amLODipine  10 mg Oral Daily  . apixaban  5 mg Oral BID  . ezetimibe  10 mg Oral Daily  . iron polysaccharides  150 mg Oral Daily  . lidocaine (PF)      . mouth rinse  15 mL Mouth Rinse BID  . pantoprazole  40 mg Oral Q0600  . tamsulosin  0.4 mg Oral QPM   Continuous Infusions: . piperacillin-tazobactam (ZOSYN)  IV Stopped (09/09/16 1028)     LOS: 4 days    Time spent: 35 minutes.     Hosie Poisson, MD [facility] Pager [phone] If 7PM-7AM, please contact night-coverage [email or web address] Password [email or web address] 09/09/2016, 2:40 PM

## 2016-09-09 NOTE — Progress Notes (Signed)
Physical Therapy Treatment Patient Details Name: Steven Krause MRN: [id number] DOB: February 10, 1941 Today's Date: 09/09/2016    History of Present Illness Steven Krause is an 76 y.o. male past medical history significant for chronic diastolic heart failure, atrial fibrillation on Eluquis iron deficiency anemia recently hospitalized on February for pneumonia and a known middle lung nodule comes in for progressive cough that started 3 weeks prior to admission.  Imaging showed CAP; workup continues    PT Comments    Patient is making progress toward mobility goals. Continues to desat on RA at 83% prior to mobilizing. Pt required 4L O2 via [place] while ambulating to maintain SpO2 90%. Pt educated to perform pursed lip breathing and required frequent cues to breath through nose instead of mouth. Continue to progress as tolerated with anticipated d/c to SNF for further skilled PT services.     Follow Up Recommendations  SNF     Equipment Recommendations  Rolling walker with 5" wheels    Recommendations for Other Services       Precautions / Restrictions Precautions Precautions: Fall    Mobility  Bed Mobility               General bed mobility comments: pt sitting EOB upon arrival  Transfers Overall transfer level: Needs assistance Equipment used: Rolling walker (2 wheeled) Transfers: Sit to/from Stand Sit to Stand: Min guard         General transfer comment: min guard for safety; cues for hand placement  Ambulation/Gait Ambulation/Gait assistance: Min guard Ambulation Distance (Feet):  (49ft X2) Assistive device: Rolling walker (2 wheeled) Gait Pattern/deviations: Step-through pattern;Decreased stride length;Trunk flexed Gait velocity: decreased   General Gait Details: cues for safe use of AD; 2/4 DOE; standing rest break; 4L O2 via [place] required to maintain SpO2 90%   Stairs            Wheelchair Mobility    Modified Rankin (Stroke Patients Only)        Balance Overall balance assessment: Needs assistance Sitting-balance support: No upper extremity supported Sitting balance-Leahy Scale: Good     Standing balance support: Bilateral upper extremity supported Standing balance-Leahy Scale: Fair                              Cognition Arousal/Alertness: Awake/alert Behavior During Therapy: WFL for tasks assessed/performed Overall Cognitive Status: Within Functional Limits for tasks assessed                                        Exercises      General Comments General comments (skin integrity, edema, etc.): SpO2 desat on RA at 83%       Pertinent Vitals/Pain Pain Assessment: No/denies pain    Home Living                      Prior Function            PT Goals (current goals can now be found in the care plan section) Acute Rehab PT Goals Patient Stated Goal: back home at some point PT Goal Formulation: With patient Time For Goal Achievement: 09/21/16 Potential to Achieve Goals: Fair Progress towards PT goals: Progressing toward goals    Frequency    Min 3X/week      PT Plan Current plan remains appropriate    Co-evaluation  AM-PAC PT "6 Clicks" Daily Activity  Outcome Measure  Difficulty turning over in bed (including adjusting bedclothes, sheets and blankets)?: A Little Difficulty moving from lying on back to sitting on the side of the bed? : A Little Difficulty sitting down on and standing up from a chair with arms (e.g., wheelchair, bedside commode, etc,.)?: A Little Help needed moving to and from a bed to chair (including a wheelchair)?: A Little Help needed walking in hospital room?: A Little Help needed climbing 3-5 steps with a railing? : A Lot 6 Click Score: 17    End of Session Equipment Utilized During Treatment: Oxygen;Gait belt Activity Tolerance: Patient tolerated treatment well;Patient limited by fatigue Patient left: with call  bell/phone within reach;in chair Nurse Communication: Mobility status PT Visit Diagnosis: Unsteadiness on feet (R26.81);Other abnormalities of gait and mobility (R26.89)     Time: 1031-5945 PT Time Calculation (min) (ACUTE ONLY): 30 min  Charges:  $Gait Training: 8-22 mins $Therapeutic Activity: 8-22 mins                    G Codes:       Earney Navy, PTA Pager: [phone]     Darliss Cheney 09/09/2016, 1:46 PM

## 2016-09-09 NOTE — Care Management Important Message (Signed)
Important Message  Patient Details  Name: Steven Krause MRN: [id number] Date of Birth: 06-16-1940   Medicare Important Message Given:  Yes    [name] 09/09/2016, 12:03 PM

## 2016-09-09 NOTE — Procedures (Addendum)
Ultrasound-guided diagnostic and therapeutic right thoracentesis performed yielding 1.1 liters of serous colored fluid. No immediate complications. Follow-up chest x-ray pending.       [name] E 3:02 PM 09/09/2016

## 2016-09-09 NOTE — Progress Notes (Signed)
[place] [phone]   [phone]  CONSULT NOTE  REFERRING PHYSICIAN: Dr. Hosie Poisson  REASON FOR CONSULTATION:  76 years old white male with questionable lung cancer.  HPI Steven Krause is a 76 y.o. male with past medical history significant for multiple medical problems including secondary polycythemia, hypertension, hypokalemia, iron metabolism disorder, cardiac dysrhythmia as well as long history of smoking. The patient was admitted to [facility] on 09/04/2016 complaining of for sending shortness of breath. He mentions that in February 2018 he was admitted to the hospital with pneumonia and during his evaluation CT angiogram of the chest was performed on 05/04/2013 and it showed irregular 2.3 cm right upper lobe pulmonary nodule suspicious for primary bronchogenic malignancy. There was also bulky right hilar and mild mediastinal lymphadenopathy in addition to right upper lobe perihilar groundglass opacities. I'm not sure what happened to the patient since that time but I don't see any any further workup for this nodule. He was admitted a few days ago with 3 weeks of progressive dyspnea as well as cough with dark sputum and no blood. Repeat CT scan of the chest on 09/06/2016 showed markedly worsening of the right upper lobe radiation with partial opacification by masslike soft tissue density without air bronchograms. There was also dependent right lower lobe atelectasis and similar right lower lobe nodularity including 7 mm. The scan also showed progressive thoracic adenopathy including precarinal node measuring 2.7 cm. There was direct tumor extension into the right side of the mediastinum and right hilar involvement by tumor and/or adenopathy. Scan of the upper abdomen showed development of heterogeneous density throughout the liver most consistent with widespread metastatic disease. The patient was seen by pulmonary medicine but bronchoscopy was  considered high risk for this patient. He had sputum cytology that was negative so far. I was asked to see the patient today for evaluation and recommendation regarding treatment of his condition. When seen today the patient was so sleepy this morning. He was able to give part of the history and the remaining part was obtained from the medical record. He denied having any chest pain but continues to have shortness breath with cough and no hemoptysis. He has abdominal distention. He denied having any fever or chills.  HPI  Past Medical History:  Diagnosis Date  . Abdominal pain, generalized   . Acquired polycythemia 01/05/2011  . Allergy   . Diarrhea   . Dizziness and giddiness   . Hernia of unspecified site of abdominal cavity without mention of obstruction or gangrene   . Hypertension   . Hypopotassemia   . Iron excess 01/05/2011  . Memory loss   . Other specified cardiac dysrhythmias(427.89)   . 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 Polycythemia, secondary   . Systemic hypertension   . Tobacco use disorder   . Unspecified disorder of kidney and ureter   . Unspecified disorder of skin and subcutaneous tissue   . Unspecified hearing loss     Past Surgical History:  Procedure Laterality Date  . CHOLECYSTECTOMY    . COLONOSCOPY N/A 05/10/2016   Procedure: COLONOSCOPY;  Surgeon: Manus Gunning, MD;  Location: [place];  Service: Gastroenterology;  Laterality: N/A;  . ESOPHAGOGASTRODUODENOSCOPY N/A 05/10/2016   Procedure: ESOPHAGOGASTRODUODENOSCOPY (EGD);  Surgeon: Manus Gunning, MD;  Location: [place];  Service: Gastroenterology;  Laterality: N/A;  . GIVENS CAPSULE STUDY N/A 05/10/2016   Procedure: GIVENS CAPSULE STUDY;  Surgeon: Manus Gunning, MD;  Location: [facility]  ENDOSCOPY;  Service: Gastroenterology;  Laterality: N/A;  . INFLAMED COLON    . LEFT HEART CATH AND CORONARY ANGIOGRAPHY N/A 05/06/2016   Procedure: Left Heart Cath and Coronary Angiography;  Surgeon:  Peter M Martinique, MD;  Location: [place] CV LAB;  Service: Cardiovascular;  Laterality: N/A;  . NM MYOCAR PERF WALL MOTION  8//30/11   normal  . US ECHOCARDIOGRAPHY  11/03/09   EF 50-55%,  . VENTRAL HERNIA REPAIR      Family History  Problem Relation Age of Onset  . Colon polyps Mother 39  . Colon cancer Mother   . Heart disease Father   . Diabetes Sister   . Cancer Brother   . Diabetes Sister   . Diabetes Sister   . Heart disease Sister   . Stomach cancer Neg Hx   . Rectal cancer Neg Hx     Social History Social History  Substance Use Topics  . Smoking status: Former Smoker    Packs/day: 0.25    Years: 55.00    Types: Pipe    Quit date: 05/01/2016  . Smokeless tobacco: Never Used  . Alcohol use No    Allergies  Allergen Reactions  . Statins Nausea And Vomiting    Current Facility-Administered Medications  Medication Dose Route Frequency Provider Last Rate Last Dose  . amLODipine (NORVASC) tablet 10 mg  10 mg Oral Daily Rondel Jumbo, PA-C   10 mg at 09/08/16 0932  . apixaban (ELIQUIS) tablet 5 mg  5 mg Oral BID Shearon Stalls, Rahul P, PA-C   5 mg at 09/08/16 2133  . ezetimibe (ZETIA) tablet 10 mg  10 mg Oral Daily Rondel Jumbo, PA-C   10 mg at 09/08/16 3557  . guaiFENesin (MUCINEX) 12 hr tablet 600 mg  600 mg Oral BID PRN Rondel Jumbo, PA-C   600 mg at 09/08/16 3220  . HYDROcodone-acetaminophen (NORCO/VICODIN) 5-325 MG per tablet 1 tablet  1 tablet Oral Q6H PRN Rondel Jumbo, PA-C   1 tablet at 09/08/16 1718  . ipratropium-albuterol (DUONEB) 0.5-2.5 (3) MG/3ML nebulizer solution 3 mL  3 mL Nebulization Q4H PRN Sharene Butters E, PA-C   3 mL at 09/08/16 1015  . iron polysaccharides (NIFEREX) capsule 150 mg  150 mg Oral Daily Rondel Jumbo, PA-C   150 mg at 09/08/16 2542  . MEDLINE mouth rinse  15 mL Mouth Rinse BID Truett Mainland, DO   15 mL at 09/08/16 2134  . pantoprazole (PROTONIX) EC tablet 40 mg  40 mg Oral Q0600 Rondel Jumbo, PA-C   40 mg at 09/09/16 7062   . piperacillin-tazobactam (ZOSYN) IVPB 3.375 g  3.375 g Intravenous Mariana Kaufman, Eiad, MD 12.5 mL/hr at 09/09/16 0620 3.375 g at 09/09/16 0620  . tamsulosin (FLOMAX) capsule 0.4 mg  0.4 mg Oral QPM Rondel Jumbo, PA-C   0.4 mg at 09/08/16 1718    Review of Systems  Constitutional: positive for anorexia and fatigue Eyes: negative Ears, nose, mouth, throat, and face: negative Respiratory: positive for cough, dyspnea on exertion, emphysema and sputum Cardiovascular: negative Gastrointestinal: negative Genitourinary:negative Integument/breast: negative Hematologic/lymphatic: negative Musculoskeletal:positive for muscle weakness Neurological: negative Behavioral/Psych: negative Endocrine: negative Allergic/Immunologic: negative  Physical Exam  BJS:EGBTD, healthy, no distress, well nourished and well developed SKIN: skin color, texture, turgor are normal, no rashes or significant lesions HEAD: Normocephalic EYES: normal, PERRLA, Conjunctiva are pink and non-injected EARS: External ears normal, Canals clear OROPHARYNX:no exudate, no erythema and lips, buccal mucosa, and tongue  normal  NECK: supple, no adenopathy, no JVD LYMPH:  no palpable lymphadenopathy, no hepatosplenomegaly LUNGS: decreased breath sounds, expiratory wheezes bilaterally, scattered rales bilaterally HEART: regular rate & rhythm, no murmurs and no gallops ABDOMEN:abdomen soft, non-tender, obese, normal bowel sounds and no masses or organomegaly BACK: Back symmetric, no curvature., No CVA tenderness EXTREMITIES:no joint deformities, effusion, or inflammation, no edema, no skin discoloration  NEURO: alert & oriented x 3 with fluent speech, no focal motor/sensory deficits  PERFORMANCE STATUS: ECOG 1-2  LABORATORY DATA: Lab Results  Component Value Date   WBC 8.6 09/07/2016   HGB 10.8 (L) 09/07/2016   HCT 37.2 (L) 09/07/2016   MCV 83.6 09/07/2016   PLT 269 09/07/2016    @LASTCHEM @  RADIOGRAPHIC  STUDIES: Dg Chest 2 View  Result Date: 09/05/2016 CLINICAL DATA:  Pneumonia with shortness of breath EXAM: CHEST  2 VIEW COMPARISON:  September 04, 2016 chest radiograph and chest CT May 04, 2016 FINDINGS: There is persistent airspace consolidation in the anterior segment right upper lobe with without appreciable change from 1 day prior. There is new focal airspace opacity in the lateral right base with minimal right pleural effusion. Left lung is clear. Heart is upper normal in size with mild pulmonary venous hypertension. No adenopathy evident. No bone lesions. IMPRESSION: Extensive airspace consolidation anterior segment right upper lobe, stable from 1 day prior and consistent with pneumonia. New focal presumed pneumonia lateral right base. New small right pleural effusion. Left lung unchanged. Stable cardiac silhouette with a degree of pulmonary vascular congestion. Followup PA and lateral chest radiographs recommended in 3-4 weeks following trial of antibiotic therapy to ensure resolution and exclude underlying malignancy. Electronically Signed   By: Lowella Grip III M.D.   On: 09/05/2016 07:49   Dg Chest 2 View  Result Date: 09/04/2016 CLINICAL DATA:  Productive cough and fever. EXAM: CHEST  2 VIEW COMPARISON:  05/04/2016 FINDINGS: Heart size is normal. Parenchymal consolidation is seen in the anterior right upper lobe which is new since prior study. This is suspicious for pneumonia. No evidence of pleural effusion. Mild scarring in right lung base is stable since previous exam. IMPRESSION: New parenchymal consolidation in the anterior right upper lobe, suspicious for pneumonia. Followup PA and lateral chest X-ray is recommended in 3-4 weeks following trial of antibiotic therapy to ensure resolution and exclude underlying malignancy. Electronically Signed   By: Earle Gell M.D.   On: 09/04/2016 10:47   Ct Chest W Contrast  Result Date: 09/06/2016 CLINICAL DATA:  Short of breath on exertion for 6  months. Cough for 3 months. Ex-smoker, quitting in February. EXAM: CT CHEST WITH CONTRAST TECHNIQUE: Multidetector CT imaging of the chest was performed during intravenous contrast administration. CONTRAST:  48mL ISOVUE-300 IOPAMIDOL (ISOVUE-300) INJECTION 61% COMPARISON:  Plain films including 1 day prior.  CT of 05/04/2016. FINDINGS: Cardiovascular: Mild motion degradation throughout. Aortic and branch vessel atherosclerosis. Tortuous thoracic aorta. Moderate cardiomegaly, without pericardial effusion. Multivessel coronary artery atherosclerosis. Pulmonary artery enlargement, 3.3 cm outflow tract. No central pulmonary embolism, on this non-dedicated study. Mediastinum/Nodes: No supraclavicular adenopathy. Nonspecific right-sided thyroid nodules. Progressive thoracic adenopathy. Example precarinal node at 2.7 cm on image 50/series 3. Direct tumor extension in the right-side of the mediastinum, including on image 57/series 3. Right hilar involvement by tumor and/or adenopathy, including on image 68/series 3. Developing adenopathy within the azygoesophageal recess. This measures 1.7 cm on image 78/series 3. Left infrahilar adenopathy at 1.5 cm on image 76/series 3. Lungs/Pleura: Small right-sided pleural effusion  is slightly increased. Trace left pleural fluid is similar. Right upper lobe endobronchial compression or obstruction. Marked worsening of right upper lobe aeration with partial opacification by masslike soft tissue density without air bronchograms. Surrounding areas of nodular airspace opacity, septal thickening and ground-glass throughout the remainder of the right upper lobe. Dependent right lower lobe atelectasis. Similar right lower lobe nodularity including at 7 mm on image 87/series 7. A right apical 5 mm nodule is similar on image 21/series 7. Upper Abdomen: Development of heterogeneous density throughout the liver, most consistent with widespread metastatic disease. most apparent on series 8. Normal  imaged portions of the spleen, stomach, pancreas, adrenal glands. Increased number and size of upper abdominal retroperitoneal nodes. Example retrocaval node of 1.3 cm on image 56/series 8 versus 1.0 cm on the prior. Apparent collateral vein entering the IVC in the right-sided abdomen on image 55/series 8, similar and of indeterminate etiology. Incompletely imaged abdominal wall laxity and hernia containing nonobstructive bowel. Musculoskeletal: No acute osseous abnormality. IMPRESSION: 1. 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 2. Progressive masslike opacity throughout the right upper lobe with direct extension to the right hilum on right side of the mediastinum. 3. New or progressive thoracic nodal metastasis. 4. Development of extensive hepatic and upper abdominal nodal metastasis. 5. Mild motion degradation. 6. Other areas of right upper lobe ground-glass and pulmonary opacity could represent postobstructive pneumonitis and/or lymphangitic tumor. 7. Increase in small right and similar trace left pleural effusion. 8. Incompletely imaged collateral vein about the right-side of the abdomen is nonspecific. This could be related to chronic IVC thrombus or even an incompletely imaged right renal mass. 9. Pulmonary artery enlargement suggests pulmonary arterial hypertension. 10. Coronary artery atherosclerosis. Aortic Atherosclerosis (ICD10-I70.0). These results will be called to the ordering clinician or representative by the Radiologist Assistant, and communication documented in the PACS or zVision Dashboard. Electronically Signed   By: Abigail Miyamoto M.D.   On: 09/06/2016 13:50   Dg Chest Port 1 View  Result Date: 09/07/2016 CLINICAL DATA:  Hypoxia EXAM: PORTABLE CHEST 1 VIEW COMPARISON:  Chest CT 09/06/2016 Chest radiograph 09/05/2016 FINDINGS: There is continued consolidation of the lower portion of the right upper lobe with prominence of the right hilum  consistent with hilar mass. There is a small right pleural effusion. Mild airspace opacities in the right lower lobe. Left lung is clear. No pneumothorax. IMPRESSION: Partial right upper lobe consolidation, likely postobstructive in the setting of right hilar mass. The findings have slightly worsened from the prior chest radiograph of 09/05/2016. Small right pleural effusion. Electronically Signed   By: Ulyses Jarred M.D.   On: 09/07/2016 06:10    ASSESSMENT: This is a very pleasant 76 years old white male with highly suspicious metastatic lung cancer presented with large right upper lobe lung mass with consolidation as well as progressive mediastinal lymphadenopathy and widely spread liver disease. The patient has no tissue diagnosis.  PLAN: I had a lengthy discussion with the patient today about his condition and treatment options. The patient was so sleepy today and I am not sure if this is his baseline performance status but his nurse mentioned that he was much better yesterday. His sputum cytology is negative for malignancy so far. I personally and independently reviewed the scan images.  I would recommend for the patient to have ultrasound-guided right thoracentesis for therapeutic and diagnostic purposes. If the pleural fluid is negative for malignancy, the patient may benefit from ultrasound-guided core biopsy  of one of the liver lesion. He will need further staging workup including a PET scan as well as MRI of the brain and these can be performed an outpatient basis after discharge. If no improvement in his general condition and the patient continues to have poor performance status, I would strongly recommend for him palliative care and hospice. The patient voices understanding of current disease status and treatment options and is in agreement with the current care plan. All questions were answered. The patient knows to call the clinic with any problems, questions or concerns. We can  certainly see the patient much sooner if necessary.  Thank you so much for allowing me to participate in the care of [employer]. I will continue to follow up the patient with you and assist in his care.  Disclaimer: This note was dictated with voice recognition software. Similar sounding words can inadvertently be transcribed and may not be corrected upon review.   Olamae Ferrara K. September 09, 2016, 9:02 AM

## 2016-09-09 NOTE — Progress Notes (Signed)
Pharmacy Antibiotic Note  Steven Krause is a 76 y.o. male admitted on 09/04/2016 with aspiration pneumonia. Today is day 4 of zosyn for possible aspiration pneumonia. Renal function is good. Patient is also being seen by oncology for possible pulmonary malignancy. Clinically stable with normal white count. All cultures are negative so far.   Plan: Zosyn 3.375g IV q8h Monitor renal function, clinical status, ability to de-escalate  Height: 5\' 9"  (175.3 cm) Weight: 215 lb 6.4 oz (97.7 kg) IBW/kg (Calculated) : 70.7  Temp (24hrs), Avg:98 F (36.7 C), Min:97.5 F (36.4 C), Max:98.5 F (36.9 C)   Recent Labs Lab 09/04/16 1059  09/04/16 1819 09/04/16 2158 09/05/16 0055 09/05/16 0354 09/05/16 0652 09/07/16 0305 09/09/16 0335  WBC 7.9  --   --   --   --  7.9  --  8.6  --   CREATININE 0.96  --   --   --   --  0.92  --  0.93 0.95  LATICACIDVEN  --   < > 3.8* 2.5* 1.9 2.5* 1.7  --   --   < > = values in this interval not displayed.  Estimated Creatinine Clearance: 77.4 mL/min (by C-G formula based on SCr of 0.95 mg/dL).    Allergies  Allergen Reactions  . Statins Nausea And Vomiting   Antimicrobials this admission:  7/1 CTX >> 7/3 7/1 Azithro >> 7/3  Dose adjustments this admission:  Zosyn q6h > q8h EI  Microbiology results:  7/1 BCx: NGTD 7/2 UCx:  Insig growth 7/2 Sputum:  Normal respiratory flora Legionella UAg: Neg S. Pneumo UAg: Neg  Thank you for allowing pharmacy to be a part of this patient's care.  Dierdre Harness, [place], PharmD Clinical Pharmacy Resident [phone] (Pager) 09/09/2016 12:27 PM

## 2016-09-09 NOTE — Clinical Social Work Note (Signed)
Pt agreeable to SNF and chose [facility]. CSW communicated with Gerald Stabs at [facility] who confirmed bed offer. CSW faxed clinicals to [facility]. Awaiting insurance auth. Per Julaine Fusi will not accept an LOG for [facility]. CSW spoke with Lattie Haw at [facility] to confirm receipt of fax. Insurance auth not yet received. [employer] Medicare closed on weekend, per [place]. CSW will continue to follow.   Oretha Ellis, [place], [place] Work [phone]

## 2016-09-09 NOTE — Progress Notes (Signed)
Patient sitting at the edge of the bed at times. Denies any pain.O2 at 4L.  No apparent distress noted. Will continue to monitor.

## 2016-09-09 NOTE — Evaluation (Signed)
Occupational Therapy Evaluation Patient Details Name: Steven Krause MRN: [id number] DOB: 1940/10/03 Today's Date: 09/09/2016    History of Present Illness This 76 y.o. maled admitted with ARF with hypoxia secondary to COPD, obstructive PNA/RUL mass with hilar and mediastinal adenopathy concerning for bronchogenic CA - work up continues.   PMH includes:  Diastolic HF   Clinical Impression   Pt admitted with above. He demonstrates the below listed deficits and will benefit from continued OT to maximize safety and independence with BADLs.  Pt presents to OT with generalized weakness, decreased activity awareness, decreased safety awareness and impaired judgement (may be baseline).  He currently requires min guard - mod A for ADLs and fatigues rapidly.  DOE 3/4-4/4 with sats 81% on 4L supplemental 02 and requires frequent rest breaks.  02 improved to mid 90s on 6L.  Pt insistent he can discharge home with neighbor assisting.  He is at very high risk for decompensation, falls, and readmission.  Long discussion re: safety implications of discharge to home - he voices he might consider SNF.        Follow Up Recommendations  SNF;Supervision/Assistance - 24 hour    Equipment Recommendations  3 in 1 bedside commode;Tub/shower bench    Recommendations for Other Services       Precautions / Restrictions Precautions Precautions: Fall Restrictions Weight Bearing Restrictions: No      Mobility Bed Mobility Overal bed mobility: Modified Independent             General bed mobility comments: pt sitting EOB upon arrival  Transfers Overall transfer level: Needs assistance Equipment used: Rolling walker (2 wheeled) Transfers: Sit to/from [facility] Sit to Stand: Min guard Stand pivot transfers: Min guard       General transfer comment: min guard for safety; cues for hand placement    Balance Overall balance assessment: Needs assistance Sitting-balance support: No  upper extremity supported Sitting balance-Leahy Scale: Good     Standing balance support: No upper extremity supported;During functional activity Standing balance-Leahy Scale: Fair                             ADL either performed or assessed with clinical judgement   ADL Overall ADL's : Needs assistance/impaired Eating/Feeding: Independent   Grooming: Wash/dry hands;Wash/dry face;Oral care;Brushing hair;Min guard;Standing   Upper Body Bathing: Set up;Supervision/ safety;Sitting;Standing;Min guard   Lower Body Bathing: Minimal assistance;Sit to/from stand Lower Body Bathing Details (indicate cue type and reason): attempted to simulate shower in standing, but pt fatigues and is unable to complete.  However, he states that once the water hits him, and he's home, he'll be able to do it  Upper Body Dressing : Supervision/safety;Set up;Sitting   Lower Body Dressing: Moderate assistance;Sit to/from stand Lower Body Dressing Details (indicate cue type and reason): Pt unable to doff Lt sock, which he attributes to his hernia.  He is unable to problem solve why he is struggling here in hospital despite being able to do it at home independently  Toilet Transfer: Min guard;Ambulation;Comfort height toilet;Grab bars;RW   Toileting- Water quality scientist and Hygiene: Min guard;Sit to/from stand       Functional mobility during ADLs: Min guard;Rolling walker General ADL Comments: Pt fatigues quickly with ADLs activity with DOE 3/4 - 4/4 and sats decreasing to 81% on 4L supplemental 02.  He requires several sitting rest breaks      Vision   Additional Comments: Pt reports he  has "blurred" vision at baseline      Perception     Praxis      Pertinent Vitals/Pain Pain Assessment: No/denies pain     Hand Dominance Right   Extremity/Trunk Assessment Upper Extremity Assessment Upper Extremity Assessment: Generalized weakness   Lower Extremity Assessment Lower Extremity  Assessment: Defer to PT evaluation   Cervical / Trunk Assessment Cervical / Trunk Assessment: Kyphotic   Communication Communication Communication: No difficulties   Cognition Arousal/Alertness: Awake/alert Behavior During Therapy: WFL for tasks assessed/performed Overall Cognitive Status: No family/caregiver present to determine baseline cognitive functioning                                 General Comments: Pt does not seem to fully grasp diagnosis and implications.  He requires cues for complex reasoning, and has impaired judgement.  Anticipate this may be his baseline, but no family is present to determine baseline    General Comments  Pt insistent that he can go home with his neighbor providing intermittent assist.  Long discussion with pt re: risks of discharge home, risk of fall, risk of malnutrition due to being to fatigued to perform meal prep and eat.   He demonstrates poor awareness of deficits, but with much discussion seems to grasp that home discharge may not be safe option     Exercises     Shoulder Instructions      Home Living Family/patient expects to be discharged to:: Private residence Living Arrangements: Alone Available Help at Discharge: Neighbor;Available PRN/intermittently Type of Home: Apartment Home Access: Level entry     Home Layout: One level     Bathroom Shower/Tub: Teacher, early years/pre: Standard     Home Equipment: None          Prior Functioning/Environment Level of Independence: Independent        Comments: Pt reports he drives, but that he doesn't have a license because his vision "is blurry".   Pt denies falls         OT Problem List: Decreased strength;Decreased activity tolerance;Impaired balance (sitting and/or standing);Decreased safety awareness;Decreased knowledge of use of DME or AE;Cardiopulmonary status limiting activity      OT Treatment/Interventions: Self-care/ADL training;Therapeutic  exercise;Neuromuscular education;Energy conservation;DME and/or AE instruction;Therapeutic activities;Patient/family education;Balance training    OT Goals(Current goals can be found in the care plan section) Acute Rehab OT Goals Patient Stated Goal: to go back home  OT Goal Formulation: With patient Time For Goal Achievement: 09/23/16 Potential to Achieve Goals: Good ADL Goals Pt Will Perform Grooming: with supervision;standing Pt Will Perform Lower Body Bathing: with supervision;with adaptive equipment;sit to/from stand Pt Will Perform Lower Body Dressing: with supervision;with adaptive equipment;sit to/from stand Pt Will Transfer to Toilet: with supervision;ambulating;regular height toilet;bedside commode;grab bars Pt Will Perform Toileting - Clothing Manipulation and hygiene: with supervision;sit to/from stand Additional ADL Goal #1: Pt will incorporate energy conservation techniques into ADL tasks with min cues  Additional ADL Goal #2: Pt will actively participate in 20 mins ADL activities with DOE no >3/4 and no more than 3 rest breaks.   OT Frequency: Min 2X/week   Barriers to D/C: Decreased caregiver support          Co-evaluation              AM-PAC PT "6 Clicks" Daily Activity     Outcome Measure Help from another person eating meals?: None Help from  another person taking care of personal grooming?: A Little Help from another person toileting, which includes using toliet, bedpan, or urinal?: A Little Help from another person bathing (including washing, rinsing, drying)?: A Little Help from another person to put on and taking off regular upper body clothing?: A Little Help from another person to put on and taking off regular lower body clothing?: A Lot 6 Click Score: 18   End of Session Equipment Utilized During Treatment: Rolling walker;Oxygen Nurse Communication: Mobility status  Activity Tolerance: Patient limited by fatigue Patient left: in bed;with call  bell/phone within reach  OT Visit Diagnosis: Unsteadiness on feet (R26.81)                Time: 4103-0131 OT Time Calculation (min): 38 min Charges:  OT General Charges $OT Visit: 1 Procedure OT Evaluation $OT Eval Moderate Complexity: 1 Procedure OT Treatments $Self Care/Home Management : 8-22 mins $Therapeutic Activity: 8-22 mins G-Codes:     [facility], OTR/L [phone]   [place], Payton Moder M 09/09/2016, 2:42 PM

## 2016-09-10 ENCOUNTER — Encounter (HOSPITAL_COMMUNITY): Payer: Self-pay | Admitting: Internal Medicine

## 2016-09-10 ENCOUNTER — Inpatient Hospital Stay (HOSPITAL_COMMUNITY): Payer: Medicare Other

## 2016-09-10 DIAGNOSIS — I6300 Cerebral infarction due to thrombosis of unspecified precerebral artery: Secondary | ICD-10-CM

## 2016-09-10 DIAGNOSIS — C801 Malignant (primary) neoplasm, unspecified: Secondary | ICD-10-CM

## 2016-09-10 DIAGNOSIS — R899 Unspecified abnormal finding in specimens from other organs, systems and tissues: Secondary | ICD-10-CM

## 2016-09-10 LAB — BASIC METABOLIC PANEL
ANION GAP: 8 (ref 5–15)
BUN: 17 mg/dL (ref 6–20)
CO2: 31 mmol/L (ref 22–32)
Calcium: 8.6 mg/dL — ABNORMAL LOW (ref 8.9–10.3)
Chloride: 92 mmol/L — ABNORMAL LOW (ref 101–111)
Creatinine, Ser: 0.89 mg/dL (ref 0.61–1.24)
GFR calc Af Amer: >60 mL/min (ref >60)
GLUCOSE: 115 mg/dL — AB (ref 65–99)
POTASSIUM: 3.4 mmol/L — AB (ref 3.5–5.1)
Sodium: 131 mmol/L — ABNORMAL LOW (ref 135–145)

## 2016-09-10 LAB — BLOOD GAS, ARTERIAL
ACID-BASE EXCESS: 6.4 mmol/L — AB (ref 0.0–2.0)
Bicarbonate: 30.9 mmol/L — ABNORMAL HIGH (ref 20.0–28.0)
DRAWN BY: 283381
O2 Content: 4 L/min
O2 SAT: 93.7 %
PH ART: 7.429 (ref 7.350–7.450)
Patient temperature: 97.9
pCO2 arterial: 47.2 mmHg (ref 32.0–48.0)
pO2, Arterial: 66.5 mmHg — ABNORMAL LOW (ref 83.0–108.0)

## 2016-09-10 MED ORDER — GADOBENATE DIMEGLUMINE 529 MG/ML IV SOLN
20.0000 mL | Freq: Once | INTRAVENOUS | Status: AC | PRN
  Administered 2016-09-10: 20 mL via INTRAVENOUS

## 2016-09-10 MED ORDER — FUROSEMIDE 10 MG/ML IJ SOLN
40.0000 mg | Freq: Once | INTRAMUSCULAR | Status: AC
  Administered 2016-09-10: 40 mg via INTRAVENOUS
  Filled 2016-09-10: qty 4

## 2016-09-10 MED ORDER — FUROSEMIDE 10 MG/ML IJ SOLN
40.0000 mg | Freq: Every day | INTRAMUSCULAR | Status: DC
  Administered 2016-09-10 – 2016-09-12 (×3): 40 mg via INTRAVENOUS
  Filled 2016-09-10 (×3): qty 4

## 2016-09-10 MED ORDER — POTASSIUM CHLORIDE CRYS ER 20 MEQ PO TBCR
40.0000 meq | EXTENDED_RELEASE_TABLET | Freq: Two times a day (BID) | ORAL | Status: AC
  Administered 2016-09-10 (×2): 40 meq via ORAL
  Filled 2016-09-10 (×3): qty 2

## 2016-09-10 NOTE — Plan of Care (Signed)
Problem: Skin Integrity: Goal: Risk for impaired skin integrity will decrease Outcome: Not Progressing Moist and red at Skin folds

## 2016-09-10 NOTE — Progress Notes (Signed)
Patient seemed to be short of breath, checked oxygen= 83%, gave breathing treatment, then oxygen= 93%.

## 2016-09-10 NOTE — Progress Notes (Signed)
PROGRESS NOTE    Steven Krause  TMH:[id number] DOB: [birth year] DOA: 09/04/2016 PCP: Gayland Curry, DO    Brief Narrative: 76 year old gentleman with prior h/o hypertension, tobacco abuse, presents with worsening SOB and recurrent pneumonia and RUL mass in the lung.    Assessment & Plan:   Active Problems:   Acquired polycythemia   Tobacco use disorder   Hypopotassemia   Obesity, unspecified   Chronic atrial fibrillation (HCC)   Ventricular bigeminy   Hyperglycemia   Overweight (BMI 25.0-29.9)   Essential hypertension   Hyperlipidemia   Anemia   CAP (community acquired pneumonia)   Pneumonia   Acute respiratory failure with hypoxia ([place])   Mass of right lung   Diastolic heart failure ([place])   Hyponatremia   Palliative care by specialist   DNR (do not resuscitate) discussion   DNR (do not resuscitate)   Dyspnea   Acute respiratory failure with hypoxia:  Secondary to copd/ obstructive pneumonia/ RUL mass with hilar and mediastinal adenopathy 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  On IV zosyn to complete the course.  Was scheduled to see PCCM as outpatient but he couldn't keep the appt and got re admitted.  PCCM consulted to see if he needs a bronch for evaluation of malignancy. Unfortunately, the LN are not accessible by bronchoscopy.  Blood cultures are negative.  Sputum cultures show normal flora.  Cytology from sputum from 7/3 is negative, repeat sputum cytology ordered. Oncology consulted and patient underwent thoracentesis and 1.2 lit of serous fluid taken and cytology sent for analysis.  Meanwhile MRi brain ordered to evaluate for mets. It showed 6 mm subacute infarct versus solitary metastasis in the right putamen. MRI in 6 weeks should be able to differentiate. Discussed the results with the patient and the family at bedside, his nephew.    Hypertension:  Controlled.    Mild normocytic anemia:  Hemoglobin stable around  10  Hypokalemia: repleted.     Mild acute on Chronic diastolic heart failure: he has pedal edema, small pleral effusion, requiring about 4 lit of Jet Oxygen , working with PT. Started on IV lasix 40 mg daily . Last echo from 05/2016 shows grade 2 diastolic dysfunction.  Will get strict intake and output.  bnp 290.    Hyponatremia: possibly from fluid overload.   Chronic atrial fibrillation; rate controlled.  Resume eliquis.    In view of his social condition and multiple co morbidities palliative care consulted and recommendations in chart.    DVT prophylaxis: eliquis.  Code Status: DNR Family Communication: none at bedside. Discussed the plan of care with the patient.  Disposition Plan: to SNF on Monday.    Consultants:  Oncology PCCM.    Procedures: none.    Antimicrobials:zosyn since admission.    Subjective: No chest pain or sob.  No nausea, vomiting.  No headache.  No tingling or numbness.  Reports some trouble with vision.   Objective: Vitals:   09/10/16 0404 09/10/16 0755 09/10/16 1025 09/10/16 1300  BP: 131/74  (!) 144/85 (!) 150/66  Pulse: 81 80  76  Resp: 18   18  Temp: (!) 97.4 F (36.3 C)   97.9 F (36.6 C)  TempSrc: Oral   Oral  SpO2: 96% 93% 94% 100%  Weight:      Height:        Intake/Output Summary (Last 24 hours) at 09/10/16 1528 Last data filed at 09/10/16 1413  Gross per 24 hour  Intake  1110 ml  Output              850 ml  Net              260 ml   Filed Weights   09/08/16 0534 09/09/16 0344 09/10/16 0244  Weight: 96.2 kg (212 lb 1.6 oz) 97.7 kg (215 lb 6.4 oz) 96.8 kg (213 lb 6.4 oz)    Examination:  General exam: Appears  Calm and comfortable, On 4 lit of  oxygen.  Respiratory system:  Basilar rales. No rhonchi.  Cardiovascular system: S1 & S2 heard, RRR. 2+ pedal edema.  Gastrointestinal system: Abdomen is nondistended, soft and nontender. No organomegaly or masses felt. Normal bowel sounds heard. Central  nervous system: Alert and oriented. No focal neurological deficits. Extremities: 2+ pedal edema.  Skin: No rashes, lesions or ulcers Psychiatry: . Mood & affect appropriate.     Data Reviewed: I have personally reviewed following labs and imaging studies  CBC:  Recent Labs Lab 09/04/16 1059 09/05/16 0354 09/07/16 0305  WBC 7.9 7.9 8.6  NEUTROABS 6.7  --   --   HGB 11.5* 10.4* 10.8*  HCT 37.5* 34.6* 37.2*  MCV 81.0 81.8 83.6  PLT 263 259 025   Basic Metabolic Panel:  Recent Labs Lab 09/04/16 1059 09/05/16 0354 09/06/16 0341 09/07/16 0305 09/09/16 0335 09/10/16 0351  NA 133* 134*  --  135 132* 131*  K 3.3* 3.2*  --  3.7 3.7 3.4*  CL 96* 98*  --  98* 92* 92*  CO2 26 30  --  31 34* 31  GLUCOSE 117* 139*  --  121* 146* 115*  BUN 13 12  --  12 17 17   CREATININE 0.96 0.92  --  0.93 0.95 0.89  CALCIUM 8.9 8.1*  --  8.5* 8.7* 8.6*  MG  --   --  1.8  --   --   --    GFR: Estimated Creatinine Clearance: 82.3 mL/min (by C-G formula based on SCr of 0.89 mg/dL). Liver Function Tests:  Recent Labs Lab 09/05/16 0354 09/07/16 0305  AST 134* 168*  ALT 41 48  ALKPHOS 228* 290*  BILITOT 0.7 0.8  PROT 6.8 7.0  ALBUMIN 2.7* 2.7*   No results for input(s): LIPASE, AMYLASE in the last 168 hours. No results for input(s): AMMONIA in the last 168 hours. Coagulation Profile: No results for input(s): INR, PROTIME in the last 168 hours. Cardiac Enzymes: No results for input(s): CKTOTAL, CKMB, CKMBINDEX, TROPONINI in the last 168 hours. BNP (last 3 results) No results for input(s): PROBNP in the last 8760 hours. HbA1C: No results for input(s): HGBA1C in the last 72 hours. CBG: No results for input(s): GLUCAP in the last 168 hours. Lipid Profile: No results for input(s): CHOL, HDL, LDLCALC, TRIG, CHOLHDL, LDLDIRECT in the last 72 hours. Thyroid Function Tests: No results for input(s): TSH, T4TOTAL, FREET4, T3FREE, THYROIDAB in the last 72 hours. Anemia Panel: No results for  input(s): VITAMINB12, FOLATE, FERRITIN, TIBC, IRON, RETICCTPCT in the last 72 hours. Sepsis Labs:  Recent Labs Lab 09/04/16 2158 09/05/16 0055 09/05/16 0354 09/05/16 8527  LATICACIDVEN 2.5* 1.9 2.5* 1.7    Recent Results (from the past 240 hour(s))  Blood Culture (routine x 2)     Status: None   Collection Time: 09/04/16 11:33 AM  Result Value Ref Range Status   Specimen Description BLOOD LEFT FOREARM  Final   Special Requests   Final    BOTTLES DRAWN AEROBIC AND  ANAEROBIC Blood Culture adequate volume   Culture NO GROWTH 5 DAYS  Final   Report Status 09/09/2016 FINAL  Final  Blood Culture (routine x 2)     Status: None   Collection Time: 09/04/16 11:35 AM  Result Value Ref Range Status   Specimen Description BLOOD LEFT ANTECUBITAL  Final   Special Requests   Final    BOTTLES DRAWN AEROBIC AND ANAEROBIC Blood Culture adequate volume   Culture NO GROWTH 5 DAYS  Final   Report Status 09/09/2016 FINAL  Final  Urine culture     Status: Abnormal   Collection Time: 09/04/16 12:40 PM  Result Value Ref Range Status   Specimen Description URINE, RANDOM  Final   Special Requests NONE  Final   Culture <10,000 COLONIES/mL INSIGNIFICANT GROWTH (A)  Final   Report Status 09/05/2016 FINAL  Final  Culture, sputum-assessment     Status: None   Collection Time: 09/04/16  7:55 PM  Result Value Ref Range Status   Specimen Description SPUTUM  Final   Special Requests NONE  Final   Sputum evaluation THIS SPECIMEN IS ACCEPTABLE FOR SPUTUM CULTURE  Final   Report Status 09/05/2016 FINAL  Final  Culture, respiratory (NON-Expectorated)     Status: None   Collection Time: 09/04/16  7:55 PM  Result Value Ref Range Status   Specimen Description SPUTUM  Final   Special Requests NONE Reflexed from [id number]  Final   Gram Stain   Final    RARE WBC PRESENT, PREDOMINANTLY PMN FEW GRAM POSITIVE COCCI IN PAIRS FEW GRAM VARIABLE ROD FEW GRAM NEGATIVE RODS    Culture Consistent with normal respiratory  flora.  Final   Report Status 09/07/2016 FINAL  Final         Radiology Studies: Dg Chest 1 View  Result Date: 09/09/2016 CLINICAL DATA:  S/p thoracentesis 1.1 L removed from right side EXAM: CHEST  1 VIEW COMPARISON:  09/07/2016 FINDINGS: No pneumothorax. No definite residual effusion. Left lung clear. Improved aeration of the right lung base. Right mid lung and hilar mass as before. Heart size upper limits normal for technique. Visualized bones unremarkable. IMPRESSION: 1. No pneumothorax post right thoracentesis. Electronically Signed   By: Lucrezia Europe M.D.   On: 09/09/2016 15:17   Mr Jeri Cos UV Contrast  Result Date: 09/10/2016 CLINICAL DATA:  Suspected bronchogenic carcinoma.  Staging. EXAM: MRI HEAD WITHOUT AND WITH CONTRAST TECHNIQUE: Multiplanar, multiecho pulse sequences of the brain and surrounding structures were obtained without and with intravenous contrast. CONTRAST:  20 cc MultiHance intravenous COMPARISON:  None. FINDINGS: Brain: 6 mm focus of restricted diffusion and enhancement in the right putamen. No additional patent enhancement is noted. There is a background of chronic microvascular ischemia in the cerebral white matter with remote lacunar infarct in the right thalamus. No hemorrhage, hydrocephalus, or mass effect. Remote hemorrhagic focus in the left cerebellum without associated enhancement. Vascular: Major flow voids are preserved. Skull and upper cervical spine: Symmetric hypointense marrow in the cervical spine, likely related to patient's history of polycythemia. Dorsal clivus marrow is heterogeneous, without definite focal masslike area. This finding will be re- evaluated at short follow-up. Sinuses/Orbits: Negative Other: Moderately motion degraded study, which could obscure pathologic findings. IMPRESSION: 1. 6 mm subacute infarct versus solitary metastasis in the right putamen. MRI in 6 weeks should be able to differentiate. 2. Moderately motion degraded study. 3.  Heterogeneous marrow in the clivus, attention on follow-up. Electronically Signed   By: Neva Seat.D.  On: 09/10/2016 10:49   Ir Thoracentesis Asp Pleural Space W/img Guide  Result Date: 09/09/2016 INDICATION: Right lung mass with right pleural effusion. Request is made for diagnostic and therapeutic thoracentesis. EXAM: ULTRASOUND GUIDED DIAGNOSTIC AND THERAPEUTIC THORACENTESIS MEDICATIONS: 1% lidocaine COMPLICATIONS: None immediate. PROCEDURE: An ultrasound guided thoracentesis was thoroughly discussed with the patient and questions answered. The benefits, risks, alternatives and complications were also discussed. The patient understands and wishes to proceed with the procedure. Written consent was obtained. Ultrasound was performed to localize and mark an adequate pocket of fluid in the right chest. The area was then prepped and draped in the normal sterile fashion. 1% Lidocaine was used for local anesthesia. Under ultrasound guidance a Safe-T-Centesis catheter was introduced. Thoracentesis was performed. The catheter was removed and a dressing applied. FINDINGS: A total of approximately 1.1 L of serous fluid was removed. Samples were sent to the laboratory as requested by the clinical team. IMPRESSION: Successful ultrasound guided right thoracentesis yielding 1.1 L of pleural fluid. Read by: Saverio Danker, PA-C Electronically Signed   By: Corrie Mckusick D.O.   On: 09/09/2016 15:27        Scheduled Meds: . amLODipine  10 mg Oral Daily  . apixaban  5 mg Oral BID  . ezetimibe  10 mg Oral Daily  . furosemide  40 mg Intravenous Daily  . iron polysaccharides  150 mg Oral Daily  . mouth rinse  15 mL Mouth Rinse BID  . pantoprazole  40 mg Oral Q0600  . potassium chloride  40 mEq Oral BID  . tamsulosin  0.4 mg Oral QPM   Continuous Infusions: . piperacillin-tazobactam (ZOSYN)  IV 3.375 g (09/10/16 1526)     LOS: 5 days    Time spent: 35 minutes.     Hosie Poisson, MD [facility] Pager [phone] If 7PM-7AM, please contact night-coverage [email or web address] Password [email or web address] 09/10/2016, 3:28 PM

## 2016-09-10 NOTE — Progress Notes (Signed)
Informed day shift nurse about patient not being on diuretic.

## 2016-09-10 NOTE — Consult Note (Signed)
NEURO HOSPITALIST CONSULT NOTE    Reason for Consult: abnormal MRI brain, stroke vs mets  HPI:                                                                                                                                          Steven Krause is an 76 y.o. male, R handed, with a PMH significant for HTN, HLD, diastolic HF, a fib on apixaban, admitted with ARF with hypoxia secondary to COPD, obstructive PNA/RUL mass with hilar and mediastinal adenopathy concerning for bronchogenic CA (no tissue diagnosis yet, considered high risk for broncho). Neurology consulted in order to render opinion about MRI brain that disclosed a 6 mm focus of restricted diffusion and enhancement in the right putamen suggestive of subacute infarct vs solitary metastasis. Patient is very drowsy at this moment, but denies HA, vertigo, double vision, focal weakness, slurred speech, language or vision impairment.   Past Medical History:  Diagnosis Date  . Abdominal pain, generalized   . Acquired polycythemia 01/05/2011  . Allergy   . Diarrhea   . Dizziness and giddiness   . Hernia of unspecified site of abdominal cavity without mention of obstruction or gangrene   . Hypertension   . Hypopotassemia   . Iron excess 01/05/2011  . 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 Memory loss   . Other specified cardiac dysrhythmias(427.89)   . 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 Polycythemia, secondary   . Systemic hypertension   . Tobacco use disorder   . Unspecified disorder of kidney and ureter   . Unspecified disorder of skin and subcutaneous tissue   . Unspecified hearing loss     Past Surgical History:  Procedure Laterality Date  . CHOLECYSTECTOMY    . COLONOSCOPY N/A 05/10/2016   Procedure: COLONOSCOPY;  Surgeon: Manus Gunning, MD;  Location: [facility];  Service: Gastroenterology;  Laterality: N/A;  . ESOPHAGOGASTRODUODENOSCOPY N/A 05/10/2016   Procedure: ESOPHAGOGASTRODUODENOSCOPY (EGD);  Surgeon: Manus Gunning, MD;  Location: [facility] [facility];  Service: Gastroenterology;  Laterality: N/A;  . GIVENS CAPSULE STUDY N/A 05/10/2016   Procedure: GIVENS CAPSULE STUDY;  Surgeon: Manus Gunning, MD;  Location: [facility];  Service: Gastroenterology;  Laterality: N/A;  . INFLAMED COLON    . IR THORACENTESIS ASP PLEURAL SPACE W/IMG GUIDE  09/09/2016  . LEFT HEART CATH AND CORONARY ANGIOGRAPHY N/A 05/06/2016   Procedure: Left Heart Cath and Coronary Angiography;  Surgeon: Peter M Martinique, MD;  Location: [facility];  Service: Cardiovascular;  Laterality: N/A;  . NM MYOCAR PERF WALL MOTION  8//30/11   normal  . US ECHOCARDIOGRAPHY  11/03/09   EF 50-55%,  . VENTRAL HERNIA REPAIR      Family History  Problem Relation Age of Onset  . Colon polyps Mother 32  .  Colon cancer Mother   . Heart disease Father   . Diabetes Sister   . Cancer Brother   . Diabetes Sister   . Diabetes Sister   . Heart disease Sister   . Stomach cancer Neg Hx   . Rectal cancer Neg Hx     Family History: unable to obtain  Social History:  reports that he quit smoking about 4 months ago. His smoking use included Pipe. He has a 13.75 pack-year smoking history. He has never used smokeless tobacco. He reports that he does not drink alcohol or use drugs.  Allergies  Allergen Reactions  . Statins Nausea And Vomiting    MEDICATIONS:                                                                                                                     I have reviewed the patient's current medications.   ROS:  Unable to obtain due to mental status.                                                                                                                                     History obtained from unobtainable from patient due to mental status   Physical exam:  Constitutional: well developed, pleasant male in no apparent distress. Eyes: no jaundice or exophthalmos.  Head: normocephalic. Neck: supple, no  bruits, no JVD. Cardiac: no murmurs. Lungs: clear. Abdomen: soft, no tender, no mass. Extremities: no edema, clubbing, or cyanosis.  Skin: no rash  Physical exam: Blood pressure 130/81, pulse 69, temperature 98 F (36.7 C), temperature source Oral, resp. rate 18, height 5' 9"  (1.753 m), weight 96.8 kg (213 lb 6.4 oz), SpO2 99 %.  Constitutional: pleasant male, looks chronically ill, drowsy, in no apparent distress. Eyes: no jaundice or exophthalmos.  Head: normocephalic. Neck: supple, no bruits, no JVD. Cardiac: no murmurs. Lungs: some crackles. Abdomen: soft, no tender, no mass. Extremities: bilateral LE edema, no clubbing, or cyanosis.  Skin: no rash Neurologic Examination:  Mental Status: Mildly lethargic but open eyes and is oriented  X 4. Speech fluent without evidence of aphasia. Requires continuous stimulation but is able to follow 2nd step commands. Cranial Nerves: II:  Visual fields grossly normal, pupils equal, round, reactive to light and accommodation III,IV, VI: ptosis not present, extra-ocular motions intact bilaterally V,VII: smile symmetric, facial light touch sensation normal bilaterally VIII: hearing normal bilaterally IX,X: uvula rises symmetrically XI: bilateral shoulder shrug XII: midline tongue extension without atrophy or fasciculations  Motor: Right : Upper extremity   5/5    Left:     Upper extremity   5/5  Lower extremity   5/5     Lower extremity   5/5 Tone and bulk:normal tone throughout; no atrophy noted Sensory: Pinprick and light touch intact throughout, bilaterally Deep Tendon Reflexes:  Right: Upper Extremity   Left: Upper extremity   biceps (C-5 to C-6) 2/4   biceps (C-5 to C-6) 2/4 tricep (C7) 2/4    triceps (C7) 2/4 Brachioradialis (C6) 2/4  Brachioradialis (C6) 2/4  Lower Extremity Lower Extremity  quadriceps (L-2 to L-4) 2/4    quadriceps (L-2 to L-4) 2/4 Achilles (S1) 2/4   Achilles (S1) 2/4  Plantars: Right: downgoing   Left: downgoing Cerebellar: normal finger-to-nose, heel-to-shin was not tested Gait: No tested due to clinical status.    Lab Results  Component Value Date/Time   CHOL 155 03/04/2015 08:29 AM    Results for orders placed or performed during the hospital encounter of 09/04/16 (from the past 48 hour(s))  Basic metabolic panel     Status: Abnormal   Collection Time: 09/09/16  3:35 AM  Result Value Ref Range   Sodium 132 (L) 135 - 145 mmol/L   Potassium 3.7 3.5 - 5.1 mmol/L   Chloride 92 (L) 101 - 111 mmol/L   CO2 34 (H) 22 - 32 mmol/L   Glucose, Bld 146 (H) 65 - 99 mg/dL   BUN 17 6 - 20 mg/dL   Creatinine, Ser 0.95 0.61 - 1.24 mg/dL   Calcium 8.7 (L) 8.9 - 10.3 mg/dL   GFR calc non Af Amer >60 >60 mL/min   GFR calc Af Amer >60 >60 mL/min    Comment: (NOTE) The eGFR has been calculated using the CKD EPI equation. This calculation has not been validated in all clinical situations. eGFR's persistently <60 mL/min signify possible Chronic Kidney Disease.    Anion gap 6 5 - 15  Basic metabolic panel     Status: Abnormal   Collection Time: 09/10/16  3:51 AM  Result Value Ref Range   Sodium 131 (L) 135 - 145 mmol/L   Potassium 3.4 (L) 3.5 - 5.1 mmol/L   Chloride 92 (L) 101 - 111 mmol/L   CO2 31 22 - 32 mmol/L   Glucose, Bld 115 (H) 65 - 99 mg/dL   BUN 17 6 - 20 mg/dL   Creatinine, Ser 0.89 0.61 - 1.24 mg/dL   Calcium 8.6 (L) 8.9 - 10.3 mg/dL   GFR calc non Af Amer >60 >60 mL/min   GFR calc Af Amer >60 >60 mL/min    Comment: (NOTE) The eGFR has been calculated using the CKD EPI equation. This calculation has not been validated in all clinical situations. eGFR's persistently <60 mL/min signify possible Chronic Kidney Disease.    Anion gap 8 5 - 15  Blood gas, arterial     Status: Abnormal   Collection Time: 09/10/16  5:40 PM  Result Value Ref Range  O2 Content 4.0 L/min    Delivery systems NASAL CANNULA    pH, Arterial 7.429 7.350 - 7.450   pCO2 arterial 47.2 32.0 - 48.0 mmHg   pO2, Arterial 66.5 (L) 83.0 - 108.0 mmHg   Bicarbonate 30.9 (H) 20.0 - 28.0 mmol/L   Acid-Base Excess 6.4 (H) 0.0 - 2.0 mmol/L   O2 Saturation 93.7 %   Patient temperature 97.9    Collection site RIGHT RADIAL    Drawn by [id number]    Sample type ARTERIAL DRAW    Allens test (pass/fail) PASS PASS    Dg Chest 1 View  Result Date: 09/09/2016 CLINICAL DATA:  S/p thoracentesis 1.1 L removed from right side EXAM: CHEST  1 VIEW COMPARISON:  09/07/2016 FINDINGS: No pneumothorax. No definite residual effusion. Left lung clear. Improved aeration of the right lung base. Right mid lung and hilar mass as before. Heart size upper limits normal for technique. Visualized bones unremarkable. IMPRESSION: 1. No pneumothorax post right thoracentesis. Electronically Signed   By: Lucrezia Europe M.D.   On: 09/09/2016 15:17   Mr Jeri Cos XB Contrast  Result Date: 09/10/2016 CLINICAL DATA:  Suspected bronchogenic carcinoma.  Staging. EXAM: MRI HEAD WITHOUT AND WITH CONTRAST TECHNIQUE: Multiplanar, multiecho pulse sequences of the brain and surrounding structures were obtained without and with intravenous contrast. CONTRAST:  20 cc MultiHance intravenous COMPARISON:  None. FINDINGS: Brain: 6 mm focus of restricted diffusion and enhancement in the right putamen. No additional patent enhancement is noted. There is a background of chronic microvascular ischemia in the cerebral white matter with remote lacunar infarct in the right thalamus. No hemorrhage, hydrocephalus, or mass effect. Remote hemorrhagic focus in the left cerebellum without associated enhancement. Vascular: Major flow voids are preserved. Skull and upper cervical spine: Symmetric hypointense marrow in the cervical spine, likely related to patient's history of polycythemia. Dorsal clivus marrow is heterogeneous, without definite focal masslike area. This finding  will be re- evaluated at short follow-up. Sinuses/Orbits: Negative Other: Moderately motion degraded study, which could obscure pathologic findings. IMPRESSION: 1. 6 mm subacute infarct versus solitary metastasis in the right putamen. MRI in 6 weeks should be able to differentiate. 2. Moderately motion degraded study. 3. Heterogeneous marrow in the clivus, attention on follow-up. Electronically Signed   By: Monte Fantasia M.D.   On: 09/10/2016 10:49   Ir Thoracentesis Asp Pleural Space W/img Guide  Result Date: 09/09/2016 INDICATION: Right lung mass with right pleural effusion. Request is made for diagnostic and therapeutic thoracentesis. EXAM: ULTRASOUND GUIDED DIAGNOSTIC AND THERAPEUTIC THORACENTESIS MEDICATIONS: 1% lidocaine COMPLICATIONS: None immediate. PROCEDURE: An ultrasound guided thoracentesis was thoroughly discussed with the patient and questions answered. The benefits, risks, alternatives and complications were also discussed. The patient understands and wishes to proceed with the procedure. Written consent was obtained. Ultrasound was performed to localize and mark an adequate pocket of fluid in the right chest. The area was then prepped and draped in the normal sterile fashion. 1% Lidocaine was used for local anesthesia. Under ultrasound guidance a Safe-T-Centesis catheter was introduced. Thoracentesis was performed. The catheter was removed and a dressing applied. FINDINGS: A total of approximately 1.1 L of serous fluid was removed. Samples were sent to the laboratory as requested by the clinical team. IMPRESSION: Successful ultrasound guided right thoracentesis yielding 1.1 L of pleural fluid. Read by: Saverio Danker, PA-C Electronically Signed   By: Corrie Mckusick D.O.   On: 09/09/2016 15:27   Assessment/Plan: 76 y.o. male, R handed, with a PMH significant  for HTN, HLD, diastolic HF, a fib on apixaban, admitted with ARF with hypoxia secondary to COPD, obstructive PNA/RUL mass with hilar and  mediastinal adenopathy concerning for bronchogenic CA (no tissue diagnosis yet, considered high risk for broncho).  MRI brain that disclosed a 6 mm focus of restricted diffusion and enhancement in the right putamen suggestive of subacute infarct vs solitary metastasis. Patient appears to have no neurological symptoms or signs referable to such lesion at this time. His apixaban was correctly resumed. Concur with repeating MRI brain in 4-6 weeks to better define the small R putaminal lesion. If this is a subacute infarct, then it is due to small vessel disease and thus pursuing comprehensive stroke work up in this particular clinical scenario will no change management. Do not recommend adding antiplatelet therapy due to increase risk of bleeding with concomitant use of apixaban. Please, call neurology with any questions or concerns.     Dorian Pod, MD 09/10/2016, 8:45 PM

## 2016-09-10 NOTE — Progress Notes (Signed)
Pt is alert and oriented with periods of drowsiness and forgetful, hypoxic paged MD-ordered lasix 40 mg IV once external Cath applied.

## 2016-09-10 NOTE — Progress Notes (Signed)
Patient is constantly sitting up throughout the night. Nurse asked if he is breathing ok and patient states he is breathing fine, he just isn't sleeping. Will keep observing and making sure patient is stable.

## 2016-09-10 NOTE — Progress Notes (Signed)
MD ordered Lasix  IV 40 mg daily. Patient went to MRI Brain.  w/ or w/o contrast at this time.

## 2016-09-10 NOTE — Progress Notes (Signed)
Patient having shortness of breath 93 % O2 ar 4L  MD notified.

## 2016-09-10 NOTE — Progress Notes (Signed)
Patient is drowsy, confused at times and  trying to remove his oxygen . Patient having  jerky  movements  Intermittently. Denies any pain .Paged MD and immediately responded. O2  93% at 4L. New orders noted.

## 2016-09-10 NOTE — Progress Notes (Signed)
Forgetful of condom cath removed and used urinal with soiled gown and top sheets changed with urinal and call bell with in reach.

## 2016-09-11 DIAGNOSIS — E785 Hyperlipidemia, unspecified: Secondary | ICD-10-CM

## 2016-09-11 DIAGNOSIS — Z9889 Other specified postprocedural states: Secondary | ICD-10-CM

## 2016-09-11 LAB — BASIC METABOLIC PANEL
Anion gap: 8 (ref 5–15)
BUN: 14 mg/dL (ref 6–20)
CALCIUM: 8.5 mg/dL — AB (ref 8.9–10.3)
CHLORIDE: 94 mmol/L — AB (ref 101–111)
CO2: 31 mmol/L (ref 22–32)
CREATININE: 0.86 mg/dL (ref 0.61–1.24)
GFR calc non Af Amer: >60 mL/min (ref >60)
GLUCOSE: 101 mg/dL — AB (ref 65–99)
Potassium: 4 mmol/L (ref 3.5–5.1)
Sodium: 133 mmol/L — ABNORMAL LOW (ref 135–145)

## 2016-09-11 NOTE — Plan of Care (Signed)
Problem: Pain Managment: Goal: General experience of comfort will improve Outcome: Progressing Denies pain  Problem: Skin Integrity: Goal: Risk for impaired skin integrity will decrease Outcome: Not Progressing Scatted bruises to both arms

## 2016-09-11 NOTE — Progress Notes (Signed)
PROGRESS NOTE    Steven Krause  MBW:[id number] DOB: November 02, 1940 DOA: 09/04/2016 PCP: Gayland Curry, DO    Brief Narrative: 76 year old gentleman with prior h/o hypertension, tobacco abuse, chronic atrial fibrillation on eliquis, chronic diastolic heart failure, right upper lobe mass diagnosed in February 2018 ( failed to follow up ), hyperlipidemia, anemia,  presents with worsening SOB and recurrent pneumonia and RUL mass in the lung.    Assessment & Plan:   Active Problems:   Acquired polycythemia   Tobacco use disorder   Hypopotassemia   Obesity, unspecified   Chronic atrial fibrillation (HCC)   Ventricular bigeminy   Hyperglycemia   Overweight (BMI 25.0-29.9)   Essential hypertension   Hyperlipidemia   Anemia   CAP (community acquired pneumonia)   Pneumonia   Acute respiratory failure with hypoxia ([place])   Mass of right lung   Diastolic heart failure ([place])   Hyponatremia   Palliative care by specialist   DNR (do not resuscitate) discussion   DNR (do not resuscitate)   Dyspnea   Abnormal pleural fluid   Malignancy ([place])   Acute respiratory failure with hypoxia:  Secondary to copd exacerbation/ obstructive pneumonia/ RUL mass with hilar and mediastinal adenopathy 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  Received 8 days of IV zosyn.  Was scheduled to see PCCM as outpatient but he couldn't keep the appt and got re admitted.  PCCM consulted to see if he needs a bronch for evaluation of malignancy. Unfortunately, the LN are not accessible by bronchoscopy. Recommended sputum cytology and outpatient follow up with pulmonology.  Blood cultures are negative.  Sputum cultures show normal flora.  Cytology from sputum from 7/3 and 7/6 are  negative,. Oncology consulted and patient underwent thoracentesis and 1.2 lit of serous fluid taken and cytology sent for analysis. His CEA came back elevated at 91.  Meanwhile MRi brain ordered to evaluate for mets. It  showed 6 mm subacute infarct versus solitary metastasis in the right putamen. MRI in 6 weeks should be able to differentiate. Requested neurology to see if any further work up is needed. At this time no change in management.  Discussed the results with the patient and the family at bedside, his nephew.    Hypertension:  Controlled.    Mild normocytic anemia:  Hemoglobin stable around 10  Hypokalemia: repleted.     Mild acute on Chronic diastolic heart failure: he has pedal edema, small pleral effusion, requiring about 4 lit of [place] Oxygen , working with PT. Started on IV lasix 40 mg daily . Last echo from 05/2016 shows grade 2 diastolic dysfunction.  Will get strict intake and output.  bnp 290.  Hyperlipidemia: resume zetia.   GERD:  Resume protonix.     Hyponatremia: possibly from fluid overload. Improving.    Chronic atrial fibrillation; rate controlled.  Resume eliquis.    In view of his social condition and multiple co morbidities palliative care consulted and recommendations in chart.    DVT prophylaxis: eliquis.  Code Status: DNR Family Communication: none at bedside. Discussed the plan of care with the patient.  Disposition Plan: to SNF on Monday.    Consultants:  Oncology PCCM.  Neurology.    Procedures:   Echocardiogram   Thoracentesis    Antimicrobials:zosyn since admission. From 7/1   Subjective: Slightly confused,  No chest pain or sob.   Objective: Vitals:   09/10/16 1300 09/10/16 1619 09/10/16 1935 09/11/16 0349  BP: (!) 150/66  130/81 133/77  Pulse: 76  69 68  Resp: 18  18 18   Temp: 97.9 F (36.6 C)  98 F (36.7 C) 98.2 F (36.8 C)  TempSrc: Oral  Oral Oral  SpO2: 100% 93% 99% 99%  Weight:    96.1 kg (211 lb 13.8 oz)  Height:        Intake/Output Summary (Last 24 hours) at 09/11/16 0807 Last data filed at 09/11/16 0600  Gross per 24 hour  Intake              650 ml  Output              900 ml  Net             -250 ml    Filed Weights   09/09/16 0344 09/10/16 0244 09/11/16 0349  Weight: 97.7 kg (215 lb 6.4 oz) 96.8 kg (213 lb 6.4 oz) 96.1 kg (211 lb 13.8 oz)    Examination:  General exam: Appears  Calm and comfortable, On 4 lit of  oxygen.  Respiratory system:  Basilar rales. No rhonchi. No wheezing heard.  Cardiovascular system: S1 & S2 heard, RRR. 2+ pedal edema.  Gastrointestinal system: Abdomen is nondistended, soft and nontender. No organomegaly or masses felt. Normal bowel sounds heard. Central nervous system: Alert and oriented. Non focal.  Extremities: 2+ pedal edema.  Skin: No rashes, lesions or ulcers Psychiatry: . Mood & affect appropriate.     Data Reviewed: I have personally reviewed following labs and imaging studies  CBC:  Recent Labs Lab 09/04/16 1059 09/05/16 0354 09/07/16 0305  WBC 7.9 7.9 8.6  NEUTROABS 6.7  --   --   HGB 11.5* 10.4* 10.8*  HCT 37.5* 34.6* 37.2*  MCV 81.0 81.8 83.6  PLT 263 259 491   Basic Metabolic Panel:  Recent Labs Lab 09/05/16 0354 09/06/16 0341 09/07/16 0305 09/09/16 0335 09/10/16 0351 09/11/16 0234  NA 134*  --  135 132* 131* 133*  K 3.2*  --  3.7 3.7 3.4* 4.0  CL 98*  --  98* 92* 92* 94*  CO2 30  --  31 34* 31 31  GLUCOSE 139*  --  121* 146* 115* 101*  BUN 12  --  12 17 17 14   CREATININE 0.92  --  0.93 0.95 0.89 0.86  CALCIUM 8.1*  --  8.5* 8.7* 8.6* 8.5*  MG  --  1.8  --   --   --   --    GFR: Estimated Creatinine Clearance: 84.9 mL/min (by C-G formula based on SCr of 0.86 mg/dL). Liver Function Tests:  Recent Labs Lab 09/05/16 0354 09/07/16 0305  AST 134* 168*  ALT 41 48  ALKPHOS 228* 290*  BILITOT 0.7 0.8  PROT 6.8 7.0  ALBUMIN 2.7* 2.7*   No results for input(s): LIPASE, AMYLASE in the last 168 hours. No results for input(s): AMMONIA in the last 168 hours. Coagulation Profile: No results for input(s): INR, PROTIME in the last 168 hours. Cardiac Enzymes: No results for input(s): CKTOTAL, CKMB, CKMBINDEX,  TROPONINI in the last 168 hours. BNP (last 3 results) No results for input(s): PROBNP in the last 8760 hours. HbA1C: No results for input(s): HGBA1C in the last 72 hours. CBG: No results for input(s): GLUCAP in the last 168 hours. Lipid Profile: No results for input(s): CHOL, HDL, LDLCALC, TRIG, CHOLHDL, LDLDIRECT in the last 72 hours. Thyroid Function Tests: No results for input(s): TSH, T4TOTAL, FREET4, T3FREE, THYROIDAB in the last 72 hours. Anemia Panel:  No results for input(s): VITAMINB12, FOLATE, FERRITIN, TIBC, IRON, RETICCTPCT in the last 72 hours. Sepsis Labs:  Recent Labs Lab 09/04/16 2158 09/05/16 0055 09/05/16 0354 09/05/16 0652  LATICACIDVEN 2.5* 1.9 2.5* 1.7    Recent Results (from the past 240 hour(s))  Blood Culture (routine x 2)     Status: None   Collection Time: 09/04/16 11:33 AM  Result Value Ref Range Status   Specimen Description BLOOD LEFT FOREARM  Final   Special Requests   Final    BOTTLES DRAWN AEROBIC AND ANAEROBIC Blood Culture adequate volume   Culture NO GROWTH 5 DAYS  Final   Report Status 09/09/2016 FINAL  Final  Blood Culture (routine x 2)     Status: None   Collection Time: 09/04/16 11:35 AM  Result Value Ref Range Status   Specimen Description BLOOD LEFT ANTECUBITAL  Final   Special Requests   Final    BOTTLES DRAWN AEROBIC AND ANAEROBIC Blood Culture adequate volume   Culture NO GROWTH 5 DAYS  Final   Report Status 09/09/2016 FINAL  Final  Urine culture     Status: Abnormal   Collection Time: 09/04/16 12:40 PM  Result Value Ref Range Status   Specimen Description URINE, RANDOM  Final   Special Requests NONE  Final   Culture <10,000 COLONIES/mL INSIGNIFICANT GROWTH (A)  Final   Report Status 09/05/2016 FINAL  Final  Culture, sputum-assessment     Status: None   Collection Time: 09/04/16  7:55 PM  Result Value Ref Range Status   Specimen Description SPUTUM  Final   Special Requests NONE  Final   Sputum evaluation THIS SPECIMEN IS  ACCEPTABLE FOR SPUTUM CULTURE  Final   Report Status 09/05/2016 FINAL  Final  Culture, respiratory (NON-Expectorated)     Status: None   Collection Time: 09/04/16  7:55 PM  Result Value Ref Range Status   Specimen Description SPUTUM  Final   Special Requests NONE Reflexed from [id number]  Final   Gram Stain   Final    RARE WBC PRESENT, PREDOMINANTLY PMN FEW GRAM POSITIVE COCCI IN PAIRS FEW GRAM VARIABLE ROD FEW GRAM NEGATIVE RODS    Culture Consistent with normal respiratory flora.  Final   Report Status 09/07/2016 FINAL  Final         Radiology Studies: Dg Chest 1 View  Result Date: 09/09/2016 CLINICAL DATA:  S/p thoracentesis 1.1 L removed from right side EXAM: CHEST  1 VIEW COMPARISON:  09/07/2016 FINDINGS: No pneumothorax. No definite residual effusion. Left lung clear. Improved aeration of the right lung base. Right mid lung and hilar mass as before. Heart size upper limits normal for technique. Visualized bones unremarkable. IMPRESSION: 1. No pneumothorax post right thoracentesis. Electronically Signed   By: Lucrezia Europe M.D.   On: 09/09/2016 15:17   Mr Jeri Cos YI Contrast  Result Date: 09/10/2016 CLINICAL DATA:  Suspected bronchogenic carcinoma.  Staging. EXAM: MRI HEAD WITHOUT AND WITH CONTRAST TECHNIQUE: Multiplanar, multiecho pulse sequences of the brain and surrounding structures were obtained without and with intravenous contrast. CONTRAST:  20 cc MultiHance intravenous COMPARISON:  None. FINDINGS: Brain: 6 mm focus of restricted diffusion and enhancement in the right putamen. No additional patent enhancement is noted. There is a background of chronic microvascular ischemia in the cerebral white matter with remote lacunar infarct in the right thalamus. No hemorrhage, hydrocephalus, or mass effect. Remote hemorrhagic focus in the left cerebellum without associated enhancement. Vascular: Major flow voids are preserved. Skull and upper  cervical spine: Symmetric hypointense marrow in the  cervical spine, likely related to patient's history of polycythemia. Dorsal clivus marrow is heterogeneous, without definite focal masslike area. This finding will be re- evaluated at short follow-up. Sinuses/Orbits: Negative Other: Moderately motion degraded study, which could obscure pathologic findings. IMPRESSION: 1. 6 mm subacute infarct versus solitary metastasis in the right putamen. MRI in 6 weeks should be able to differentiate. 2. Moderately motion degraded study. 3. Heterogeneous marrow in the clivus, attention on follow-up. Electronically Signed   By: Monte Fantasia M.D.   On: 09/10/2016 10:49   Ir Thoracentesis Asp Pleural Space W/img Guide  Result Date: 09/09/2016 INDICATION: Right lung mass with right pleural effusion. Request is made for diagnostic and therapeutic thoracentesis. EXAM: ULTRASOUND GUIDED DIAGNOSTIC AND THERAPEUTIC THORACENTESIS MEDICATIONS: 1% lidocaine COMPLICATIONS: None immediate. PROCEDURE: An ultrasound guided thoracentesis was thoroughly discussed with the patient and questions answered. The benefits, risks, alternatives and complications were also discussed. The patient understands and wishes to proceed with the procedure. Written consent was obtained. Ultrasound was performed to localize and mark an adequate pocket of fluid in the right chest. The area was then prepped and draped in the normal sterile fashion. 1% Lidocaine was used for local anesthesia. Under ultrasound guidance a Safe-T-Centesis catheter was introduced. Thoracentesis was performed. The catheter was removed and a dressing applied. FINDINGS: A total of approximately 1.1 L of serous fluid was removed. Samples were sent to the laboratory as requested by the clinical team. IMPRESSION: Successful ultrasound guided right thoracentesis yielding 1.1 L of pleural fluid. Read by: Saverio Danker, PA-C Electronically Signed   By: Corrie Mckusick D.O.   On: 09/09/2016 15:27        Scheduled Meds: . amLODipine  10  mg Oral Daily  . apixaban  5 mg Oral BID  . ezetimibe  10 mg Oral Daily  . furosemide  40 mg Intravenous Daily  . iron polysaccharides  150 mg Oral Daily  . mouth rinse  15 mL Mouth Rinse BID  . pantoprazole  40 mg Oral Q0600  . tamsulosin  0.4 mg Oral QPM   Continuous Infusions: . piperacillin-tazobactam (ZOSYN)  IV 3.375 g (09/11/16 0621)     LOS: 6 days    Time spent: 35 minutes.     Hosie Poisson, MD [facility] Pager [phone] If 7PM-7AM, please contact night-coverage [email or web address] Password [email or web address] 09/11/2016, 8:07 AM

## 2016-09-11 NOTE — Progress Notes (Signed)
Patient alert but with periods of confusion. Patient wheezing and congested. Albuterol 3 ml, lasix 40 mg  and Mucinex  600 mg given. MD notified. Assisted to the [facility]. Condom cath in place. Bed in the lowest position.d bed alarm activated and functioning. Patient resting quietly at this time. Will continue to monitor.

## 2016-09-12 LAB — BASIC METABOLIC PANEL
Anion gap: 9 (ref 5–15)
BUN: 16 mg/dL (ref 6–20)
CHLORIDE: 94 mmol/L — AB (ref 101–111)
CO2: 33 mmol/L — AB (ref 22–32)
CREATININE: 0.81 mg/dL (ref 0.61–1.24)
Calcium: 8.7 mg/dL — ABNORMAL LOW (ref 8.9–10.3)
GFR calc non Af Amer: >60 mL/min (ref >60)
GLUCOSE: 97 mg/dL (ref 65–99)
Potassium: 3.8 mmol/L (ref 3.5–5.1)
Sodium: 136 mmol/L (ref 135–145)

## 2016-09-12 MED ORDER — IPRATROPIUM-ALBUTEROL 0.5-2.5 (3) MG/3ML IN SOLN: 3.0000 mL | RESPIRATORY_TRACT | 5 refills | Status: DC | PRN

## 2016-09-12 MED ORDER — GUAIFENESIN ER 600 MG PO TB12: 600.0000 mg | ORAL_TABLET | Freq: Two times a day (BID) | ORAL | Status: AC | PRN

## 2016-09-12 MED ORDER — HYDROCODONE-ACETAMINOPHEN 5-325 MG PO TABS: 1.0000 | ORAL_TABLET | Freq: Four times a day (QID) | ORAL | 0 refills | Status: AC | PRN

## 2016-09-12 NOTE — Progress Notes (Signed)
Call placed to CCMD to notify of telemetry monitoring d/c. Pt is c/a/o at baseline. He denies complaints.  His belongings are at bedside, paperwork has been prepared and is ready for discharge.

## 2016-09-12 NOTE — Clinical Social Work Note (Signed)
CSW facilitated patient discharge including contacting patient family and facility to confirm patient discharge plans. Clinical information faxed to facility and family agreeable with plan. CSW arranged ambulance transport via [facility] to [facility]. RN to call report prior to discharge [phone]).  CSW will sign off for now as social work intervention is no longer needed. Please consult [place] again if new needs arise.  Dayton Scrape, Esmeralda

## 2016-09-12 NOTE — Progress Notes (Signed)
Report called to [facility], to [employer].

## 2016-09-12 NOTE — Progress Notes (Signed)
Patient is dressed, IV removed. Paperwork ready for [facility]. Awaiting arrival of transport.

## 2016-09-12 NOTE — Clinical Social Work Placement (Signed)
   CLINICAL SOCIAL WORK PLACEMENT  NOTE  Date:  09/12/2016  Patient Details  Name: Steven Krause MRN: [id number] Date of Birth: [birth year]  Clinical Social Work is seeking post-discharge placement for this patient at the [place] level of care (*CSW will initial, date and re-position this form in  chart as items are completed):  Yes   Patient/family provided with [place] Work Department's list of facilities offering this level of care within the geographic area requested by the patient (or if unable, by the patient's family).  Yes   Patient/family informed of their freedom to choose among providers that offer the needed level of care, that participate in Medicare, Medicaid or managed care program needed by the patient, have an available bed and are willing to accept the patient.  Yes   Patient/family informed of [facility]'s ownership interest in [facility] and [facility], as well as of the fact that they are under no obligation to receive care at these facilities.  PASRR submitted to EDS on 09/08/16     PASRR number received on 09/08/16     Existing PASRR number confirmed on       FL2 transmitted to all facilities in geographic area requested by pt/family on 09/08/16     FL2 transmitted to all facilities within larger geographic area on       Patient informed that his/her managed care company has contracts with or will negotiate with certain facilities, including the following:        Yes   Patient/family informed of bed offers received.  Patient chooses bed at [place]     Physician recommends and patient chooses bed at      Patient to be transferred to [facility] on 09/12/16.  Patient to be transferred to facility by [facility]     Patient family notified on 09/12/16 of transfer.  Name of family member notified:  Zadie Rhine (Sister): [phone]     PHYSICIAN Please prepare  prescriptions     Additional Comment:    _______________________________________________ Candie Chroman, LCSW 09/12/2016, 4:26 PM

## 2016-09-12 NOTE — Discharge Summary (Signed)
Physician Discharge Summary  Steven Krause RJJ:[id number] DOB: [birth year] DOA: 09/04/2016  PCP: Gayland Curry, DO  Admit date: 09/04/2016 Discharge date: 09/12/2016  Admitted From: [facility].  Disposition:  SNF  Recommendations for Outpatient Follow-up:  1. Follow up with PCP in 1-2 weeks 2. Please obtain BMP/CBC in one week Please follow up with pulmonology and oncology as recommended.  Follow up with palliative care services at the facility. Please follow up the pleural fluid cytology report.   Discharge Condition:STABLE.  CODE STATUS: (FULL) Diet recommendation: REGULAR Brief/Interim Summary: 76 year old gentleman with prior h/o hypertension, tobacco abuse, chronic atrial fibrillation on eliquis, chronic diastolic heart failure, right upper lobe mass diagnosed in February 2018 ( failed to follow up ), hyperlipidemia, anemia,  presents with worsening SOB and recurrent pneumonia and RUL mass in the lung.    Discharge Diagnoses:  Active Problems:   Acquired polycythemia   Tobacco use disorder   Hypopotassemia   Obesity, unspecified   Chronic atrial fibrillation (HCC)   Ventricular bigeminy   Hyperglycemia   Overweight (BMI 25.0-29.9)   Essential hypertension   Hyperlipidemia   Anemia   CAP (community acquired pneumonia)   Pneumonia   Acute respiratory failure with hypoxia ([place])   Mass of right lung   Diastolic heart failure ([place])   Hyponatremia   Palliative care by specialist   DNR (do not resuscitate) discussion   DNR (do not resuscitate)   Dyspnea   Abnormal pleural fluid   Malignancy ([place])  Acute respiratory failure with hypoxia:  Secondary to copd exacerbation/ obstructive pneumonia/ RUL mass with hilar and mediastinal adenopathy 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 Completed the course of zosyn.  Was scheduled to see PCCM as outpatient but he couldn't keep the appt and got re admitted.  PCCM consulted to see if he needs a bronch for evaluation  of malignancy. Unfortunately, the LN are not accessible by bronchoscopy. Recommended sputum cytology and outpatient follow up with pulmonology.  Blood cultures are negative.  Sputum cultures show normal flora.  Cytology from sputum from 7/3 and 7/6 are  negative,. Oncology consulted and patient underwent thoracentesis and 1.2 lit of serous fluid taken and cytology sent for analysis. His CEA came back elevated at 91.  Meanwhile MRi brain ordered to evaluate for mets. It showed 6 mm subacute infarct versus solitary metastasis in the right putamen. MRI in 6 weeks should be able to differentiate. Requested neurology to see if any further work up is needed. At this time no change in management.  Discussed the results with the patient and the family at bedside, his nephew.    Hypertension:  Controlled.    Mild normocytic anemia:  Hemoglobin stable around 10  Hypokalemia: repleted.     Mild acute on Chronic diastolic heart failure: he has pedal edema, small pleral effusion, requiring about 4 lit of [employer] , working with PT. Started on IV lasix 40 mg daily , transitioned to oral lasix on discharge.  Last echo from 05/2016 shows grade 2 diastolic dysfunction.    Hyperlipidemia: resume zetia.   GERD:  Resume protonix.     Hyponatremia: possibly from fluid overload, diuresing with lasix, and repeat level normalized.    Chronic atrial fibrillation; rate controlled.  Resume eliquis.    In view of his social condition and multiple co morbidities palliative care consulted and recommendations in chart.   Discharge Instructions  Discharge Instructions    Diet general    Complete by:  As  directed    Discharge instructions    Complete by:  As directed    Please follow up with Dr Julien Nordmann with oncology and Dr Milinda Hirschfeld with pulmonology as recommended.     Allergies as of 09/12/2016      Reactions   Statins Nausea And Vomiting      Medication List    TAKE these  medications   amLODipine 10 MG tablet Commonly known as:  NORVASC Take 10 mg by mouth daily.   apixaban 5 MG Tabs tablet Commonly known as:  ELIQUIS Take 1 tablet (5 mg total) by mouth 2 (two) times daily.   ezetimibe 10 MG tablet Commonly known as:  ZETIA Take 1 tablet (10 mg total) by mouth daily.   furosemide 40 MG tablet Commonly known as:  LASIX TAKE 1 TABLET IN THE MORNING AND TAKE 1 TABLET IN THE EVENING   guaiFENesin 600 MG 12 hr tablet Commonly known as:  MUCINEX Take 1 tablet (600 mg total) by mouth 2 (two) times daily as needed for cough or to loosen phlegm.   HYDROcodone-acetaminophen 5-325 MG tablet Commonly known as:  NORCO Take 1 tablet by mouth every 6 (six) hours as needed for moderate pain.   ipratropium-albuterol 0.5-2.5 (3) MG/3ML Soln Commonly known as:  DUONEB Take 3 mLs by nebulization every 4 (four) hours as needed.   iron polysaccharides 150 MG capsule Commonly known as:  NIFEREX Take 1 capsule (150 mg total) by mouth daily.   pantoprazole 40 MG tablet Commonly known as:  PROTONIX Take 1 tablet (40 mg total) by mouth daily at 6 (six) AM.   potassium chloride SA 20 MEQ tablet Commonly known as:  K-DUR,KLOR-CON Take 1 tablet (20 mEq total) by mouth daily.   spironolactone 25 MG tablet Commonly known as:  ALDACTONE Take 1 tablet (25 mg total) by mouth daily.   tamsulosin 0.4 MG Caps capsule Commonly known as:  FLOMAX Take 1 capsule (0.4 mg total) by mouth daily.       Contact information for follow-up providers    Javier Glazier, MD Follow up on 09/15/2016.   Specialty:  Pulmonary Disease Why:  Your appointment is at 2:30PM Contact information: [address] [place] [address] [phone]        Curt Bears, MD. Schedule an appointment as soon as possible for a visit in 1 week(s).   Specialty:  Oncology Contact information: [place] [address] [phone]             Contact information for after-discharge care    Destination    [facility] Follow up.   Specialty:  [place] information: [place] [phone]                 Allergies  Allergen Reactions  . Statins Nausea And Vomiting    Consultations: Oncology PCCM.  Neurology.    Procedures/Studies: Dg Chest 1 View  Result Date: 09/09/2016 CLINICAL DATA:  S/p thoracentesis 1.1 L removed from right side EXAM: CHEST  1 VIEW COMPARISON:  09/07/2016 FINDINGS: No pneumothorax. No definite residual effusion. Left lung clear. Improved aeration of the right lung base. Right mid lung and hilar mass as before. Heart size upper limits normal for technique. Visualized bones unremarkable. IMPRESSION: 1. No pneumothorax post right thoracentesis. Electronically Signed   By: Lucrezia Europe M.D.   On: 09/09/2016 15:17   Dg Chest  2 View  Result Date: 09/05/2016 CLINICAL DATA:  Pneumonia with shortness of breath EXAM: CHEST  2 VIEW COMPARISON:  September 04, 2016 chest radiograph and chest CT May 04, 2016 FINDINGS: There is persistent airspace consolidation in the anterior segment right upper lobe with without appreciable change from 1 day prior. There is new focal airspace opacity in the lateral right base with minimal right pleural effusion. Left lung is clear. Heart is upper normal in size with mild pulmonary venous hypertension. No adenopathy evident. No bone lesions. IMPRESSION: Extensive airspace consolidation anterior segment right upper lobe, stable from 1 day prior and consistent with pneumonia. New focal presumed pneumonia lateral right base. New small right pleural effusion. Left lung unchanged. Stable cardiac silhouette with a degree of pulmonary vascular congestion. Followup PA and lateral chest radiographs recommended in 3-4 weeks following trial of antibiotic therapy to ensure resolution and exclude underlying  malignancy. Electronically Signed   By: Lowella Grip III M.D.   On: 09/05/2016 07:49   Dg Chest 2 View  Result Date: 09/04/2016 CLINICAL DATA:  Productive cough and fever. EXAM: CHEST  2 VIEW COMPARISON:  05/04/2016 FINDINGS: Heart size is normal. Parenchymal consolidation is seen in the anterior right upper lobe which is new since prior study. This is suspicious for pneumonia. No evidence of pleural effusion. Mild scarring in right lung base is stable since previous exam. IMPRESSION: New parenchymal consolidation in the anterior right upper lobe, suspicious for pneumonia. Followup PA and lateral chest X-ray is recommended in 3-4 weeks following trial of antibiotic therapy to ensure resolution and exclude underlying malignancy. Electronically Signed   By: Earle Gell M.D.   On: 09/04/2016 10:47   Ct Chest W Contrast  Result Date: 09/06/2016 CLINICAL DATA:  Short of breath on exertion for 6 months. Cough for 3 months. Ex-smoker, quitting in February. EXAM: CT CHEST WITH CONTRAST TECHNIQUE: Multidetector CT imaging of the chest was performed during intravenous contrast administration. CONTRAST:  36mL ISOVUE-300 IOPAMIDOL (ISOVUE-300) INJECTION 61% COMPARISON:  Plain films including 1 day prior.  CT of 05/04/2016. FINDINGS: Cardiovascular: Mild motion degradation throughout. Aortic and branch vessel atherosclerosis. Tortuous thoracic aorta. Moderate cardiomegaly, without pericardial effusion. Multivessel coronary artery atherosclerosis. Pulmonary artery enlargement, 3.3 cm outflow tract. No central pulmonary embolism, on this non-dedicated study. Mediastinum/Nodes: No supraclavicular adenopathy. Nonspecific right-sided thyroid nodules. Progressive thoracic adenopathy. Example precarinal node at 2.7 cm on image 50/series 3. Direct tumor extension in the right-side of the mediastinum, including on image 57/series 3. Right hilar involvement by tumor and/or adenopathy, including on image 68/series 3. Developing  adenopathy within the azygoesophageal recess. This measures 1.7 cm on image 78/series 3. Left infrahilar adenopathy at 1.5 cm on image 76/series 3. Lungs/Pleura: Small right-sided pleural effusion is slightly increased. Trace left pleural fluid is similar. Right upper lobe endobronchial compression or obstruction. Marked worsening of right upper lobe aeration with partial opacification by masslike soft tissue density without air bronchograms. Surrounding areas of nodular airspace opacity, septal thickening and ground-glass throughout the remainder of the right upper lobe. Dependent right lower lobe atelectasis. Similar right lower lobe nodularity including at 7 mm on image 87/series 7. A right apical 5 mm nodule is similar on image 21/series 7. Upper Abdomen: Development of heterogeneous density throughout the liver, most consistent with widespread metastatic disease. most apparent on series 8. Normal imaged portions of the spleen, stomach, pancreas, adrenal glands. Increased number and size of upper abdominal retroperitoneal nodes. Example retrocaval node of 1.3 cm on image 56/series  8 versus 1.0 cm on the prior. Apparent collateral vein entering the IVC in the right-sided abdomen on image 55/series 8, similar and of indeterminate etiology. Incompletely imaged abdominal wall laxity and hernia containing nonobstructive bowel. Musculoskeletal: No acute osseous abnormality. IMPRESSION: 1. 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 2. Progressive masslike opacity throughout the right upper lobe with direct extension to the right hilum on right side of the mediastinum. 3. New or progressive thoracic nodal metastasis. 4. Development of extensive hepatic and upper abdominal nodal metastasis. 5. Mild motion degradation. 6. Other areas of right upper lobe ground-glass and pulmonary opacity could represent postobstructive pneumonitis and/or lymphangitic tumor. 7. Increase in  small right and similar trace left pleural effusion. 8. Incompletely imaged collateral vein about the right-side of the abdomen is nonspecific. This could be related to chronic IVC thrombus or even an incompletely imaged right renal mass. 9. Pulmonary artery enlargement suggests pulmonary arterial hypertension. 10. Coronary artery atherosclerosis. Aortic Atherosclerosis (ICD10-I70.0). These results will be called to the ordering clinician or representative by the Radiologist Assistant, and communication documented in the PACS or zVision Dashboard. Electronically Signed   By: Abigail Miyamoto M.D.   On: 09/06/2016 13:50   Mr Jeri Cos WC Contrast  Result Date: 09/10/2016 CLINICAL DATA:  Suspected bronchogenic carcinoma.  Staging. EXAM: MRI HEAD WITHOUT AND WITH CONTRAST TECHNIQUE: Multiplanar, multiecho pulse sequences of the brain and surrounding structures were obtained without and with intravenous contrast. CONTRAST:  20 cc MultiHance intravenous COMPARISON:  None. FINDINGS: Brain: 6 mm focus of restricted diffusion and enhancement in the right putamen. No additional patent enhancement is noted. There is a background of chronic microvascular ischemia in the cerebral white matter with remote lacunar infarct in the right thalamus. No hemorrhage, hydrocephalus, or mass effect. Remote hemorrhagic focus in the left cerebellum without associated enhancement. Vascular: Major flow voids are preserved. Skull and upper cervical spine: Symmetric hypointense marrow in the cervical spine, likely related to patient's history of polycythemia. Dorsal clivus marrow is heterogeneous, without definite focal masslike area. This finding will be re- evaluated at short follow-up. Sinuses/Orbits: Negative Other: Moderately motion degraded study, which could obscure pathologic findings. IMPRESSION: 1. 6 mm subacute infarct versus solitary metastasis in the right putamen. MRI in 6 weeks should be able to differentiate. 2. Moderately motion  degraded study. 3. Heterogeneous marrow in the clivus, attention on follow-up. Electronically Signed   By: Monte Fantasia M.D.   On: 09/10/2016 10:49   Dg Chest Port 1 View  Result Date: 09/07/2016 CLINICAL DATA:  Hypoxia EXAM: PORTABLE CHEST 1 VIEW COMPARISON:  Chest CT 09/06/2016 Chest radiograph 09/05/2016 FINDINGS: There is continued consolidation of the lower portion of the right upper lobe with prominence of the right hilum consistent with hilar mass. There is a small right pleural effusion. Mild airspace opacities in the right lower lobe. Left lung is clear. No pneumothorax. IMPRESSION: Partial right upper lobe consolidation, likely postobstructive in the setting of right hilar mass. The findings have slightly worsened from the prior chest radiograph of 09/05/2016. Small right pleural effusion. Electronically Signed   By: Ulyses Jarred M.D.   On: 09/07/2016 06:10   Ir Thoracentesis Asp Pleural Space W/img Guide  Result Date: 09/09/2016 INDICATION: Right lung mass with right pleural effusion. Request is made for diagnostic and therapeutic thoracentesis. EXAM: ULTRASOUND GUIDED DIAGNOSTIC AND THERAPEUTIC THORACENTESIS MEDICATIONS: 1% lidocaine COMPLICATIONS: None immediate. PROCEDURE: An ultrasound guided thoracentesis was thoroughly discussed with the patient and questions answered.  The benefits, risks, alternatives and complications were also discussed. The patient understands and wishes to proceed with the procedure. Written consent was obtained. Ultrasound was performed to localize and mark an adequate pocket of fluid in the right chest. The area was then prepped and draped in the normal sterile fashion. 1% Lidocaine was used for local anesthesia. Under ultrasound guidance a Safe-T-Centesis catheter was introduced. Thoracentesis was performed. The catheter was removed and a dressing applied. FINDINGS: A total of approximately 1.1 L of serous fluid was removed. Samples were sent to the laboratory as  requested by the clinical team. IMPRESSION: Successful ultrasound guided right thoracentesis yielding 1.1 L of pleural fluid. Read by: Saverio Danker, PA-C Electronically Signed   By: Corrie Mckusick D.O.   On: 09/09/2016 15:27       Subjective:  No chest pain, sob improved.  Discharge Exam: Vitals:   09/11/16 2053 09/12/16 0422  BP: 138/75 137/73  Pulse: 72 70  Resp: 17 18  Temp: 98.1 F (36.7 C) 97.7 F (36.5 C)   Vitals:   09/11/16 1230 09/11/16 2053 09/12/16 0248 09/12/16 0422  BP: 126/72 138/75  137/73  Pulse: 69 72  70  Resp: 18 17  18   Temp: 98 F (36.7 C) 98.1 F (36.7 C)  97.7 F (36.5 C)  TempSrc: Oral Oral  Oral  SpO2: 95% 91%  94%  Weight:   96 kg (211 lb 11.2 oz)   Height:   5\' 9"  (1.753 m)     General: Pt is alert, awake, not in acute distress Cardiovascular: RRR, S1/S2 +, no rubs, no gallops Respiratory: CTA bilaterally, no wheezing, no rhonchi Abdominal: Soft, NT, ND, bowel sounds + Extremities: no edema, no cyanosis    The results of significant diagnostics from this hospitalization (including imaging, microbiology, ancillary and laboratory) are listed below for reference.     Microbiology: Recent Results (from the past 240 hour(s))  Blood Culture (routine x 2)     Status: None   Collection Time: 09/04/16 11:33 AM  Result Value Ref Range Status   Specimen Description BLOOD LEFT FOREARM  Final   Special Requests   Final    BOTTLES DRAWN AEROBIC AND ANAEROBIC Blood Culture adequate volume   Culture NO GROWTH 5 DAYS  Final   Report Status 09/09/2016 FINAL  Final  Blood Culture (routine x 2)     Status: None   Collection Time: 09/04/16 11:35 AM  Result Value Ref Range Status   Specimen Description BLOOD LEFT ANTECUBITAL  Final   Special Requests   Final    BOTTLES DRAWN AEROBIC AND ANAEROBIC Blood Culture adequate volume   Culture NO GROWTH 5 DAYS  Final   Report Status 09/09/2016 FINAL  Final  Urine culture     Status: Abnormal   Collection  Time: 09/04/16 12:40 PM  Result Value Ref Range Status   Specimen Description URINE, RANDOM  Final   Special Requests NONE  Final   Culture <10,000 COLONIES/mL INSIGNIFICANT GROWTH (A)  Final   Report Status 09/05/2016 FINAL  Final  Culture, sputum-assessment     Status: None   Collection Time: 09/04/16  7:55 PM  Result Value Ref Range Status   Specimen Description SPUTUM  Final   Special Requests NONE  Final   Sputum evaluation THIS SPECIMEN IS ACCEPTABLE FOR SPUTUM CULTURE  Final   Report Status 09/05/2016 FINAL  Final  Culture, respiratory (NON-Expectorated)     Status: None   Collection Time: 09/04/16  7:55  PM  Result Value Ref Range Status   Specimen Description SPUTUM  Final   Special Requests NONE Reflexed from [id number]  Final   Gram Stain   Final    RARE WBC PRESENT, PREDOMINANTLY PMN FEW GRAM POSITIVE COCCI IN PAIRS FEW GRAM VARIABLE ROD FEW GRAM NEGATIVE RODS    Culture Consistent with normal respiratory flora.  Final   Report Status 09/07/2016 FINAL  Final     Labs: BNP (last 3 results)  Recent Labs  05/04/16 1454 05/06/16 0452 09/08/16 1634  BNP 216.0* 251.1* 846.6*   Basic Metabolic Panel:  Recent Labs Lab 09/06/16 0341 09/07/16 0305 09/09/16 0335 09/10/16 0351 09/11/16 0234 09/12/16 0536  NA  --  135 132* 131* 133* 136  K  --  3.7 3.7 3.4* 4.0 3.8  CL  --  98* 92* 92* 94* 94*  CO2  --  31 34* 31 31 33*  GLUCOSE  --  121* 146* 115* 101* 97  BUN  --  12 17 17 14 16   CREATININE  --  0.93 0.95 0.89 0.86 0.81  CALCIUM  --  8.5* 8.7* 8.6* 8.5* 8.7*  MG 1.8  --   --   --   --   --    Liver Function Tests:  Recent Labs Lab 09/07/16 0305  AST 168*  ALT 48  ALKPHOS 290*  BILITOT 0.8  PROT 7.0  ALBUMIN 2.7*   No results for input(s): LIPASE, AMYLASE in the last 168 hours. No results for input(s): AMMONIA in the last 168 hours. CBC:  Recent Labs Lab 09/07/16 0305  WBC 8.6  HGB 10.8*  HCT 37.2*  MCV 83.6  PLT 269   Cardiac Enzymes: No  results for input(s): CKTOTAL, CKMB, CKMBINDEX, TROPONINI in the last 168 hours. BNP: Invalid input(s): POCBNP CBG: No results for input(s): GLUCAP in the last 168 hours. D-Dimer No results for input(s): DDIMER in the last 72 hours. Hgb A1c No results for input(s): HGBA1C in the last 72 hours. Lipid Profile No results for input(s): CHOL, HDL, LDLCALC, TRIG, CHOLHDL, LDLDIRECT in the last 72 hours. Thyroid function studies No results for input(s): TSH, T4TOTAL, T3FREE, THYROIDAB in the last 72 hours.  Invalid input(s): FREET3 Anemia work up No results for input(s): VITAMINB12, FOLATE, FERRITIN, TIBC, IRON, RETICCTPCT in the last 72 hours. Urinalysis    Component Value Date/Time   BILIRUBINUR Neg 12/16/2013 1328   PROTEINUR Trace 12/16/2013 1328   UROBILINOGEN 0.2 12/16/2013 1328   NITRITE Neg 12/16/2013 1328   LEUKOCYTESUR Negative 12/16/2013 1328   Sepsis Labs Invalid input(s): PROCALCITONIN,  WBC,  LACTICIDVEN Microbiology Recent Results (from the past 240 hour(s))  Blood Culture (routine x 2)     Status: None   Collection Time: 09/04/16 11:33 AM  Result Value Ref Range Status   Specimen Description BLOOD LEFT FOREARM  Final   Special Requests   Final    BOTTLES DRAWN AEROBIC AND ANAEROBIC Blood Culture adequate volume   Culture NO GROWTH 5 DAYS  Final   Report Status 09/09/2016 FINAL  Final  Blood Culture (routine x 2)     Status: None   Collection Time: 09/04/16 11:35 AM  Result Value Ref Range Status   Specimen Description BLOOD LEFT ANTECUBITAL  Final   Special Requests   Final    BOTTLES DRAWN AEROBIC AND ANAEROBIC Blood Culture adequate volume   Culture NO GROWTH 5 DAYS  Final   Report Status 09/09/2016 FINAL  Final  Urine culture  Status: Abnormal   Collection Time: 09/04/16 12:40 PM  Result Value Ref Range Status   Specimen Description URINE, RANDOM  Final   Special Requests NONE  Final   Culture <10,000 COLONIES/mL INSIGNIFICANT GROWTH (A)  Final    Report Status 09/05/2016 FINAL  Final  Culture, sputum-assessment     Status: None   Collection Time: 09/04/16  7:55 PM  Result Value Ref Range Status   Specimen Description SPUTUM  Final   Special Requests NONE  Final   Sputum evaluation THIS SPECIMEN IS ACCEPTABLE FOR SPUTUM CULTURE  Final   Report Status 09/05/2016 FINAL  Final  Culture, respiratory (NON-Expectorated)     Status: None   Collection Time: 09/04/16  7:55 PM  Result Value Ref Range Status   Specimen Description SPUTUM  Final   Special Requests NONE Reflexed from [id number]  Final   Gram Stain   Final    RARE WBC PRESENT, PREDOMINANTLY PMN FEW GRAM POSITIVE COCCI IN PAIRS FEW GRAM VARIABLE ROD FEW GRAM NEGATIVE RODS    Culture Consistent with normal respiratory flora.  Final   Report Status 09/07/2016 FINAL  Final     Time coordinating discharge: Over 30 minutes  SIGNED:   Hosie Poisson, MD  [facility] 09/12/2016, 10:13 AM Pager   If 7PM-7AM, please contact night-coverage [email or web address] Password [email or web address]

## 2016-09-12 NOTE — Progress Notes (Signed)
DNR yellow form was without check that indicates expiration. Schorr, NP was paged to get box checked for no expiration and transport.

## 2016-09-12 NOTE — Progress Notes (Addendum)
Spoke to pt's nephew, Harrie Jeans, who provided his mother (Ruth's) number so that we may contact her when he is transferred to [facility].    [phone]

## 2016-09-12 NOTE — Progress Notes (Signed)
Call placed to patient's sister, per family's request, notifying them of pt's transport to [place].

## 2016-09-12 NOTE — Progress Notes (Signed)
Call to [facility] to let them know pt is refusing removal of the condom catheter.

## 2016-09-12 NOTE — Clinical Social Work Note (Addendum)
CSW will need updated PT/OT notes today for insurance authorization.  Dayton Scrape, [place] [phone]  2:09 pm Faxed today's PT note to [facility] Per hospital liaison for [facility], patient has agreed to go to [facility] if he cannot go to [facility]. CSW received voicemail from [facility] that [facility] is not in network with them. The facility is trying to work this out.  Dayton Scrape, [place] [phone]  2:27 pm Patient's authorization for SNF is approved. The insurance company and Althea Charon are trying to figure out if they are definitely out of network or not. I asked that the case worker at [facility] give it until 4:00 before we have to assign it to [facility]. She stated she should definitely have an answer by then and will call CSW back.  Dayton Scrape, [place]  4:28 pm Authorization approved for [facility]: [id number], rug level RVB.  Dayton Scrape, [place]

## 2016-09-12 NOTE — Progress Notes (Signed)
Physical Therapy Treatment Patient Details Name: Steven Krause MRN: [id number] DOB: 04/19/1940 Today's Date: 09/12/2016    History of Present Illness This 76 y.o. maled admitted with ARF with hypoxia secondary to COPD, obstructive PNA/RUL mass with hilar and mediastinal adenopathy concerning for bronchogenic CA - work up continues.   PMH includes:  Diastolic HF    PT Comments    Patient with worsening activity tolerance, increased O2 requirement with desaturation on O2 during activity.  Continue to feel unsafe for d/c home alone and appropriate for SNF level rehab.   Follow Up Recommendations  SNF     Equipment Recommendations  Rolling walker with 5" wheels    Recommendations for Other Services       Precautions / Restrictions Precautions Precautions: Fall Precaution Comments: watch O2 sats    Mobility  Bed Mobility               General bed mobility comments: pt already OOB with OT  Transfers Overall transfer level: Needs assistance Equipment used: Rolling walker (2 wheeled) Transfers: Sit to/from [facility] Sit to Stand: Min assist Stand pivot transfers: Min guard       General transfer comment: cues for safety with hand placement, assist to rise  Ambulation/Gait Ambulation/Gait assistance: Supervision Ambulation Distance (Feet): 15 Feet Assistive device: Rolling walker (2 wheeled) Gait Pattern/deviations: Step-through pattern;Trunk flexed;Decreased stride length;Shuffle     General Gait Details: walked around bed to recliner, limited by fatigue after OT (toileting including hygiene and washing hands at sink)   Science writer    Modified Rankin (Stroke Patients Only)       Balance Overall balance assessment: Needs assistance Sitting-balance support: No upper extremity supported Sitting balance-Leahy Scale: Good     Standing balance support: Single extremity supported Standing balance-Leahy  Scale: Poor Standing balance comment: requires unilateral support                             Cognition Arousal/Alertness: Awake/alert Behavior During Therapy: WFL for tasks assessed/performed Overall Cognitive Status: No family/caregiver present to determine baseline cognitive functioning                                 General Comments: Pt does not seem to fully grasp diagnosis and implications.  He requires cues for complex reasoning, and has impaired judgement.  Anticipate this may be his baseline, but no family is present to determine baseline       Exercises      General Comments General comments (skin integrity, edema, etc.): SpO2 on 6L O2 with standing activity 82%, back up to 90% with seated rest x 3 minutes      Pertinent Vitals/Pain Pain Assessment: No/denies pain    Home Living                      Prior Function            PT Goals (current goals can now be found in the care plan section) Progress towards PT goals: Not progressing toward goals - comment    Frequency    Min 3X/week      PT Plan Current plan remains appropriate    Co-evaluation              AM-PAC PT "6 Clicks"  Daily Activity  Outcome Measure  Difficulty turning over in bed (including adjusting bedclothes, sheets and blankets)?: A Little Difficulty moving from lying on back to sitting on the side of the bed? : A Little Difficulty sitting down on and standing up from a chair with arms (e.g., wheelchair, bedside commode, etc,.)?: Total Help needed moving to and from a bed to chair (including a wheelchair)?: A Little Help needed walking in hospital room?: A Little Help needed climbing 3-5 steps with a railing? : A Little 6 Click Score: 16    End of Session Equipment Utilized During Treatment: Gait belt;Oxygen Activity Tolerance: Patient limited by fatigue Patient left: in chair;with call bell/phone within reach;with chair alarm set   PT Visit  Diagnosis: Unsteadiness on feet (R26.81);Other abnormalities of gait and mobility (R26.89)     Time: 8242-3536 PT Time Calculation (min) (ACUTE ONLY): 18 min  Charges:  $Gait Training: 8-22 mins                    G CodesMagda Krause, [place] [phone] 09/12/2016    Steven Krause 09/12/2016, 1:55 PM

## 2016-09-13 ENCOUNTER — Non-Acute Institutional Stay (SKILLED_NURSING_FACILITY): Payer: Medicare Other | Admitting: Adult Health

## 2016-09-13 ENCOUNTER — Encounter: Payer: Self-pay | Admitting: Adult Health

## 2016-09-13 DIAGNOSIS — R918 Other nonspecific abnormal finding of lung field: Secondary | ICD-10-CM | POA: Diagnosis not present

## 2016-09-13 DIAGNOSIS — D508 Other iron deficiency anemias: Secondary | ICD-10-CM

## 2016-09-13 DIAGNOSIS — E785 Hyperlipidemia, unspecified: Secondary | ICD-10-CM | POA: Diagnosis not present

## 2016-09-13 DIAGNOSIS — J9601 Acute respiratory failure with hypoxia: Secondary | ICD-10-CM | POA: Diagnosis not present

## 2016-09-13 DIAGNOSIS — I481 Persistent atrial fibrillation: Secondary | ICD-10-CM

## 2016-09-13 DIAGNOSIS — I4819 Other persistent atrial fibrillation: Secondary | ICD-10-CM

## 2016-09-13 DIAGNOSIS — D751 Secondary polycythemia: Secondary | ICD-10-CM

## 2016-09-13 DIAGNOSIS — I10 Essential (primary) hypertension: Secondary | ICD-10-CM | POA: Diagnosis not present

## 2016-09-13 DIAGNOSIS — I5032 Chronic diastolic (congestive) heart failure: Secondary | ICD-10-CM | POA: Diagnosis not present

## 2016-09-13 NOTE — Progress Notes (Signed)
Location:   [facility] Room Number: 101 A Place of Service:  SNF (31)   CODE STATUS: DNR  Allergies  Allergen Reactions  . Statins Nausea And Vomiting    Chief Complaint  Patient presents with  . Hospitalization Follow-up    Hospital Follow up    HPI:  He has been hospitalized for pneumonia; acute rspirotary failure with hypoxia; related to COPD; pneumonia and right upper lobe mass with hilar and mediastinal adenopathy. He is here for short ter rehab; he tells me that he does not know if this will be a long ter admission or not. More than likely this does represent a long term placement. There are no nursing concerns at this time.   Past Medical History:  Diagnosis Date  . Abdominal pain, generalized   . Acquired polycythemia 01/05/2011  . Allergy   . Diarrhea   . Dizziness and giddiness   . Hernia of unspecified site of abdominal cavity without mention of obstruction or gangrene   . Hypertension   . Hypopotassemia   . Iron excess 01/05/2011  . 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 Memory loss   . Other specified cardiac dysrhythmias(427.89)   . 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 Polycythemia, secondary   . Systemic hypertension   . Tobacco use disorder   . Unspecified disorder of kidney and ureter   . Unspecified disorder of skin and subcutaneous tissue   . Unspecified hearing loss     Past Surgical History:  Procedure Laterality Date  . CHOLECYSTECTOMY    . COLONOSCOPY N/A 05/10/2016   Procedure: COLONOSCOPY;  Surgeon: Manus Gunning, MD;  Location: [facility];  Service: Gastroenterology;  Laterality: N/A;  . ESOPHAGOGASTRODUODENOSCOPY N/A 05/10/2016   Procedure: ESOPHAGOGASTRODUODENOSCOPY (EGD);  Surgeon: Manus Gunning, MD;  Location: [facility];  Service: Gastroenterology;  Laterality: N/A;  . GIVENS CAPSULE STUDY N/A 05/10/2016   Procedure: GIVENS CAPSULE STUDY;  Surgeon: Manus Gunning, MD;  Location: [facility];  Service: Gastroenterology;   Laterality: N/A;  . INFLAMED COLON    . IR THORACENTESIS ASP PLEURAL SPACE W/IMG GUIDE  09/09/2016  . LEFT HEART CATH AND CORONARY ANGIOGRAPHY N/A 05/06/2016   Procedure: Left Heart Cath and Coronary Angiography;  Surgeon: Peter M Martinique, MD;  Location: [facility];  Service: Cardiovascular;  Laterality: N/A;  . NM MYOCAR PERF WALL MOTION  8//30/11   normal  . US ECHOCARDIOGRAPHY  11/03/09   EF 50-55%,  . VENTRAL HERNIA REPAIR      Social History   Social History  . Marital status: Single    Spouse name: N/A  . Number of children: N/A  . Years of education: N/A   Occupational History  . Not on file.   Social History Main Topics  . Smoking status: Former Smoker    Packs/day: 0.25    Years: 55.00    Types: Pipe    Quit date: 05/01/2016  . Smokeless tobacco: Never Used  . Alcohol use No  . Drug use: No  . Sexual activity: Not Currently   Other Topics Concern  . Not on file   Social History Narrative   Friend [name] is contact and helps with his finances, appts, etc.   Family History  Problem Relation Age of Onset  . Colon polyps Mother 63  . Colon cancer Mother   . Heart disease Father   . Diabetes Sister   . Cancer Brother   . Diabetes Sister   . Diabetes Sister   .  Heart disease Sister   . Stomach cancer Neg Hx   . Rectal cancer Neg Hx       VITAL SIGNS BP 134/77   Pulse 80   Temp 98.4 F (36.9 C)   Resp 20   Ht 5' 9"  (1.753 m)   Wt 212 lb (96.2 kg)   SpO2 96%   BMI 31.31 kg/m    Patient's Medications  New Prescriptions   No medications on file  Previous Medications   AMLODIPINE (NORVASC) 10 MG TABLET    Take 10 mg by mouth daily.   APIXABAN (ELIQUIS) 5 MG TABS TABLET    Take 1 tablet (5 mg total) by mouth 2 (two) times daily.   EZETIMIBE (ZETIA) 10 MG TABLET    Take 1 tablet (10 mg total) by mouth daily.   FUROSEMIDE (LASIX) 40 MG TABLET    TAKE 1 TABLET IN THE MORNING AND TAKE 1 TABLET IN THE EVENING   GUAIFENESIN (MUCINEX) 600 MG 12 HR  TABLET    Take 1 tablet (600 mg total) by mouth 2 (two) times daily as needed for cough or to loosen phlegm.   HYDROCODONE-ACETAMINOPHEN (NORCO) 5-325 MG TABLET    Take 1 tablet by mouth every 6 (six) hours as needed for moderate pain.   IPRATROPIUM-ALBUTEROL (DUONEB) 0.5-2.5 (3) MG/3ML SOLN    Take 3 mLs by nebulization every 4 (four) hours as needed.   IRON POLYSACCHARIDES (NIFEREX) 150 MG CAPSULE    Take 1 capsule (150 mg total) by mouth daily.   OXYGEN    Inhale 4 L/min into the lungs continuous.   PANTOPRAZOLE (PROTONIX) 40 MG TABLET    Take 1 tablet (40 mg total) by mouth daily at 6 (six) AM.   POTASSIUM CHLORIDE SA (K-DUR,KLOR-CON) 20 MEQ TABLET    Take 1 tablet (20 mEq total) by mouth daily.   SPIRONOLACTONE (ALDACTONE) 25 MG TABLET    Take 1 tablet (25 mg total) by mouth daily.   TAMSULOSIN (FLOMAX) 0.4 MG CAPS CAPSULE    Take 1 capsule (0.4 mg total) by mouth daily.  Modified Medications   No medications on file  Discontinued Medications   No medications on file     SIGNIFICANT DIAGNOSTIC EXAMS  09-04-16: chest x-ray: New parenchymal consolidation in the anterior right upper lobe, suspicious for pneumonia. Followup PA and lateral chest X-ray is recommended in 3-4 weeks following trial of antibiotic therapy to ensure resolution and exclude underlying malignancy.  09-06-16: ct of chest: 1. 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 2. Progressive masslike opacity throughout the right upper lobe with direct extension to the right hilum on right side of the mediastinum. 3. New or progressive thoracic nodal metastasis. 4. Development of extensive hepatic and upper abdominal nodal metastasis. 5. Mild motion degradation. 6. Other areas of right upper lobe ground-glass and pulmonary opacity could represent postobstructive pneumonitis and/or lymphangitic tumor. 7. Increase in small right and similar trace left pleural effusion. 8. Incompletely  imaged collateral vein about the right-side of the abdomen is nonspecific. This could be related to chronic IVC thrombus or even an incompletely imaged right renal mass. 9. Pulmonary artery enlargement suggests pulmonary arterial hypertension. 10. Coronary artery atherosclerosis. Aortic Atherosclerosis  09-09-16: thoracentesis: A total of approximately 1.1 L of serous fluid was removed  09-09-16: chest x-ray: . No pneumothorax post right thoracentesis.   09-10-16: mri of brain: 1. 6 mm subacute infarct versus solitary metastasis in the right putamen. MRI in 6  weeks should be able to differentiate. 2. Moderately motion degraded study. 3. Heterogeneous marrow in the clivus, attention on follow-up.  LABS REVIEWED   09-04-16: wbc 79; hgb 11.5; hct 37.5; mcv 81.0; plt 263; glucose 117; bun 13; creat 0.96; k+ 3.3; na++ 133; ca 8.9 blood culture: no growth: urine culture: <10,000 colonies 09-05-16: wb 7.9; hgb 10.4; hct 34.6; mcv 81.8; plt 259; glucose 139; bun 12; creat 0.92; k+ 3.2 ;na++ 134; ca 8.1; ast 134; alk phos 228; albumin 2.7 09-06-16: CEA 91.8; 09-08-16: BNP 290.2 09-12-16: glucose 97;bun 16; creat 0.81; k+ 3.8; na++ 136; ca 8.1   Review of Systems  Constitutional: Negative for malaise/fatigue.  Respiratory: Positive for shortness of breath. Negative for cough.        Wears 02 at 4/L  Cardiovascular: Positive for leg swelling. Negative for chest pain and palpitations.  Gastrointestinal: Negative for abdominal pain, constipation and heartburn.  Musculoskeletal: Negative for back pain, joint pain and myalgias.  Skin: Negative.   Neurological: Negative for dizziness.  Psychiatric/Behavioral: The patient is not nervous/anxious.    Physical Exam  Constitutional: He appears well-developed and well-nourished. No distress.  Obese   Eyes: Conjunctivae are normal.  Neck: Neck supple. No JVD present. No thyromegaly present.  Cardiovascular: Normal rate, normal heart sounds and intact distal pulses.     Heart rate irregular irregular   Respiratory: Effort normal and breath sounds normal. No respiratory distress. He has no wheezes.  GI: Soft. Bowel sounds are normal. He exhibits no distension. There is no tenderness.  Musculoskeletal: He exhibits edema.  Able to move all extremities  Has 2+ lower extremity edema  Lymphadenopathy:    He has no cervical adenopathy.  Neurological: He is alert.  Skin: Skin is warm and dry. He is not diaphoretic.  Psychiatric: He has a normal mood and affect.     ASSESSMENT/ PLAN:  1. Acute on chronic diastolic heart failure: EF 60-65% (05-05-16) will continue lasix 40 mg twice daily with k+ 20 meq daily will continue aldactone 25 mg daily   2. Hypertension: b/p 134/77: will continue norvasc 10 mg daily aldactone 25 mg daily   3. Afib: heart rate stable; will continue eliquis 5 mg twice daily   4. Dyslipidemia: will continue zetia 10 mg daily   5. Respiratory failure with hypoxia: is on 02 at 4/L. Will continue duoneb every 4 hours as needed  6. Gerd: will continue protonix 40 mg daily   7. BPH: will continue flomax 0.4 mg daily   8. acquired polycythemia: has iron def anemia: hgb 10.4; will continue niferex 150 mg daily    9. Right upper lobe lung mass:  Will setup an oncology and pulmonary consult and will monitor his status.   Will check cbc; cmp in one week Will setup MRI of brain with and without contrast Will setup a palliative consult   Time spent with patient  50   minutes >50% time spent counseling; reviewing medical record; tests; labs; and developing future plan of care   MD is aware of resident's narcotic use and is in agreement with current plan of care. We will attempt to wean resident as apropriate   Ok Edwards NP [facility]  Contact [phone] Monday through Friday 8am- 5pm  After hours call [phone]

## 2016-09-14 ENCOUNTER — Other Ambulatory Visit: Payer: Self-pay | Admitting: Adult Health

## 2016-09-14 DIAGNOSIS — C3490 Malignant neoplasm of unspecified part of unspecified bronchus or lung: Secondary | ICD-10-CM

## 2016-09-15 ENCOUNTER — Ambulatory Visit (INDEPENDENT_AMBULATORY_CARE_PROVIDER_SITE_OTHER): Payer: Medicare Other | Admitting: Pulmonary Disease

## 2016-09-15 ENCOUNTER — Encounter: Payer: Self-pay | Admitting: Internal Medicine

## 2016-09-15 ENCOUNTER — Non-Acute Institutional Stay (SKILLED_NURSING_FACILITY): Payer: Medicare Other | Admitting: Internal Medicine

## 2016-09-15 ENCOUNTER — Encounter: Payer: Self-pay | Admitting: Pulmonary Disease

## 2016-09-15 ENCOUNTER — Ambulatory Visit (INDEPENDENT_AMBULATORY_CARE_PROVIDER_SITE_OTHER)
Admission: RE | Admit: 2016-09-15 | Discharge: 2016-09-15 | Disposition: A | Discharge status: Home / Self Care | Payer: Medicare Other | Source: Ambulatory Visit | Attending: Pulmonary Disease | Admitting: Pulmonary Disease

## 2016-09-15 VITALS — BP 128/78 | HR 67 | Ht 69.0 in

## 2016-09-15 DIAGNOSIS — R9089 Other abnormal findings on diagnostic imaging of central nervous system: Secondary | ICD-10-CM | POA: Diagnosis not present

## 2016-09-15 DIAGNOSIS — F17200 Nicotine dependence, unspecified, uncomplicated: Secondary | ICD-10-CM

## 2016-09-15 DIAGNOSIS — R970 Elevated carcinoembryonic antigen [CEA]: Secondary | ICD-10-CM

## 2016-09-15 DIAGNOSIS — I482 Chronic atrial fibrillation, unspecified: Secondary | ICD-10-CM

## 2016-09-15 DIAGNOSIS — J9601 Acute respiratory failure with hypoxia: Secondary | ICD-10-CM | POA: Diagnosis not present

## 2016-09-15 DIAGNOSIS — R945 Abnormal results of liver function studies: Secondary | ICD-10-CM

## 2016-09-15 DIAGNOSIS — J90 Pleural effusion, not elsewhere classified: Secondary | ICD-10-CM

## 2016-09-15 DIAGNOSIS — E43 Unspecified severe protein-calorie malnutrition: Secondary | ICD-10-CM | POA: Diagnosis not present

## 2016-09-15 DIAGNOSIS — R7989 Other specified abnormal findings of blood chemistry: Secondary | ICD-10-CM

## 2016-09-15 DIAGNOSIS — E878 Other disorders of electrolyte and fluid balance, not elsewhere classified: Secondary | ICD-10-CM

## 2016-09-15 DIAGNOSIS — I10 Essential (primary) hypertension: Secondary | ICD-10-CM

## 2016-09-15 DIAGNOSIS — D508 Other iron deficiency anemias: Secondary | ICD-10-CM | POA: Diagnosis not present

## 2016-09-15 DIAGNOSIS — I5032 Chronic diastolic (congestive) heart failure: Secondary | ICD-10-CM | POA: Diagnosis not present

## 2016-09-15 DIAGNOSIS — R918 Other nonspecific abnormal finding of lung field: Secondary | ICD-10-CM

## 2016-09-15 NOTE — Progress Notes (Signed)
Patient ID: Steven Krause, male   DOB: [birth year], 76 y.o.   MRN: [id number]    HISTORY AND PHYSICAL   DATE: 09/15/2016  Location:    [facility] Room Number: 101 A Place of Service: SNF (31)   Extended Emergency Contact Information Primary Emergency Contact: [place] Phone: [phone] Relation: Friend  Advanced Directive information Does Patient Have a Medical Advance Directive?: Yes, Would patient like information on creating a medical advance directive?: No - Patient declined, Type of Advance Directive: Out of facility DNR (pink MOST or yellow form), Pre-existing out of facility DNR order (yellow form or pink MOST form): Yellow form placed in chart (order not valid for inpatient use), Does patient want to make changes to medical advance directive?: No - Patient declined  Chief Complaint  Patient presents with  . New Admit To SNF    Admission    HPI:  76 yo male seen today as a new admission into SNF following hospital stay for RUL CAP, acute respiratory failure with hypoxia, electrolyte derangement (low Na and K), HTN, tobacco abuse, afib on eliquis, chronic dHF, RUL mass (dx in 04/2016 and failed to f/u), hyperlipidemia, anemia. He presented to the ED with increasing SOB and recurrent pneumonia and RUL mass. IR right thoracentesis on 09/09/16 yielded approx 1.1L serous fluid. Cytology of right pleural fluid showed reactive mesothelial cells but no malignant cells. CEA 91.8. 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 K 3.3-->3.8; Hgb 10.8; Cr 0.81; albumin 2.7; AST 134-->168; ALT 41-->48; alk phos 228-->290; Na dropped 131-->136; ABG 7.429/47/67/31 with O2 sat 94% at 4L/min. He presents to SNF for short term rehab.  Today he reports SOB. No other concerns. He states he is ready to see oncology and pulmonary. No nursing issues. No falls. Appetite ok and sleeps well.   chronic diastolic heart failure - EF 60-65% (05-05-16). Takes  lasix 40 mg twice daily with k+ 20 meq daily; aldactone 25 mg daily. Followed by cardio Dr CSallyanne Kuster Hypertension - BP stable on norvasc 10 mg daily; aldactone 25 mg daily   PAF - rate controlled. He takes eliquis 5 mg twice daily for anticoagulation   Dyslipidemia - stable on zetia 10 mg daily   Chronic Respiratory failure with hypoxia - O2 dependent @ 4L./min. He gets duoneb every 4 hours as needed  GERD - stable on protonix 40 mg daily   BPH - stable on flomax 0.4 mg daily   Hx anemia - due to iron deficiency. Hgb 10.4. 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 Current plan is to be followed by pulmonary Dr Nestor/Wert and oncology  Past Medical History:  Diagnosis Date  . Abdominal pain, generalized   . Acquired polycythemia 01/05/2011  . Allergy   . Diarrhea   . Dizziness and giddiness   . Hernia of unspecified site of abdominal cavity without mention of obstruction or gangrene   . Hypertension   . Hypopotassemia   . Iron excess 01/05/2011  . Malignancy ([facility]   . Memory loss   . Other specified cardiac dysrhythmias(427.89)   . 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 Polycythemia, secondary   . Systemic hypertension   . Tobacco use disorder   . Unspecified disorder of kidney and ureter   . Unspecified disorder of skin and subcutaneous tissue   . Unspecified hearing loss  Past Surgical History:  Procedure Laterality Date  . CHOLECYSTECTOMY    . COLONOSCOPY N/A 05/10/2016   Procedure: COLONOSCOPY;  Surgeon: Manus Gunning, MD;  Location: [facility];  Service: Gastroenterology;  Laterality: N/A;  . ESOPHAGOGASTRODUODENOSCOPY N/A 05/10/2016   Procedure: ESOPHAGOGASTRODUODENOSCOPY (EGD);  Surgeon: Manus Gunning, MD;  Location: [facility];  Service: Gastroenterology;  Laterality: N/A;  . GIVENS CAPSULE STUDY N/A 05/10/2016   Procedure: GIVENS CAPSULE STUDY;  Surgeon: Manus Gunning,  MD;  Location: [facility];  Service: Gastroenterology;  Laterality: N/A;  . INFLAMED COLON    . IR THORACENTESIS ASP PLEURAL SPACE W/IMG GUIDE  09/09/2016  . LEFT HEART CATH AND CORONARY ANGIOGRAPHY N/A 05/06/2016   Procedure: Left Heart Cath and Coronary Angiography;  Surgeon: Peter M Martinique, MD;  Location: [facility];  Service: Cardiovascular;  Laterality: N/A;  . NM MYOCAR PERF WALL MOTION  8//30/11   normal  . US ECHOCARDIOGRAPHY  11/03/09   EF 50-55%,  . VENTRAL HERNIA REPAIR      Patient Care Team: Gayland Curry, DO as PCP - General (Geriatric Medicine)  Social History   Social History  . Marital status: Single    Spouse name: N/A  . Number of children: N/A  . Years of education: N/A   Occupational History  . Not on file.   Social History Main Topics  . Smoking status: Former Smoker    Packs/day: 0.25    Years: 55.00    Types: Pipe    Quit date: 05/01/2016  . Smokeless tobacco: Never Used  . Alcohol use No  . Drug use: No  . Sexual activity: Not Currently   Other Topics Concern  . Not on file   Social History Narrative   Friend [name] is contact and helps with his finances, appts, etc.     reports that he quit smoking about 4 months ago. His smoking use included Pipe. He has a 13.75 pack-year smoking history. He has never used smokeless tobacco. He reports that he does not drink alcohol or use drugs.  Family History  Problem Relation Age of Onset  . Colon polyps Mother 40  . Colon cancer Mother   . Heart disease Father   . Diabetes Sister   . Cancer Brother   . Diabetes Sister   . Diabetes Sister   . Heart disease Sister   . Stomach cancer Neg Hx   . Rectal cancer Neg Hx    Family Status  Relation Status  . Mother Deceased  . Father Deceased  . Sister Alive  . Brother Deceased  . Sister Deceased  . Sister Deceased  . Sister Deceased  . Neg Hx (Not Specified)    Immunization History  Administered Date(s) Administered  .  Influenza,inj,Quad PF,36+ Mos 12/13/2012, 01/03/2014, 11/14/2014, 12/25/2015  . Influenza-Unspecified 11/12/2009, 12/10/2010, 12/28/2011  . PPD Test 09/13/2016  . Pneumococcal Conjugate-13 12/01/2014  . Pneumococcal-Unspecified 06/17/2009  . Tdap 04/21/2011    Allergies  Allergen Reactions  . Statins Nausea And Vomiting    Medications: Patient's Medications  New Prescriptions   No medications on file  Previous Medications   AMLODIPINE (NORVASC) 10 MG TABLET    Take 10 mg by mouth daily.   APIXABAN (ELIQUIS) 5 MG TABS TABLET    Take 1 tablet (5 mg total) by mouth 2 (two) times daily.   EZETIMIBE (ZETIA) 10 MG TABLET    Take 1 tablet (10 mg total) by mouth daily.   FUROSEMIDE (LASIX)  40 MG TABLET    TAKE 1 TABLET IN THE MORNING AND TAKE 1 TABLET IN THE EVENING   GUAIFENESIN (MUCINEX) 600 MG 12 HR TABLET    Take 1 tablet (600 mg total) by mouth 2 (two) times daily as needed for cough or to loosen phlegm.   HYDROCODONE-ACETAMINOPHEN (NORCO) 5-325 MG TABLET    Take 1 tablet by mouth every 6 (six) hours as needed for moderate pain.   IPRATROPIUM-ALBUTEROL (DUONEB) 0.5-2.5 (3) MG/3ML SOLN    Take 3 mLs by nebulization every 4 (four) hours as needed.   IRON POLYSACCHARIDES (NIFEREX) 150 MG CAPSULE    Take 1 capsule (150 mg total) by mouth daily.   OXYGEN    Inhale 4 L/min into the lungs continuous.   PANTOPRAZOLE (PROTONIX) 40 MG TABLET    Take 1 tablet (40 mg total) by mouth daily at 6 (six) AM.   POTASSIUM CHLORIDE SA (K-DUR,KLOR-CON) 20 MEQ TABLET    Take 1 tablet (20 mEq total) by mouth daily.   SPIRONOLACTONE (ALDACTONE) 25 MG TABLET    Take 1 tablet (25 mg total) by mouth daily.   TAMSULOSIN (FLOMAX) 0.4 MG CAPS CAPSULE    Take 1 capsule (0.4 mg total) by mouth daily.  Modified Medications   No medications on file  Discontinued Medications   No medications on file    Review of Systems  Respiratory: Positive for shortness of breath.   All other systems reviewed and are  negative.   Vitals:   09/15/16 1107  BP: 134/77  Pulse: 80  Resp: 20  Temp: 98.4 F (36.9 C)  TempSrc: Oral  SpO2: 96%  Weight: 212 lb (96.2 kg)  Height: 5' 9"  (1.753 m)   Body mass index is 31.31 kg/m.  Physical Exam  Constitutional: He is oriented to person, place, and time. He appears well-developed and well-nourished.  Frail appearing, sitting in chair at bedside in NAD;  O2 intact @ 4L/min  HENT:  Mouth/Throat: Oropharynx is clear and moist.  Poor dentition; MMM; no oral thrush  Eyes: Pupils are equal, round, and reactive to light. No scleral icterus.  Neck: Neck supple. Carotid bruit is not present. No thyromegaly present.  Cardiovascular: Normal rate and intact distal pulses.  An irregularly irregular rhythm present. Exam reveals no gallop and no friction rub.   Murmur heard.  Systolic murmur is present with a grade of 1/6  +2 pitting LE edema b/l. No calf TTP  Pulmonary/Chest: Effort normal. He has no wheezes. He has rhonchi (right). He has no rales. He exhibits no tenderness.  Abdominal: Soft. Normal appearance and bowel sounds are normal. He exhibits no distension, no abdominal bruit, no pulsatile midline mass and no mass. There is no hepatomegaly. There is no tenderness. There is no rigidity, no rebound and no guarding. A hernia is present. Hernia confirmed positive in the ventral area.  obese  Lymphadenopathy:    He has no cervical adenopathy.  Neurological: He is alert and oriented to person, place, and time. He has normal reflexes.  Skin: Skin is warm and dry. No rash noted.  Psychiatric: He has a normal mood and affect. His behavior is normal. Judgment and thought content normal.     Labs reviewed: Admission on 09/04/2016, Discharged on 09/12/2016  Component Date Value Ref Range Status  . Sodium 09/04/2016 133* 135 - 145 mmol/L Final  . Potassium 09/04/2016 3.3* 3.5 - 5.1 mmol/L Final  . Chloride 09/04/2016 96* 101 - 111 mmol/L Final  .  CO2 09/04/2016  26  22 - 32 mmol/L Final  . Glucose, Bld 09/04/2016 117* 65 - 99 mg/dL Final  . BUN 09/04/2016 13  6 - 20 mg/dL Final  . Creatinine, Ser 09/04/2016 0.96  0.61 - 1.24 mg/dL Final  . Calcium 09/04/2016 8.9  8.9 - 10.3 mg/dL Final  . GFR calc non Af Amer 09/04/2016 >60  >60 mL/min Final  . GFR calc Af Amer 09/04/2016 >60  >60 mL/min Final   Comment: (NOTE) The eGFR has been calculated using the CKD EPI equation. This calculation has not been validated in all clinical situations. eGFR's persistently <60 mL/min signify possible Chronic Kidney Disease.   . Anion gap 09/04/2016 11  5 - 15 Final  . WBC 09/04/2016 7.9  4.0 - 10.5 K/uL Final  . RBC 09/04/2016 4.63  4.22 - 5.81 MIL/uL Final  . Hemoglobin 09/04/2016 11.5* 13.0 - 17.0 g/dL Final  . HCT 09/04/2016 37.5* 39.0 - 52.0 % Final  . MCV 09/04/2016 81.0  78.0 - 100.0 fL Final  . MCH 09/04/2016 24.8* 26.0 - 34.0 pg Final  . MCHC 09/04/2016 30.7  30.0 - 36.0 g/dL Final  . RDW 09/04/2016 19.1* 11.5 - 15.5 % Final  . Platelets 09/04/2016 263  150 - 400 K/uL Final  . Neutrophils Relative % 09/04/2016 84  % Final  . Neutro Abs 09/04/2016 6.7  1.7 - 7.7 K/uL Final  . Lymphocytes Relative 09/04/2016 8  % Final  . Lymphs Abs 09/04/2016 0.7  0.7 - 4.0 K/uL Final  . Monocytes Relative 09/04/2016 6  % Final  . Monocytes Absolute 09/04/2016 0.5  0.1 - 1.0 K/uL Final  . Eosinophils Relative 09/04/2016 1  % Final  . Eosinophils Absolute 09/04/2016 0.1  0.0 - 0.7 K/uL Final  . Basophils Relative 09/04/2016 1  % Final  . Basophils Absolute 09/04/2016 0.0  0.0 - 0.1 K/uL Final  . Troponin i, poc 09/04/2016 0.00  0.00 - 0.08 ng/mL Final  . Comment 3 09/04/2016          Final   Comment: Due to the release kinetics of cTnI, a negative result within the first hours of the onset of symptoms does not rule out myocardial infarction with certainty. If myocardial infarction is still suspected, repeat the test at appropriate intervals.   Marland Kitchen Specimen  Description 09/04/2016 BLOOD LEFT ANTECUBITAL   Final  . Special Requests 09/04/2016 BOTTLES DRAWN AEROBIC AND ANAEROBIC Blood Culture adequate volume   Final  . Culture 09/04/2016 NO GROWTH 5 DAYS   Final  . Report Status 09/04/2016 09/09/2016 FINAL   Final  . Specimen Description 09/04/2016 BLOOD LEFT FOREARM   Final  . Special Requests 09/04/2016 BOTTLES DRAWN AEROBIC AND ANAEROBIC Blood Culture adequate volume   Final  . Culture 09/04/2016 NO GROWTH 5 DAYS   Final  . Report Status 09/04/2016 09/09/2016 FINAL   Final  . Specimen Description 09/04/2016 SPUTUM   Final  . Special Requests 09/04/2016 NONE   Final  . Sputum evaluation 09/04/2016 THIS SPECIMEN IS ACCEPTABLE FOR SPUTUM CULTURE   Final  . Report Status 09/04/2016 09/05/2016 FINAL   Final  . Specimen Description 09/04/2016 URINE, RANDOM   Final  . Special Requests 09/04/2016 NONE   Final  . Culture 09/04/2016 <10,000 COLONIES/mL INSIGNIFICANT GROWTH*  Final  . Report Status 09/04/2016 09/05/2016 FINAL   Final  . L. pneumophila Serogp 1 Ur Ag 09/04/2016 Negative  Negative Final   Comment: (NOTE) Presumptive negative for L. pneumophila  serogroup 1 antigen in urine, suggesting no recent or current infection. Legionnaires' disease cannot be ruled out since other serogroups and species may also cause disease. Performed At: [facility] [place], [place] [id number] Lindon Romp MD MI:[id number]   . Source of Sample 09/04/2016 URINE, RANDOM   Final  . Strep Pneumo Urinary Antigen 09/04/2016 NEGATIVE  NEGATIVE Final   Comment:        Infection due to S. pneumoniae cannot be absolutely ruled out since the antigen present may be below the detection limit of the test.   . Lactic Acid, Venous 09/04/2016 4.6* 0.5 - 1.9 mmol/L Final   Comment: CRITICAL RESULT CALLED TO, READ BACK BY AND VERIFIED WITH: ASHLEY TOBIASDICKUN RN AT 2500 09/04/16 BY [place]   . Lactic Acid, Venous 09/04/2016 3.8* 0.5 - 1.9  mmol/L Final   Comment: CRITICAL RESULT CALLED TO, READ BACK BY AND VERIFIED WITH: Elray Mcgregor RN AT 1906 09/04/16 BY [place]   . Sodium 09/05/2016 134* 135 - 145 mmol/L Final  . Potassium 09/05/2016 3.2* 3.5 - 5.1 mmol/L Final  . Chloride 09/05/2016 98* 101 - 111 mmol/L Final  . CO2 09/05/2016 30  22 - 32 mmol/L Final  . Glucose, Bld 09/05/2016 139* 65 - 99 mg/dL Final  . BUN 09/05/2016 12  6 - 20 mg/dL Final  . Creatinine, Ser 09/05/2016 0.92  0.61 - 1.24 mg/dL Final  . Calcium 09/05/2016 8.1* 8.9 - 10.3 mg/dL Final  . Total Protein 09/05/2016 6.8  6.5 - 8.1 g/dL Final  . Albumin 09/05/2016 2.7* 3.5 - 5.0 g/dL Final  . AST 09/05/2016 134* 15 - 41 U/L Final  . ALT 09/05/2016 41  17 - 63 U/L Final  . Alkaline Phosphatase 09/05/2016 228* 38 - 126 U/L Final  . Total Bilirubin 09/05/2016 0.7  0.3 - 1.2 mg/dL Final  . GFR calc non Af Amer 09/05/2016 >60  >60 mL/min Final  . GFR calc Af Amer 09/05/2016 >60  >60 mL/min Final   Comment: (NOTE) The eGFR has been calculated using the CKD EPI equation. This calculation has not been validated in all clinical situations. eGFR's persistently <60 mL/min signify possible Chronic Kidney Disease.   . Anion gap 09/05/2016 6  5 - 15 Final  . WBC 09/05/2016 7.9  4.0 - 10.5 K/uL Final  . RBC 09/05/2016 4.23  4.22 - 5.81 MIL/uL Final  . Hemoglobin 09/05/2016 10.4* 13.0 - 17.0 g/dL Final  . HCT 09/05/2016 34.6* 39.0 - 52.0 % Final  . MCV 09/05/2016 81.8  78.0 - 100.0 fL Final  . MCH 09/05/2016 24.6* 26.0 - 34.0 pg Final  . MCHC 09/05/2016 30.1  30.0 - 36.0 g/dL Final  . RDW 09/05/2016 19.3* 11.5 - 15.5 % Final  . Platelets 09/05/2016 259  150 - 400 K/uL Final  . Lactic Acid, Venous 09/04/2016 2.5* 0.5 - 1.9 mmol/L Final   Comment: CRITICAL RESULT CALLED TO, READ BACK BY AND VERIFIED WITH: FUTRELL,M RN 09/04/2016 2250 [address]   . Lactic Acid, Venous 09/05/2016 1.9  0.5 - 1.9 mmol/L Final  . Lactic Acid, Venous 09/05/2016 2.5* 0.5 - 1.9  mmol/L Final   Comment: CRITICAL RESULT CALLED TO, READ BACK BY AND VERIFIED WITH: Kathrin Greathouse [id number] [place]   . Lactic Acid, Venous 09/05/2016 1.7  0.5 - 1.9 mmol/L Final  . Specimen Description 09/04/2016 SPUTUM   Final  . Special Requests 09/04/2016 NONE Reflexed from [id number]   Final  . Gram Stain 09/04/2016  Final                   Value:RARE WBC PRESENT, PREDOMINANTLY PMN FEW GRAM POSITIVE COCCI IN PAIRS FEW GRAM VARIABLE ROD FEW GRAM NEGATIVE RODS   . Culture 09/04/2016 Consistent with normal respiratory flora.   Final  . Report Status 09/04/2016 09/07/2016 FINAL   Final  . Magnesium 09/06/2016 1.8  1.7 - 2.4 mg/dL Final  . CEA 09/06/2016 91.8* 0.0 - 4.7 ng/mL Final   Comment: (NOTE)       Roche ECLIA methodology       Nonsmokers  <3.9                                     Smokers     <5.6 Performed At: [facility] [place], [place] [id number] Lindon Romp MD LX:[id number]   . WBC 09/07/2016 8.6  4.0 - 10.5 K/uL Final  . RBC 09/07/2016 4.45  4.22 - 5.81 MIL/uL Final  . Hemoglobin 09/07/2016 10.8* 13.0 - 17.0 g/dL Final  . HCT 09/07/2016 37.2* 39.0 - 52.0 % Final  . MCV 09/07/2016 83.6  78.0 - 100.0 fL Final  . MCH 09/07/2016 24.3* 26.0 - 34.0 pg Final  . MCHC 09/07/2016 29.0* 30.0 - 36.0 g/dL Final  . RDW 09/07/2016 19.8* 11.5 - 15.5 % Final  . Platelets 09/07/2016 269  150 - 400 K/uL Final  . Sodium 09/07/2016 135  135 - 145 mmol/L Final  . Potassium 09/07/2016 3.7  3.5 - 5.1 mmol/L Final  . Chloride 09/07/2016 98* 101 - 111 mmol/L Final  . CO2 09/07/2016 31  22 - 32 mmol/L Final  . Glucose, Bld 09/07/2016 121* 65 - 99 mg/dL Final  . BUN 09/07/2016 12  6 - 20 mg/dL Final  . Creatinine, Ser 09/07/2016 0.93  0.61 - 1.24 mg/dL Final  . Calcium 09/07/2016 8.5* 8.9 - 10.3 mg/dL Final  . Total Protein 09/07/2016 7.0  6.5 - 8.1 g/dL Final  . Albumin 09/07/2016 2.7* 3.5 - 5.0 g/dL Final  . AST 09/07/2016 168* 15 - 41 U/L Final  . ALT  09/07/2016 48  17 - 63 U/L Final  . Alkaline Phosphatase 09/07/2016 290* 38 - 126 U/L Final  . Total Bilirubin 09/07/2016 0.8  0.3 - 1.2 mg/dL Final  . GFR calc non Af Amer 09/07/2016 >60  >60 mL/min Final  . GFR calc Af Amer 09/07/2016 >60  >60 mL/min Final   Comment: (NOTE) The eGFR has been calculated using the CKD EPI equation. This calculation has not been validated in all clinical situations. eGFR's persistently <60 mL/min signify possible Chronic Kidney Disease.   . Anion gap 09/07/2016 6  5 - 15 Final  . B Natriuretic Peptide 09/08/2016 290.2* 0.0 - 100.0 pg/mL Final  . Sodium 09/09/2016 132* 135 - 145 mmol/L Final  . Potassium 09/09/2016 3.7  3.5 - 5.1 mmol/L Final  . Chloride 09/09/2016 92* 101 - 111 mmol/L Final  . CO2 09/09/2016 34* 22 - 32 mmol/L Final  . Glucose, Bld 09/09/2016 146* 65 - 99 mg/dL Final  . BUN 09/09/2016 17  6 - 20 mg/dL Final  . Creatinine, Ser 09/09/2016 0.95  0.61 - 1.24 mg/dL Final  . Calcium 09/09/2016 8.7* 8.9 - 10.3 mg/dL Final  . GFR calc non Af Amer 09/09/2016 >60  >60 mL/min Final  . GFR calc Af Amer 09/09/2016 >60  >  60 mL/min Final   Comment: (NOTE) The eGFR has been calculated using the CKD EPI equation. This calculation has not been validated in all clinical situations. eGFR's persistently <60 mL/min signify possible Chronic Kidney Disease.   . Anion gap 09/09/2016 6  5 - 15 Final  . Sodium 09/10/2016 131* 135 - 145 mmol/L Final  . Potassium 09/10/2016 3.4* 3.5 - 5.1 mmol/L Final  . Chloride 09/10/2016 92* 101 - 111 mmol/L Final  . CO2 09/10/2016 31  22 - 32 mmol/L Final  . Glucose, Bld 09/10/2016 115* 65 - 99 mg/dL Final  . BUN 09/10/2016 17  6 - 20 mg/dL Final  . Creatinine, Ser 09/10/2016 0.89  0.61 - 1.24 mg/dL Final  . Calcium 09/10/2016 8.6* 8.9 - 10.3 mg/dL Final  . GFR calc non Af Amer 09/10/2016 >60  >60 mL/min Final  . GFR calc Af Amer 09/10/2016 >60  >60 mL/min Final   Comment: (NOTE) The eGFR has been calculated using the  CKD EPI equation. This calculation has not been validated in all clinical situations. eGFR's persistently <60 mL/min signify possible Chronic Kidney Disease.   . Anion gap 09/10/2016 8  5 - 15 Final  . O2 Content 09/10/2016 4.0  L/min Final  . Delivery systems 09/10/2016 NASAL CANNULA   Final  . pH, Arterial 09/10/2016 7.429  7.350 - 7.450 Final  . pCO2 arterial 09/10/2016 47.2  32.0 - 48.0 mmHg Final  . pO2, Arterial 09/10/2016 66.5* 83.0 - 108.0 mmHg Final  . Bicarbonate 09/10/2016 30.9* 20.0 - 28.0 mmol/L Final  . Acid-Base Excess 09/10/2016 6.4* 0.0 - 2.0 mmol/L Final  . O2 Saturation 09/10/2016 93.7  % Final  . Patient temperature 09/10/2016 97.9   Final  . Collection site 09/10/2016 RIGHT RADIAL   Final  . Drawn by 09/10/2016 [id number]   Final  . Sample type 09/10/2016 ARTERIAL DRAW   Final  . Chauncey Reading test (pass/fail) 09/10/2016 PASS  PASS Final  . Sodium 09/11/2016 133* 135 - 145 mmol/L Final  . Potassium 09/11/2016 4.0  3.5 - 5.1 mmol/L Final  . Chloride 09/11/2016 94* 101 - 111 mmol/L Final  . CO2 09/11/2016 31  22 - 32 mmol/L Final  . Glucose, Bld 09/11/2016 101* 65 - 99 mg/dL Final  . BUN 09/11/2016 14  6 - 20 mg/dL Final  . Creatinine, Ser 09/11/2016 0.86  0.61 - 1.24 mg/dL Final  . Calcium 09/11/2016 8.5* 8.9 - 10.3 mg/dL Final  . GFR calc non Af Amer 09/11/2016 >60  >60 mL/min Final  . GFR calc Af Amer 09/11/2016 >60  >60 mL/min Final   Comment: (NOTE) The eGFR has been calculated using the CKD EPI equation. This calculation has not been validated in all clinical situations. eGFR's persistently <60 mL/min signify possible Chronic Kidney Disease.   . Anion gap 09/11/2016 8  5 - 15 Final  . Sodium 09/12/2016 136  135 - 145 mmol/L Final  . Potassium 09/12/2016 3.8  3.5 - 5.1 mmol/L Final  . Chloride 09/12/2016 94* 101 - 111 mmol/L Final  . CO2 09/12/2016 33* 22 - 32 mmol/L Final  . Glucose, Bld 09/12/2016 97  65 - 99 mg/dL Final  . BUN 09/12/2016 16  6 - 20 mg/dL Final   . Creatinine, Ser 09/12/2016 0.81  0.61 - 1.24 mg/dL Final  . Calcium 09/12/2016 8.7* 8.9 - 10.3 mg/dL Final  . GFR calc non Af Amer 09/12/2016 >60  >60 mL/min Final  . GFR calc Af Amer 09/12/2016 >60  >  60 mL/min Final   Comment: (NOTE) The eGFR has been calculated using the CKD EPI equation. This calculation has not been validated in all clinical situations. eGFR's persistently <60 mL/min signify possible Chronic Kidney Disease.   . Anion gap 09/12/2016 9  5 - 15 Final    Dg Chest 1 View  Result Date: 09/09/2016 CLINICAL DATA:  S/p thoracentesis 1.1 L removed from right side EXAM: CHEST  1 VIEW COMPARISON:  09/07/2016 FINDINGS: No pneumothorax. No definite residual effusion. Left lung clear. Improved aeration of the right lung base. Right mid lung and hilar mass as before. Heart size upper limits normal for technique. Visualized bones unremarkable. IMPRESSION: 1. No pneumothorax post right thoracentesis. Electronically Signed   By: Lucrezia Europe M.D.   On: 09/09/2016 15:17   Dg Chest 2 View  Result Date: 09/05/2016 CLINICAL DATA:  Pneumonia with shortness of breath EXAM: CHEST  2 VIEW COMPARISON:  September 04, 2016 chest radiograph and chest CT May 04, 2016 FINDINGS: There is persistent airspace consolidation in the anterior segment right upper lobe with without appreciable change from 1 day prior. There is new focal airspace opacity in the lateral right base with minimal right pleural effusion. Left lung is clear. Heart is upper normal in size with mild pulmonary venous hypertension. No adenopathy evident. No bone lesions. IMPRESSION: Extensive airspace consolidation anterior segment right upper lobe, stable from 1 day prior and consistent with pneumonia. New focal presumed pneumonia lateral right base. New small right pleural effusion. Left lung unchanged. Stable cardiac silhouette with a degree of pulmonary vascular congestion. Followup PA and lateral chest radiographs recommended in 3-4 weeks  following trial of antibiotic therapy to ensure resolution and exclude underlying malignancy. Electronically Signed   By: Lowella Grip III M.D.   On: 09/05/2016 07:49   Dg Chest 2 View  Result Date: 09/04/2016 CLINICAL DATA:  Productive cough and fever. EXAM: CHEST  2 VIEW COMPARISON:  05/04/2016 FINDINGS: Heart size is normal. Parenchymal consolidation is seen in the anterior right upper lobe which is new since prior study. This is suspicious for pneumonia. No evidence of pleural effusion. Mild scarring in right lung base is stable since previous exam. IMPRESSION: New parenchymal consolidation in the anterior right upper lobe, suspicious for pneumonia. Followup PA and lateral chest X-ray is recommended in 3-4 weeks following trial of antibiotic therapy to ensure resolution and exclude underlying malignancy. Electronically Signed   By: Earle Gell M.D.   On: 09/04/2016 10:47   Ct Chest W Contrast  Result Date: 09/06/2016 CLINICAL DATA:  Short of breath on exertion for 6 months. Cough for 3 months. Ex-smoker, quitting in February. EXAM: CT CHEST WITH CONTRAST TECHNIQUE: Multidetector CT imaging of the chest was performed during intravenous contrast administration. CONTRAST:  33m ISOVUE-300 IOPAMIDOL (ISOVUE-300) INJECTION 61% COMPARISON:  Plain films including 1 day prior.  CT of 05/04/2016. FINDINGS: Cardiovascular: Mild motion degradation throughout. Aortic and branch vessel atherosclerosis. Tortuous thoracic aorta. Moderate cardiomegaly, without pericardial effusion. Multivessel coronary artery atherosclerosis. Pulmonary artery enlargement, 3.3 cm outflow tract. No central pulmonary embolism, on this non-dedicated study. Mediastinum/Nodes: No supraclavicular adenopathy. Nonspecific right-sided thyroid nodules. Progressive thoracic adenopathy. Example precarinal node at 2.7 cm on image 50/series 3. Direct tumor extension in the right-side of the mediastinum, including on image 57/series 3. Right hilar  involvement by tumor and/or adenopathy, including on image 68/series 3. Developing adenopathy within the azygoesophageal recess. This measures 1.7 cm on image 78/series 3. Left infrahilar adenopathy at 1.5 cm on  image 76/series 3. Lungs/Pleura: Small right-sided pleural effusion is slightly increased. Trace left pleural fluid is similar. Right upper lobe endobronchial compression or obstruction. Marked worsening of right upper lobe aeration with partial opacification by masslike soft tissue density without air bronchograms. Surrounding areas of nodular airspace opacity, septal thickening and ground-glass throughout the remainder of the right upper lobe. Dependent right lower lobe atelectasis. Similar right lower lobe nodularity including at 7 mm on image 87/series 7. A right apical 5 mm nodule is similar on image 21/series 7. Upper Abdomen: Development of heterogeneous density throughout the liver, most consistent with widespread metastatic disease. most apparent on series 8. Normal imaged portions of the spleen, stomach, pancreas, adrenal glands. Increased number and size of upper abdominal retroperitoneal nodes. Example retrocaval node of 1.3 cm on image 56/series 8 versus 1.0 cm on the prior. Apparent collateral vein entering the IVC in the right-sided abdomen on image 55/series 8, similar and of indeterminate etiology. Incompletely imaged abdominal wall laxity and hernia containing nonobstructive bowel. Musculoskeletal: No acute osseous abnormality. IMPRESSION: 1. 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 2. Progressive masslike opacity throughout the right upper lobe with direct extension to the right hilum on right side of the mediastinum. 3. New or progressive thoracic nodal metastasis. 4. Development of extensive hepatic and upper abdominal nodal metastasis. 5. Mild motion degradation. 6. Other areas of right upper lobe ground-glass and pulmonary opacity could  represent postobstructive pneumonitis and/or lymphangitic tumor. 7. Increase in small right and similar trace left pleural effusion. 8. Incompletely imaged collateral vein about the right-side of the abdomen is nonspecific. This could be related to chronic IVC thrombus or even an incompletely imaged right renal mass. 9. Pulmonary artery enlargement suggests pulmonary arterial hypertension. 10. Coronary artery atherosclerosis. Aortic Atherosclerosis (ICD10-I70.0). These results will be called to the ordering clinician or representative by the Radiologist Assistant, and communication documented in the PACS or zVision Dashboard. Electronically Signed   By: Abigail Miyamoto M.D.   On: 09/06/2016 13:50   Mr Jeri Cos YI Contrast  Result Date: 09/10/2016 CLINICAL DATA:  Suspected bronchogenic carcinoma.  Staging. EXAM: MRI HEAD WITHOUT AND WITH CONTRAST TECHNIQUE: Multiplanar, multiecho pulse sequences of the brain and surrounding structures were obtained without and with intravenous contrast. CONTRAST:  20 cc MultiHance intravenous COMPARISON:  None. FINDINGS: Brain: 6 mm focus of restricted diffusion and enhancement in the right putamen. No additional patent enhancement is noted. There is a background of chronic microvascular ischemia in the cerebral white matter with remote lacunar infarct in the right thalamus. No hemorrhage, hydrocephalus, or mass effect. Remote hemorrhagic focus in the left cerebellum without associated enhancement. Vascular: Major flow voids are preserved. Skull and upper cervical spine: Symmetric hypointense marrow in the cervical spine, likely related to patient's history of polycythemia. Dorsal clivus marrow is heterogeneous, without definite focal masslike area. This finding will be re- evaluated at short follow-up. Sinuses/Orbits: Negative Other: Moderately motion degraded study, which could obscure pathologic findings. IMPRESSION: 1. 6 mm subacute infarct versus solitary metastasis in the right  putamen. MRI in 6 weeks should be able to differentiate. 2. Moderately motion degraded study. 3. Heterogeneous marrow in the clivus, attention on follow-up. Electronically Signed   By: Monte Fantasia M.D.   On: 09/10/2016 10:49   Dg Chest Port 1 View  Result Date: 09/07/2016 CLINICAL DATA:  Hypoxia EXAM: PORTABLE CHEST 1 VIEW COMPARISON:  Chest CT 09/06/2016 Chest radiograph 09/05/2016 FINDINGS: There is continued consolidation of the lower  portion of the right upper lobe with prominence of the right hilum consistent with hilar mass. There is a small right pleural effusion. Mild airspace opacities in the right lower lobe. Left lung is clear. No pneumothorax. IMPRESSION: Partial right upper lobe consolidation, likely postobstructive in the setting of right hilar mass. The findings have slightly worsened from the prior chest radiograph of 09/05/2016. Small right pleural effusion. Electronically Signed   By: Ulyses Jarred M.D.   On: 09/07/2016 06:10   Ir Thoracentesis Asp Pleural Space W/img Guide  Result Date: 09/09/2016 INDICATION: Right lung mass with right pleural effusion. Request is made for diagnostic and therapeutic thoracentesis. EXAM: ULTRASOUND GUIDED DIAGNOSTIC AND THERAPEUTIC THORACENTESIS MEDICATIONS: 1% lidocaine COMPLICATIONS: None immediate. PROCEDURE: An ultrasound guided thoracentesis was thoroughly discussed with the patient and questions answered. The benefits, risks, alternatives and complications were also discussed. The patient understands and wishes to proceed with the procedure. Written consent was obtained. Ultrasound was performed to localize and mark an adequate pocket of fluid in the right chest. The area was then prepped and draped in the normal sterile fashion. 1% Lidocaine was used for local anesthesia. Under ultrasound guidance a Safe-T-Centesis catheter was introduced. Thoracentesis was performed. The catheter was removed and a dressing applied. FINDINGS: A total of  approximately 1.1 L of serous fluid was removed. Samples were sent to the laboratory as requested by the clinical team. IMPRESSION: Successful ultrasound guided right thoracentesis yielding 1.1 L of pleural fluid. Read by: Saverio Danker, PA-C Electronically Signed   By: Corrie Mckusick D.O.   On: 09/09/2016 15:27     Assessment/Plan   ICD-10-CM   1. Mass of upper lobe of right lung R91.8   2. Abnormal brain MRI R90.89    33m subacute infarct vs solitary met  3. Elevated CEA R97.0   4. Chronic atrial fibrillation (HCC) I48.2   5. Chronic diastolic heart failure (HCC) I50.32   6. Essential hypertension I10   7. Other iron deficiency anemia D50.8   8. Electrolyte imbalance E87.8   9. Severe protein-calorie malnutrition ([place] E43   10. Elevated LFTs R79.89     Cont current meds as ordered  Check CBC and BMP, follow LFTs  Palliative care referral  Repeat MRI brain in 6 weeks  PT/OT/ST as ordered  Cont [place] O2 as indicated to keep O2 sat >92%  F/u with specialists as scheduled  nutritional supplements as indicated  GOAL: short term rehab and d/c home when medically appropriate. Communicated with pt and nursing.  Will follow  Gavriella Hearst S. CPerlie Gold PJohn [facility] [address] [place] [address]([phone] (Monday-Friday 8 AM - 5 PM) ([phone] 5 PM and follow prompts

## 2016-09-15 NOTE — Progress Notes (Signed)
Subjective:    Patient ID: Steven Krause, male    DOB: 20-Mar-1940, 76 y.o.   MRN: [id number]  [facility].:  Follow-up after recent hospitalization with Acute Hypoxic Respiratory Failure, Right Upper Lobe Mass, & Tobacco Use Disorder.  HPI Acute hypoxic respiratory failure:  He is continuing to use oxygen at 4 L/m constantly. Patient currently in rehab at [facility].    Right upper lobe mass:  Originally diagnosed in February 2018. However, patient failed to follow-up. Denies any hemoptysis. Denies any chest pain or pressure. Questionable cerebral metastases on MRI. Denies any headaches.   Tobacco use disorder:  He reports he has not smoked since prior to his hospitalization. He has had wheezing before. He denies any history of bronchitis. Denies any history of COPD or asthma.   Review of Systems He reports dyspnea on exertion. He does have intermittent coughing productive of a "white mucus". No fever, chills, or sweats. No abdominal pain, nausea, or emesis.   Allergies  Allergen Reactions  . Statins Nausea And Vomiting    Current Outpatient Prescriptions on File Prior to Visit  Medication Sig Dispense Refill  . amLODipine (NORVASC) 10 MG tablet Take 10 mg by mouth daily.  5  . apixaban (ELIQUIS) 5 MG TABS tablet Take 1 tablet (5 mg total) by mouth 2 (two) times daily. 60 tablet 5  . ezetimibe (ZETIA) 10 MG tablet Take 1 tablet (10 mg total) by mouth daily. 30 tablet 5  . furosemide (LASIX) 40 MG tablet TAKE 1 TABLET IN THE MORNING AND TAKE 1 TABLET IN THE EVENING 60 tablet 5  . guaiFENesin (MUCINEX) 600 MG 12 hr tablet Take 1 tablet (600 mg total) by mouth 2 (two) times daily as needed for cough or to loosen phlegm.    Marland Kitchen HYDROcodone-acetaminophen (NORCO) 5-325 MG tablet Take 1 tablet by mouth every 6 (six) hours as needed for moderate pain. 8 tablet 0  . ipratropium-albuterol (DUONEB) 0.5-2.5 (3) MG/3ML SOLN Take 3 mLs by nebulization every 4 (four) hours as needed. 360 mL 5  . iron  polysaccharides (NIFEREX) 150 MG capsule Take 1 capsule (150 mg total) by mouth daily. 30 capsule 3  . OXYGEN Inhale 4 L/min into the lungs continuous.    . pantoprazole (PROTONIX) 40 MG tablet Take 1 tablet (40 mg total) by mouth daily at 6 (six) AM. 30 tablet 3  . potassium chloride SA (K-DUR,KLOR-CON) 20 MEQ tablet Take 1 tablet (20 mEq total) by mouth daily. 90 tablet 3  . spironolactone (ALDACTONE) 25 MG tablet Take 1 tablet (25 mg total) by mouth daily. 30 tablet 5  . tamsulosin (FLOMAX) 0.4 MG CAPS capsule Take 1 capsule (0.4 mg total) by mouth daily. 30 capsule 5   No current facility-administered medications on file prior to visit.     Past Medical History:  Diagnosis Date  . Abdominal pain, generalized   . Acquired polycythemia 01/05/2011  . Allergy   . Diarrhea   . Dizziness and giddiness   . Hernia of unspecified site of abdominal cavity without mention of obstruction or gangrene   . Hypertension   . Hypopotassemia   . Iron excess 01/05/2011  . 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 Memory loss   . Other specified cardiac dysrhythmias(427.89)   . 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 Polycythemia, secondary   . Systemic hypertension   . Tobacco use disorder   . Unspecified disorder of kidney and ureter   . Unspecified disorder of skin and subcutaneous tissue   .  Unspecified hearing loss     Past Surgical History:  Procedure Laterality Date  . CHOLECYSTECTOMY    . COLONOSCOPY N/A 05/10/2016   Procedure: COLONOSCOPY;  Surgeon: Manus Gunning, MD;  Location: [place];  Service: Gastroenterology;  Laterality: N/A;  . ESOPHAGOGASTRODUODENOSCOPY N/A 05/10/2016   Procedure: ESOPHAGOGASTRODUODENOSCOPY (EGD);  Surgeon: Manus Gunning, MD;  Location: [place];  Service: Gastroenterology;  Laterality: N/A;  . GIVENS CAPSULE STUDY N/A 05/10/2016   Procedure: GIVENS CAPSULE STUDY;  Surgeon: Manus Gunning, MD;  Location: [place];  Service: Gastroenterology;  Laterality:  N/A;  . INFLAMED COLON    . IR THORACENTESIS ASP PLEURAL SPACE W/IMG GUIDE  09/09/2016  . LEFT HEART CATH AND CORONARY ANGIOGRAPHY N/A 05/06/2016   Procedure: Left Heart Cath and Coronary Angiography;  Surgeon: Peter M Martinique, MD;  Location: [place] [facility];  Service: Cardiovascular;  Laterality: N/A;  . NM MYOCAR PERF WALL MOTION  8//30/11   normal  . US ECHOCARDIOGRAPHY  11/03/09   EF 50-55%,  . VENTRAL HERNIA REPAIR      Family History  Problem Relation Age of Onset  . Colon polyps Mother 20  . Colon cancer Mother   . Heart disease Father   . Diabetes Sister   . Cancer Brother   . Diabetes Sister   . Diabetes Sister   . Heart disease Sister   . Stomach cancer Neg Hx   . Rectal cancer Neg Hx     Social History   Social History  . Marital status: Single    Spouse name: N/A  . Number of children: N/A  . Years of education: N/A   Social History Main Topics  . Smoking status: Former Smoker    Packs/day: 0.25    Years: 55.00    Types: Pipe    Quit date: 05/01/2016  . Smokeless tobacco: Never Used  . Alcohol use No  . Drug use: No  . Sexual activity: Not Currently   Other Topics Concern  . None   Social History Narrative   Friend [name] is contact and helps with his finances, appts, etc.      Objective:   Physical Exam BP 128/78 (BP Location: Right Arm, Patient Position: Sitting, Cuff Size: Normal)   Pulse 67   Ht 5\' 9"  (1.753 m)   SpO2 95% Comment: 4LPM continuous  BMI 31.31 kg/m  General:  Awake. Alert. No acute distress. Obese male. Integument:  Warm & dry. No rash on exposed skin.  Extremities:  No cyanosis or clubbing.  HEENT:  Moist mucus membranes. No oral ulcers. Poor dentition. No scleral icterus. Cardiovascular:  Regular rate. Pitting lower extremity edema. No appreciable JVD given body positioning.  Pulmonary:  Diminished breath sounds over right hemithorax with some dullness to percussion in the right lung base. Normal work of breathing on nasal  cannula oxygen. Abdomen: Soft. Normal bowel sounds. Protuberant. Musculoskeletal:  Normal bulk and tone. No joint deformity or effusion appreciated.  IMAGING MRI BRAIN W/ & W/O CONTRAST 09/10/16 (personally reviewed with patient today during visit):   IMPRESSION: 1. 6 mm subacute infarct versus solitary metastasis in the right putamen. MRI in 6 weeks should be able to differentiate. 2. Moderately motion degraded study. 3. Heterogeneous marrow in the clivus, attention on follow-up.  CT CHEST W/ CONTRAST 09/06/16 (personally reviewed by me):   IMPRESSION: 1. 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 2. Progressive masslike opacity throughout the right upper lobe with direct  extension to the right hilum on right side of the mediastinum. 3. New or progressive thoracic nodal metastasis. 4. Development of extensive hepatic and upper abdominal nodal metastasis. 5. Mild motion degradation. 6. Other areas of right upper lobe ground-glass and pulmonary opacity could represent postobstructive pneumonitis and/or lymphangitic tumor. 7. Increase in small right and similar trace left pleural effusion. 8. Incompletely imaged collateral vein about the right-side of the abdomen is nonspecific. This could be related to chronic IVC thrombus or even an incompletely imaged right renal mass. 9. Pulmonary artery enlargement suggests pulmonary arterial hypertension. 10. Coronary artery atherosclerosis  CARDIAC LHC (05/06/16):  Distal LAD-2 lesion 30% stenosis  Distal LAD-1 lesion 40% stenosis  Inferior septal lesion 90% stenosis  LVEDP mildly elevated  PATHOLOGY Sputum Cytology (09/09/16):  No malignant cells. Right Pleural Fluid (09/09/16): Reactive mesothelial cells. No malignant cells identified.    Assessment & Plan:  76 y.o. male with right upper lobe mass and findings concerning for primary lung malignancy. I reviewed his chest CT scan with him today during  his visit. His initial pleural fluid cytology was negative. However, with the increase in sensitivity to 95% I feel it would be reasonable to repeat thoracentesis before attempting hepatic biopsy. The patient is continuing to use oxygen as prescribed. I congratulated him on his tobacco abstinence & encouraged him to continue when he is discharged from rehabilitation. I instructed him to contact our office if he had any questions or concerns before his next appointment.  1. Acute hypoxic respiratory failure:  Continuing on oxygen as previously prescribed. Further titration of oxygen while at rehabilitation. 2. Right upper lobe mass: Likely malignancy. Checking PET CT scan. 3. Right pleural effusion: Repeat chest x-ray PA/LAT today. If sufficient reaccumulation plan for right-sided thoracentesis. 4. Tobacco use disorder: Continues to abstain. Holding off on pulmonary function testing at this time. 5. Follow-up: Return to clinic in 6 weeks or sooner if needed.  Sonia Baller Ashok Cordia, M.D. [facility] Pager:  [phone] After 3pm or if no response, call [phone] 3:10 PM 09/15/16

## 2016-09-15 NOTE — Patient Instructions (Addendum)
   We are doing an X-ray of your chest today to see if the fluid around your lung has come back that will allow [place] to sample it off again and determine what type of cancer you may have.  We are going to set you up for a PET CT scan that will help to tell us where this probable cancer may have spread.  We will be in touch regarding the results.  Call my office if you have any questions or concerns before your next appointment.   TESTS ORDERED: 1. CXR PA/LAT TODAY  2. PET CT Scan

## 2016-09-15 NOTE — Addendum Note (Signed)
Addended by: Tyson Dense on: 09/15/2016 03:37 PM   Modules accepted: Orders

## 2016-09-16 NOTE — Progress Notes (Signed)
ATC; received message from home phone to enter a security pin and cellphone has been disconnected on not in service. Will try home phone at later time.

## 2016-09-19 ENCOUNTER — Encounter: Payer: Self-pay | Admitting: Pulmonary Disease

## 2016-09-19 NOTE — Progress Notes (Signed)
ATC; cellphone number is non-working and unable to leave voicemail on home phone. Will try again at later time.

## 2016-09-19 NOTE — Progress Notes (Signed)
Attempted to contact patient; home phone asking for security number and cellphone is not working. As this is the third attempt, a letter is being sent to address on file.

## 2016-09-20 ENCOUNTER — Encounter: Payer: Self-pay | Admitting: Internal Medicine

## 2016-09-22 LAB — HEPATIC FUNCTION PANEL
ALK PHOS: 675 — AB (ref 25–125)
ALT: 71 — AB (ref 10–40)
AST: 299 — AB (ref 14–40)
BILIRUBIN, TOTAL: 1.3

## 2016-09-22 LAB — CBC AND DIFFERENTIAL
HEMATOCRIT: 38 — AB (ref 41–53)
Hemoglobin: 11.9 — AB (ref 13.5–17.5)
NEUTROS ABS: 7
PLATELETS: 183 (ref 150–399)
WBC: 8.5

## 2016-09-22 LAB — BASIC METABOLIC PANEL
BUN: 19 (ref 4–21)
Creatinine: 0.9 (ref 0.6–1.3)
GLUCOSE: 81
Potassium: 4.4 (ref 3.4–5.3)
SODIUM: 136 — AB (ref 137–147)

## 2016-09-26 ENCOUNTER — Non-Acute Institutional Stay (SKILLED_NURSING_FACILITY): Payer: Medicare Other | Admitting: Adult Health

## 2016-09-26 ENCOUNTER — Encounter: Payer: Self-pay | Admitting: Adult Health

## 2016-09-26 DIAGNOSIS — L03116 Cellulitis of left lower limb: Secondary | ICD-10-CM | POA: Diagnosis not present

## 2016-09-26 NOTE — Progress Notes (Signed)
Location:   [facility] Room Number: 101 A Place of Service:  SNF (31)   CODE STATUS: Full Code (Pt changed from DNR to Full Code on 09-14-16.)  Allergies  Allergen Reactions  . Statins Nausea And Vomiting    Chief Complaint  Patient presents with  . Acute Visit    Leg Swelling    HPI:  Staff report to me this morning that he has worse edema in his left leg and has edema in his right leg. Staff also reports that he has increased redness present and has increased warmth present.  There are no reports of fever present. He tells me that he is feeling good. He is less able to participate in the hpi or ros.   Past Medical History:  Diagnosis Date  . Abdominal pain, generalized   . Acquired polycythemia 01/05/2011  . Allergy   . Diarrhea   . Dizziness and giddiness   . Hernia of unspecified site of abdominal cavity without mention of obstruction or gangrene   . Hypertension   . Hypopotassemia   . Iron excess 01/05/2011  . 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 Memory loss   . Other specified cardiac dysrhythmias(427.89)   . 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 Polycythemia, secondary   . Systemic hypertension   . Tobacco use disorder   . Unspecified disorder of kidney and ureter   . Unspecified disorder of skin and subcutaneous tissue   . Unspecified hearing loss     Past Surgical History:  Procedure Laterality Date  . CHOLECYSTECTOMY    . COLONOSCOPY N/A 05/10/2016   Procedure: COLONOSCOPY;  Surgeon: Manus Gunning, MD;  Location: [facility];  Service: Gastroenterology;  Laterality: N/A;  . ESOPHAGOGASTRODUODENOSCOPY N/A 05/10/2016   Procedure: ESOPHAGOGASTRODUODENOSCOPY (EGD);  Surgeon: Manus Gunning, MD;  Location: [facility];  Service: Gastroenterology;  Laterality: N/A;  . GIVENS CAPSULE STUDY N/A 05/10/2016   Procedure: GIVENS CAPSULE STUDY;  Surgeon: Manus Gunning, MD;  Location: [facility];  Service: Gastroenterology;  Laterality: N/A;  . INFLAMED  COLON    . IR THORACENTESIS ASP PLEURAL SPACE W/IMG GUIDE  09/09/2016  . LEFT HEART CATH AND CORONARY ANGIOGRAPHY N/A 05/06/2016   Procedure: Left Heart Cath and Coronary Angiography;  Surgeon: Peter M Martinique, MD;  Location: [place] CV LAB;  Service: Cardiovascular;  Laterality: N/A;  . NM MYOCAR PERF WALL MOTION  8//30/11   normal  . US ECHOCARDIOGRAPHY  11/03/09   EF 50-55%,  . VENTRAL HERNIA REPAIR      Social History   Social History  . Marital status: Single    Spouse name: N/A  . Number of children: N/A  . Years of education: N/A   Occupational History  . Not on file.   Social History Main Topics  . Smoking status: Former Smoker    Packs/day: 0.25    Years: 55.00    Types: Pipe    Quit date: 05/01/2016  . Smokeless tobacco: Never Used  . Alcohol use No  . Drug use: No  . Sexual activity: Not Currently   Other Topics Concern  . Not on file   Social History Narrative   Friend [name] is contact and helps with his finances, appts, etc.   Family History  Problem Relation Age of Onset  . Colon polyps Mother 39  . Colon cancer Mother   . Heart disease Father   . Diabetes Sister   . Cancer Brother   . Diabetes Sister   .  Diabetes Sister   . Heart disease Sister   . Stomach cancer Neg Hx   . Rectal cancer Neg Hx       VITAL SIGNS BP 138/70   Pulse 68   Temp 97.7 F (36.5 C)   Resp 20   Ht _0  (1.753 m)   Wt 212 lb (96.2 kg)   SpO2 97%   BMI 31.31 kg/m   Patient's Medications  New Prescriptions   No medications on file  Previous Medications   AMLODIPINE (NORVASC) 10 MG TABLET    Take 10 mg by mouth daily.   APIXABAN (ELIQUIS) 5 MG TABS TABLET    Take 1 tablet (5 mg total) by mouth 2 (two) times daily.   EZETIMIBE (ZETIA) 10 MG TABLET    Take 1 tablet (10 mg total) by mouth daily.   FUROSEMIDE (LASIX) 40 MG TABLET    TAKE 1 TABLET IN THE MORNING AND TAKE 1 TABLET IN THE EVENING   GUAIFENESIN (MUCINEX) 600 MG 12 HR TABLET    Take 1 tablet (600 mg  total) by mouth 2 (two) times daily as needed for cough or to loosen phlegm.   HYDROCODONE-ACETAMINOPHEN (NORCO) 5-325 MG TABLET    Take 1 tablet by mouth every 6 (six) hours as needed for moderate pain.   IPRATROPIUM-ALBUTEROL (DUONEB) 0.5-2.5 (3) MG/3ML SOLN    Take 3 mLs by nebulization every 4 (four) hours as needed.   IRON POLYSACCHARIDES (NIFEREX) 150 MG CAPSULE    Take 1 capsule (150 mg total) by mouth daily.   OXYGEN    Inhale 4 L/min into the lungs continuous.   PANTOPRAZOLE (PROTONIX) 40 MG TABLET    Take 1 tablet (40 mg total) by mouth daily at 6 (six) AM.   POTASSIUM CHLORIDE SA (K-DUR,KLOR-CON) 20 MEQ TABLET    Take 1 tablet (20 mEq total) by mouth daily.   SPIRONOLACTONE (ALDACTONE) 25 MG TABLET    Take 1 tablet (25 mg total) by mouth daily.   TAMSULOSIN (FLOMAX) 0.4 MG CAPS CAPSULE    Take 1 capsule (0.4 mg total) by mouth daily.  Modified Medications   No medications on file  Discontinued Medications   No medications on file     SIGNIFICANT DIAGNOSTIC EXAMS  09-04-16: chest x-ray: New parenchymal consolidation in the anterior right upper lobe, suspicious for pneumonia. Followup PA and lateral chest X-ray is recommended in 3-4 weeks following trial of antibiotic therapy to ensure resolution and exclude underlying malignancy.  09-06-16: ct of chest: 1. 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 2. Progressive masslike opacity throughout the right upper lobe with direct extension to the right hilum on right side of the mediastinum. 3. New or progressive thoracic nodal metastasis. 4. Development of extensive hepatic and upper abdominal nodal metastasis. 5. Mild motion degradation. 6. Other areas of right upper lobe ground-glass and pulmonary opacity could represent postobstructive pneumonitis and/or lymphangitic tumor. 7. Increase in small right and similar trace left pleural effusion. 8. Incompletely imaged collateral vein about the  right-side of the abdomen is nonspecific. This could be related to chronic IVC thrombus or even an incompletely imaged right renal mass. 9. Pulmonary artery enlargement suggests pulmonary arterial hypertension. 10. Coronary artery atherosclerosis. Aortic Atherosclerosis  09-09-16: thoracentesis: A total of approximately 1.1 L of serous fluid was removed  09-09-16: chest x-ray: . No pneumothorax post right thoracentesis.   09-10-16: mri of brain: 1. 6 mm subacute infarct versus solitary metastasis in the right  putamen. MRI in 6 weeks should be able to differentiate. 2. Moderately motion degraded study. 3. Heterogeneous marrow in the clivus, attention on follow-up.  NO NEW EXAMS   LABS REVIEWED   09-04-16: wbc 79; hgb 11.5; hct 37.5; mcv 81.0; plt 263; glucose 117; bun 13; creat 0.96; k+ 3.3; na++ 133; ca 8.9 blood culture: no growth: urine culture: <10,000 colonies 09-05-16: wb 7.9; hgb 10.4; hct 34.6; mcv 81.8; plt 259; glucose 139; bun 12; creat 0.92; k+ 3.2 ;na++ 134; ca 8.1; ast 134; alk phos 228; albumin 2.7 09-06-16: CEA 91.8; 09-08-16: BNP 290.2 09-12-16: glucose 97;bun 16; creat 0.81; k+ 3.8; na++ 136; ca 8.1  NO NEW LABS    Review of Systems  Constitutional: Negative for malaise/fatigue.  Respiratory: Positive for shortness of breath. Negative for cough.        Wears 02   Cardiovascular: Positive for leg swelling. Negative for chest pain and palpitations.  Gastrointestinal: Negative for abdominal pain, constipation and heartburn.  Musculoskeletal: Negative for back pain and joint pain.  Skin: Negative.   Neurological: Negative for dizziness.  Psychiatric/Behavioral: The patient is not nervous/anxious.      Physical Exam  Constitutional: No distress.  Eyes: Conjunctivae are normal.  Neck: Neck supple. No JVD present. No thyromegaly present.  Cardiovascular: Normal rate and intact distal pulses.   Heart rate irregular irregular   Respiratory: Effort normal and breath sounds normal. No  respiratory distress. He has no wheezes.  GI: Soft. Bowel sounds are normal. He exhibits no distension. There is no tenderness.  Musculoskeletal: He exhibits edema.  Able to move all extremities  Has left lower extremity edema 3-4+ Right lower extremity edema 2-3+  Lymphadenopathy:    He has no cervical adenopathy.  Neurological: He is alert.  Skin: Skin is warm and dry. He is not diaphoretic.  Left lower leg is red hot to touch He does have open area to his left lower leg   Psychiatric: He has a normal mood and affect.    ASSESSMENT/ PLAN:  1.  Left lower leg cellulitis: will begin doxycycline 100 mg twice daily for 2 weeks with florastor Will get doppler to ensure there is not a dvt present.      Ok Edwards NP [facility]  Contact [phone] Monday through Friday 8am- 5pm  After hours call [phone]

## 2016-09-27 ENCOUNTER — Encounter: Payer: Self-pay | Admitting: Adult Health

## 2016-09-27 ENCOUNTER — Non-Acute Institutional Stay (SKILLED_NURSING_FACILITY): Payer: Medicare Other | Admitting: Adult Health

## 2016-09-27 DIAGNOSIS — I10 Essential (primary) hypertension: Secondary | ICD-10-CM | POA: Diagnosis not present

## 2016-09-27 DIAGNOSIS — L03116 Cellulitis of left lower limb: Secondary | ICD-10-CM

## 2016-09-27 DIAGNOSIS — I5032 Chronic diastolic (congestive) heart failure: Secondary | ICD-10-CM

## 2016-09-27 NOTE — Progress Notes (Signed)
Location:   [facility] Room Number: 101 A Place of Service:  SNF (31)   CODE STATUS: Full Code  Allergies  Allergen Reactions  . Statins Nausea And Vomiting    Chief Complaint  Patient presents with  . Acute Visit    Legs Weeping    HPI:  Staff reports that beginning last evening his legs began weeping. He denies pain this morning. He is falling asleep easier. He is drooling. There are no reports of fever present. He cannot fully participate in the hpi or ros. His lower left leg remains in flamed. He is on doxycycline for cellulitis.   Past Medical History:  Diagnosis Date  . Abdominal pain, generalized   . Acquired polycythemia 01/05/2011  . Allergy   . Diarrhea   . Dizziness and giddiness   . Hernia of unspecified site of abdominal cavity without mention of obstruction or gangrene   . Hypertension   . Hypopotassemia   . Iron excess 01/05/2011  . 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 Memory loss   . Other specified cardiac dysrhythmias(427.89)   . 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 Polycythemia, secondary   . Systemic hypertension   . Tobacco use disorder   . Unspecified disorder of kidney and ureter   . Unspecified disorder of skin and subcutaneous tissue   . Unspecified hearing loss     Past Surgical History:  Procedure Laterality Date  . CHOLECYSTECTOMY    . COLONOSCOPY N/A 05/10/2016   Procedure: COLONOSCOPY;  Surgeon: Manus Gunning, MD;  Location: [facility];  Service: Gastroenterology;  Laterality: N/A;  . ESOPHAGOGASTRODUODENOSCOPY N/A 05/10/2016   Procedure: ESOPHAGOGASTRODUODENOSCOPY (EGD);  Surgeon: Manus Gunning, MD;  Location: [facility];  Service: Gastroenterology;  Laterality: N/A;  . GIVENS CAPSULE STUDY N/A 05/10/2016   Procedure: GIVENS CAPSULE STUDY;  Surgeon: Manus Gunning, MD;  Location: [facility];  Service: Gastroenterology;  Laterality: N/A;  . INFLAMED COLON    . IR THORACENTESIS ASP PLEURAL SPACE W/IMG GUIDE  09/09/2016    . LEFT HEART CATH AND CORONARY ANGIOGRAPHY N/A 05/06/2016   Procedure: Left Heart Cath and Coronary Angiography;  Surgeon: Peter M Martinique, MD;  Location: [facility] CV LAB;  Service: Cardiovascular;  Laterality: N/A;  . NM MYOCAR PERF WALL MOTION  8//30/11   normal  . US ECHOCARDIOGRAPHY  11/03/09   EF 50-55%,  . VENTRAL HERNIA REPAIR      Social History   Social History  . Marital status: Single    Spouse name: N/A  . Number of children: N/A  . Years of education: N/A   Occupational History  . Not on file.   Social History Main Topics  . Smoking status: Former Smoker    Packs/day: 0.25    Years: 55.00    Types: Pipe    Quit date: 05/01/2016  . Smokeless tobacco: Never Used  . Alcohol use No  . Drug use: No  . Sexual activity: Not Currently   Other Topics Concern  . Not on file   Social History Narrative   Friend [name] is contact and helps with his finances, appts, etc.   Family History  Problem Relation Age of Onset  . Colon polyps Mother 70  . Colon cancer Mother   . Heart disease Father   . Diabetes Sister   . Cancer Brother   . Diabetes Sister   . Diabetes Sister   . Heart disease Sister   . Stomach cancer Neg Hx   .  Rectal cancer Neg Hx       VITAL SIGNS BP 138/70   Pulse 68   Temp 97.7 F (36.5 C)   Resp 20   Ht _0  (1.753 m)   Wt 206 lb 9.6 oz (93.7 kg)   SpO2 92%   BMI 30.51 kg/m   Patient's Medications  New Prescriptions   No medications on file  Previous Medications   AMLODIPINE (NORVASC) 10 MG TABLET    Take 10 mg by mouth daily.   APIXABAN (ELIQUIS) 5 MG TABS TABLET    Take 1 tablet (5 mg total) by mouth 2 (two) times daily.   DOXYCYCLINE (DORYX) 100 MG EC TABLET    Take 100 mg by mouth 2 (two) times daily.   EZETIMIBE (ZETIA) 10 MG TABLET    Take 1 tablet (10 mg total) by mouth daily.   FUROSEMIDE (LASIX) 40 MG TABLET    TAKE 1 TABLET IN THE MORNING AND TAKE 1 TABLET IN THE EVENING   GUAIFENESIN (MUCINEX) 600 MG 12 HR TABLET     Take 1 tablet (600 mg total) by mouth 2 (two) times daily as needed for cough or to loosen phlegm.   HYDROCODONE-ACETAMINOPHEN (NORCO) 5-325 MG TABLET    Take 1 tablet by mouth every 6 (six) hours as needed for moderate pain.   IPRATROPIUM-ALBUTEROL (DUONEB) 0.5-2.5 (3) MG/3ML SOLN    Take 3 mLs by nebulization 3 (three) times daily.   IRON POLYSACCHARIDES (NIFEREX) 150 MG CAPSULE    Take 1 capsule (150 mg total) by mouth daily.   LACTOBACILLUS (ACIDOPHILUS) CAPS CAPSULE    Take 1 capsule by mouth 2 (two) times daily. For 3 weeks   OXYGEN    Inhale 4 L/min into the lungs continuous.   PANTOPRAZOLE (PROTONIX) 40 MG TABLET    Take 1 tablet (40 mg total) by mouth daily at 6 (six) AM.   POTASSIUM CHLORIDE SA (K-DUR,KLOR-CON) 20 MEQ TABLET    Take 1 tablet (20 mEq total) by mouth daily.   SPIRONOLACTONE (ALDACTONE) 25 MG TABLET    Take 1 tablet (25 mg total) by mouth daily.   TAMSULOSIN (FLOMAX) 0.4 MG CAPS CAPSULE    Take 1 capsule (0.4 mg total) by mouth daily.  Modified Medications   No medications on file  Discontinued Medications   IPRATROPIUM-ALBUTEROL (DUONEB) 0.5-2.5 (3) MG/3ML SOLN    Take 3 mLs by nebulization every 4 (four) hours as needed.     SIGNIFICANT DIAGNOSTIC EXAMS PREVIOUS  09-04-16: chest x-ray: New parenchymal consolidation in the anterior right upper lobe, suspicious for pneumonia. Followup PA and lateral chest X-ray is recommended in 3-4 weeks following trial of antibiotic therapy to ensure resolution and exclude underlying malignancy.  09-06-16: ct of chest: 1. 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 2. Progressive masslike opacity throughout the right upper lobe with direct extension to the right hilum on right side of the mediastinum. 3. New or progressive thoracic nodal metastasis. 4. Development of extensive hepatic and upper abdominal nodal metastasis. 5. Mild motion degradation. 6. Other areas of right upper lobe  ground-glass and pulmonary opacity could represent postobstructive pneumonitis and/or lymphangitic tumor. 7. Increase in small right and similar trace left pleural effusion. 8. Incompletely imaged collateral vein about the right-side of the abdomen is nonspecific. This could be related to chronic IVC thrombus or even an incompletely imaged right renal mass. 9. Pulmonary artery enlargement suggests pulmonary arterial hypertension. 10. Coronary artery atherosclerosis. Aortic Atherosclerosis  09-09-16: thoracentesis: A total of approximately 1.1 L of serous fluid was removed  09-09-16: chest x-ray: . No pneumothorax post right thoracentesis.   09-10-16: mri of brain: 1. 6 mm subacute infarct versus solitary metastasis in the right putamen. MRI in 6 weeks should be able to differentiate. 2. Moderately motion degraded study. 3. Heterogeneous marrow in the clivus, attention on follow-up.  REVIEWED TODAY:   09-27-16: left lower extremity venous doppler: neg for dvt    LABS REVIEWED   09-04-16: wbc 79; hgb 11.5; hct 37.5; mcv 81.0; plt 263; glucose 117; bun 13; creat 0.96; k+ 3.3; na++ 133; ca 8.9 blood culture: no growth: urine culture: <10,000 colonies 09-05-16: wb 7.9; hgb 10.4; hct 34.6; mcv 81.8; plt 259; glucose 139; bun 12; creat 0.92; k+ 3.2 ;na++ 134; ca 8.1; ast 134; alk phos 228; albumin 2.7 09-06-16: CEA 91.8; 09-08-16: BNP 290.2 09-12-16: glucose 97;bun 16; creat 0.81; k+ 3.8; na++ 136; ca 8.1  REVIEWED TODAY  09-22-16: wbc 8.5; hgb 11.9; hct 37.9; mcv 82.5; plt 183; glucose 81; bun 21.8; creat 0.87; k+ 4.4; na++ 136; ca 9.2; alt 72 ast 312; alk phos 675; total bili 1.3; albumin 3.1    Review of Systems  Unable to perform ROS: Medical condition (he is unable to answer questions )   Physical Exam  Constitutional: No distress.  Eyes: Conjunctivae are normal.  Neck: Neck supple. No JVD present. No thyromegaly present.  Cardiovascular: Normal rate and intact distal pulses.   Heart rate  irregular irregular    Respiratory: Effort normal and breath sounds normal. No respiratory distress. He has no wheezes.  GI: Soft. Bowel sounds are normal. He exhibits no distension. There is no tenderness.  Musculoskeletal: He exhibits edema.  Able to move all extremities  Has left lower extremity edema 3-4+ Right lower extremity edema 2-3+  Has weeping present   Lymphadenopathy:    He has no cervical adenopathy.  Neurological: He is alert.  Skin: Skin is warm and dry. He is not diaphoretic.  Left lower leg is red hot to touch He does have open area to his left lower leg   Does have ace wraps in place   Psychiatric: He has a normal mood and affect.      ASSESSMENT/ PLAN:  1.  Left lower leg cellulitis: is without change will continue   doxycycline 100 mg twice daily  with florastor; his doppler was negative for dvt   Is is important to note that his albumin is 3.1  this could very well be playing a role in his weeping    2. chronic diastolic heart failure: EF 60-65% (05-05-16) will continue lasix 40 mg twice daily with k+ 20 meq daily will increase spironolactone to 25 mg twice daily   3. Hypertension: b/p 138/70: will stop norvasc due to edema; will begin clonidine 0.1 mg twice daily will increase spironolactone to 25 mg twice daily   Will repeat bmp in one week Will have nursing check blood pressure twice daily for one week and report.      Ok Edwards NP [facility]  Contact [phone] Monday through Friday 8am- 5pm  After hours call [phone]

## 2016-09-28 ENCOUNTER — Encounter (HOSPITAL_COMMUNITY): Payer: Medicare Other

## 2016-09-28 ENCOUNTER — Encounter: Payer: Self-pay | Admitting: Adult Health

## 2016-09-28 ENCOUNTER — Non-Acute Institutional Stay (SKILLED_NURSING_FACILITY): Payer: Medicare Other | Admitting: Adult Health

## 2016-09-28 DIAGNOSIS — L03116 Cellulitis of left lower limb: Secondary | ICD-10-CM | POA: Insufficient documentation

## 2016-09-28 DIAGNOSIS — I482 Chronic atrial fibrillation, unspecified: Secondary | ICD-10-CM

## 2016-09-28 DIAGNOSIS — I5032 Chronic diastolic (congestive) heart failure: Secondary | ICD-10-CM | POA: Diagnosis not present

## 2016-09-28 DIAGNOSIS — J9611 Chronic respiratory failure with hypoxia: Secondary | ICD-10-CM | POA: Diagnosis not present

## 2016-09-28 NOTE — Progress Notes (Signed)
Location:   [facility] Room Number: 101 A Place of Service:  SNF (31)   CODE STATUS: Full Code  Allergies  Allergen Reactions  . Statins Nausea And Vomiting    Chief Complaint  Patient presents with  . Acute Visit    Care Plan Meeting    HPI:  We are meeting for his routine care plan meeting, the care plan team is present along with the patient and family. The concerns today include his drooling; which is an indication of dysphagia; he is coughing with food. He is falling asleep during the meeting staff reports that he does fall asleep easily. At this time; his goals for discharge are for assisted living; as he is unable to return home by himself. His family does understand that more than likely he will need long term SNF placement.   Past Medical History:  Diagnosis Date  . Abdominal pain, generalized   . Acquired polycythemia 01/05/2011  . Allergy   . Diarrhea   . Dizziness and giddiness   . Hernia of unspecified site of abdominal cavity without mention of obstruction or gangrene   . Hypertension   . Hypopotassemia   . Iron excess 01/05/2011  . 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 Memory loss   . Other specified cardiac dysrhythmias(427.89)   . 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 Polycythemia, secondary   . Systemic hypertension   . Tobacco use disorder   . Unspecified disorder of kidney and ureter   . Unspecified disorder of skin and subcutaneous tissue   . Unspecified hearing loss     Past Surgical History:  Procedure Laterality Date  . CHOLECYSTECTOMY    . COLONOSCOPY N/A 05/10/2016   Procedure: COLONOSCOPY;  Surgeon: Manus Gunning, MD;  Location: [facility];  Service: Gastroenterology;  Laterality: N/A;  . ESOPHAGOGASTRODUODENOSCOPY N/A 05/10/2016   Procedure: ESOPHAGOGASTRODUODENOSCOPY (EGD);  Surgeon: Manus Gunning, MD;  Location: [facility];  Service: Gastroenterology;  Laterality: N/A;  . GIVENS CAPSULE STUDY N/A 05/10/2016   Procedure: GIVENS  CAPSULE STUDY;  Surgeon: Manus Gunning, MD;  Location: [place];  Service: Gastroenterology;  Laterality: N/A;  . INFLAMED COLON    . IR THORACENTESIS ASP PLEURAL SPACE W/IMG GUIDE  09/09/2016  . LEFT HEART CATH AND CORONARY ANGIOGRAPHY N/A 05/06/2016   Procedure: Left Heart Cath and Coronary Angiography;  Surgeon: Peter M Martinique, MD;  Location: [facility];  Service: Cardiovascular;  Laterality: N/A;  . NM MYOCAR PERF WALL MOTION  8//30/11   normal  . US ECHOCARDIOGRAPHY  11/03/09   EF 50-55%,  . VENTRAL HERNIA REPAIR      Social History   Social History  . Marital status: Single    Spouse name: N/A  . Number of children: N/A  . Years of education: N/A   Occupational History  . Not on file.   Social History Main Topics  . Smoking status: Former Smoker    Packs/day: 0.25    Years: 55.00    Types: Pipe    Quit date: 05/01/2016  . Smokeless tobacco: Never Used  . Alcohol use No  . Drug use: No  . Sexual activity: Not Currently   Other Topics Concern  . Not on file   Social History Narrative   Friend [name] is contact and helps with his finances, appts, etc.   Family History  Problem Relation Age of Onset  . Colon polyps Mother 30  . Colon cancer Mother   . Heart disease Father   .  Diabetes Sister   . Cancer Brother   . Diabetes Sister   . Diabetes Sister   . Heart disease Sister   . Stomach cancer Neg Hx   . Rectal cancer Neg Hx       VITAL SIGNS BP 140/72   Pulse 68   Temp 97.7 F (36.5 C)   Resp 18   Ht 5' 9"  (1.753 m)   Wt 206 lb 9.6 oz (93.7 kg)   SpO2 96%   BMI 30.51 kg/m    Patient's Medications  New Prescriptions   No medications on file  Previous Medications   APIXABAN (ELIQUIS) 5 MG TABS TABLET    Take 1 tablet (5 mg total) by mouth 2 (two) times daily.   CLONIDINE (CATAPRES) 0.1 MG TABLET    Take 0.1 mg by mouth 2 (two) times daily.   DOXYCYCLINE (DORYX) 100 MG EC TABLET    Take 100 mg by mouth 2 (two) times daily.    EZETIMIBE (ZETIA) 10 MG TABLET    Take 1 tablet (10 mg total) by mouth daily.   FUROSEMIDE (LASIX) 40 MG TABLET    TAKE 1 TABLET IN THE MORNING AND TAKE 1 TABLET IN THE EVENING   GUAIFENESIN (MUCINEX) 600 MG 12 HR TABLET    Take 1 tablet (600 mg total) by mouth 2 (two) times daily as needed for cough or to loosen phlegm.   HYDROCODONE-ACETAMINOPHEN (NORCO) 5-325 MG TABLET    Take 1 tablet by mouth every 6 (six) hours as needed for moderate pain.   IPRATROPIUM-ALBUTEROL (DUONEB) 0.5-2.5 (3) MG/3ML SOLN    Take 3 mLs by nebulization 3 (three) times daily.   IRON POLYSACCHARIDES (NIFEREX) 150 MG CAPSULE    Take 1 capsule (150 mg total) by mouth daily.   LACTOBACILLUS (ACIDOPHILUS) CAPS CAPSULE    Take 1 capsule by mouth 2 (two) times daily. For 3 weeks   OXYGEN    Inhale 4 L/min into the lungs continuous.   PANTOPRAZOLE (PROTONIX) 40 MG TABLET    Take 1 tablet (40 mg total) by mouth daily at 6 (six) AM.   POTASSIUM CHLORIDE SA (K-DUR,KLOR-CON) 20 MEQ TABLET    Take 1 tablet (20 mEq total) by mouth daily.   SPIRONOLACTONE (ALDACTONE) 25 MG TABLET    Take 1 tablet (25 mg total) by mouth daily.   TAMSULOSIN (FLOMAX) 0.4 MG CAPS CAPSULE    Take 1 capsule (0.4 mg total) by mouth daily.  Modified Medications   No medications on file  Discontinued Medications   AMLODIPINE (NORVASC) 10 MG TABLET    Take 10 mg by mouth daily.     SIGNIFICANT DIAGNOSTIC EXAMS  PREVIOUS   09-04-16: chest x-ray: New parenchymal consolidation in the anterior right upper lobe, suspicious for pneumonia. Followup PA and lateral chest X-ray is recommended in 3-4 weeks following trial of antibiotic therapy to ensure resolution and exclude underlying malignancy.  09-06-16: ct of chest: 1. 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 2. Progressive masslike opacity throughout the right upper lobe with direct extension to the right hilum on right side of the mediastinum. 3. New or  progressive thoracic nodal metastasis. 4. Development of extensive hepatic and upper abdominal nodal metastasis. 5. Mild motion degradation. 6. Other areas of right upper lobe ground-glass and pulmonary opacity could represent postobstructive pneumonitis and/or lymphangitic tumor. 7. Increase in small right and similar trace left pleural effusion. 8. Incompletely imaged collateral vein about the right-side of the abdomen  is nonspecific. This could be related to chronic IVC thrombus or even an incompletely imaged right renal mass. 9. Pulmonary artery enlargement suggests pulmonary arterial hypertension. 10. Coronary artery atherosclerosis. Aortic Atherosclerosis  09-09-16: thoracentesis: A total of approximately 1.1 L of serous fluid was removed  09-09-16: chest x-ray: . No pneumothorax post right thoracentesis.   09-10-16: mri of brain: 1. 6 mm subacute infarct versus solitary metastasis in the right putamen. MRI in 6 weeks should be able to differentiate. 2. Moderately motion degraded study. 3. Heterogeneous marrow in the clivus, attention on follow-up.  NO NEW EXAMS   LABS REVIEWED PREVIOUS    09-04-16: wbc 79; hgb 11.5; hct 37.5; mcv 81.0; plt 263; glucose 117; bun 13; creat 0.96; k+ 3.3; na++ 133; ca 8.9 blood culture: no growth: urine culture: <10,000 colonies 09-05-16: wb 7.9; hgb 10.4; hct 34.6; mcv 81.8; plt 259; glucose 139; bun 12; creat 0.92; k+ 3.2 ;na++ 134; ca 8.1; ast 134; alk phos 228; albumin 2.7 09-06-16: CEA 91.8; 09-08-16: BNP 290.2 09-12-16: glucose 97;bun 16; creat 0.81; k+ 3.8; na++ 136; ca 8.1  NO NEW LABS    Review of Systems  Unable to perform ROS: Other (is lethargic )    Physical Exam  Constitutional: No distress.  Eyes: Conjunctivae are normal.  Neck: Neck supple. No JVD present. No thyromegaly present.  Cardiovascular: Normal rate and intact distal pulses.   Heart rate irregular   Respiratory: Effort normal and breath sounds normal. No respiratory distress. He has  no wheezes.  GI: Soft. Bowel sounds are normal. He exhibits no distension. There is no tenderness.  Musculoskeletal: He exhibits edema.  Able to move all extremities  Has left lower extremity edema 3+ Right lower extremity edema 3+    Lymphadenopathy:    He has no cervical adenopathy.  Neurological: He is alert.  Skin: Skin is warm and dry. He is not diaphoretic.  Dressings intact   Psychiatric: He has a normal mood and affect.     ASSESSMENT/ PLAN:  TODAY  1. Acute on chronic diastolic heart failure: EF 60-65% (05-05-16) no change  will continue lasix 40 mg twice daily with k+ 20 meq daily will continue aldactone 25 mg daily   2. Afib: heart rate stable; is stable will continue eliquis 5 mg twice daily   3. Respiratory failure with hypoxia: no change in status  is on 02 at 4/L. Will continue duoneb every 4 hours as needed   Time spent with care plan meeting 40 minutes: education to family involving disease process; future placement options for possible discharge; and expectations for his care.    MD is aware of resident's narcotic use and is in agreement with current plan of care. We will attempt to wean resident as apropriate   Ok Edwards NP [facility]  Contact [phone] Monday through Friday 8am- 5pm  After hours call [phone]

## 2016-10-07 ENCOUNTER — Non-Acute Institutional Stay (SKILLED_NURSING_FACILITY): Payer: Medicare Other | Admitting: Adult Health

## 2016-10-07 ENCOUNTER — Encounter: Payer: Self-pay | Admitting: Adult Health

## 2016-10-07 DIAGNOSIS — J9611 Chronic respiratory failure with hypoxia: Secondary | ICD-10-CM | POA: Insufficient documentation

## 2016-10-07 DIAGNOSIS — K7689 Other specified diseases of liver: Secondary | ICD-10-CM

## 2016-10-07 DIAGNOSIS — L89302 Pressure ulcer of unspecified buttock, stage 2: Secondary | ICD-10-CM

## 2016-10-07 DIAGNOSIS — R945 Abnormal results of liver function studies: Secondary | ICD-10-CM

## 2016-10-07 NOTE — Progress Notes (Signed)
Location:   [facility] Room Number: 101 A Place of Service:  SNF (31)   CODE STATUS: Full Code  Allergies  Allergen Reactions  . Statins Nausea And Vomiting    Chief Complaint  Patient presents with  . Acute Visit    Wound on buttocks    HPI:  Nursing staff has asked to me see the open areas on his buttocks.  There are no indications of pain present. The areas are superificial but are bleeding. Will need skin barrier with zinc to protect his skin. His liver function is worse; will setup a liver ultrasound.   Past Medical History:  Diagnosis Date  . Abdominal pain, generalized   . Acquired polycythemia 01/05/2011  . Allergy   . Diarrhea   . Dizziness and giddiness   . Hernia of unspecified site of abdominal cavity without mention of obstruction or gangrene   . Hypertension   . Hypopotassemia   . Iron excess 01/05/2011  . 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 Memory loss   . Other specified cardiac dysrhythmias(427.89)   . 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 Polycythemia, secondary   . Systemic hypertension   . Tobacco use disorder   . Unspecified disorder of kidney and ureter   . Unspecified disorder of skin and subcutaneous tissue   . Unspecified hearing loss     Past Surgical History:  Procedure Laterality Date  . CHOLECYSTECTOMY    . COLONOSCOPY N/A 05/10/2016   Procedure: COLONOSCOPY;  Surgeon: Manus Gunning, MD;  Location: [facility];  Service: Gastroenterology;  Laterality: N/A;  . ESOPHAGOGASTRODUODENOSCOPY N/A 05/10/2016   Procedure: ESOPHAGOGASTRODUODENOSCOPY (EGD);  Surgeon: Manus Gunning, MD;  Location: [place];  Service: Gastroenterology;  Laterality: N/A;  . GIVENS CAPSULE STUDY N/A 05/10/2016   Procedure: GIVENS CAPSULE STUDY;  Surgeon: Manus Gunning, MD;  Location: [place];  Service: Gastroenterology;  Laterality: N/A;  . INFLAMED COLON    . IR THORACENTESIS ASP PLEURAL SPACE W/IMG GUIDE  09/09/2016  . LEFT HEART CATH AND  CORONARY ANGIOGRAPHY N/A 05/06/2016   Procedure: Left Heart Cath and Coronary Angiography;  Surgeon: Peter M Martinique, MD;  Location: [facility];  Service: Cardiovascular;  Laterality: N/A;  . NM MYOCAR PERF WALL MOTION  8//30/11   normal  . US ECHOCARDIOGRAPHY  11/03/09   EF 50-55%,  . VENTRAL HERNIA REPAIR      Social History   Social History  . Marital status: Single    Spouse name: N/A  . Number of children: N/A  . Years of education: N/A   Occupational History  . Not on file.   Social History Main Topics  . Smoking status: Former Smoker    Packs/day: 0.25    Years: 55.00    Types: Pipe    Quit date: 05/01/2016  . Smokeless tobacco: Never Used  . Alcohol use No  . Drug use: No  . Sexual activity: Not Currently   Other Topics Concern  . Not on file   Social History Narrative   Friend [name] is contact and helps with his finances, appts, etc.   Family History  Problem Relation Age of Onset  . Colon polyps Mother 16  . Colon cancer Mother   . Heart disease Father   . Diabetes Sister   . Cancer Brother   . Diabetes Sister   . Diabetes Sister   . Heart disease Sister   . Stomach cancer Neg Hx   . Rectal cancer Neg Hx  VITAL SIGNS BP 115/65   Pulse 90   Temp 97.6 F (36.4 C)   Resp 18   Ht 5' 9"  (1.753 m)   Wt 197 lb 12.8 oz (89.7 kg)   SpO2 93%   BMI 29.21 kg/m   Patient's Medications  New Prescriptions   No medications on file  Previous Medications   APIXABAN (ELIQUIS) 5 MG TABS TABLET    Take 1 tablet (5 mg total) by mouth 2 (two) times daily.   CLONIDINE (CATAPRES) 0.1 MG TABLET    Take 0.1 mg by mouth 2 (two) times daily.   DOXYCYCLINE (DORYX) 100 MG EC TABLET    Take 100 mg by mouth 2 (two) times daily.   EZETIMIBE (ZETIA) 10 MG TABLET    Take 1 tablet (10 mg total) by mouth daily.   FUROSEMIDE (LASIX) 40 MG TABLET    TAKE 1 TABLET IN THE MORNING AND TAKE 1 TABLET IN THE EVENING   GUAIFENESIN (MUCINEX) 600 MG 12 HR TABLET    Take 1  tablet (600 mg total) by mouth 2 (two) times daily as needed for cough or to loosen phlegm.   HYDROCODONE-ACETAMINOPHEN (NORCO) 5-325 MG TABLET    Take 1 tablet by mouth every 6 (six) hours as needed for moderate pain.   IPRATROPIUM-ALBUTEROL (DUONEB) 0.5-2.5 (3) MG/3ML SOLN    Take 3 mLs by nebulization 3 (three) times daily. And every 4 hours as needed   IRON POLYSACCHARIDES (NIFEREX) 150 MG CAPSULE    Take 1 capsule (150 mg total) by mouth daily.   LACTOBACILLUS (ACIDOPHILUS) CAPS CAPSULE    Take 1 capsule by mouth 2 (two) times daily. For 3 weeks   OXYGEN    Inhale 4 L/min into the lungs continuous.   PANTOPRAZOLE (PROTONIX) 40 MG TABLET    Take 1 tablet (40 mg total) by mouth daily at 6 (six) AM.   POTASSIUM CHLORIDE SA (K-DUR,KLOR-CON) 20 MEQ TABLET    Take 1 tablet (20 mEq total) by mouth daily.   PROMETHAZINE (PHENERGAN) 25 MG/ML INJECTION    Inject 25 mg into the muscle every 6 (six) hours as needed for nausea or vomiting.   SPIRONOLACTONE (ALDACTONE) 25 MG TABLET    Take 25 mg by mouth 2 (two) times daily.   TAMSULOSIN (FLOMAX) 0.4 MG CAPS CAPSULE    Take 1 capsule (0.4 mg total) by mouth daily.  Modified Medications   No medications on file  Discontinued Medications   No medications on file     SIGNIFICANT DIAGNOSTIC EXAMS   PREVIOUS   09-04-16: chest x-ray: New parenchymal consolidation in the anterior right upper lobe, suspicious for pneumonia. Followup PA and lateral chest X-ray is recommended in 3-4 weeks following trial of antibiotic therapy to ensure resolution and exclude underlying malignancy.  09-06-16: ct of chest: 1. 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 2. Progressive masslike opacity throughout the right upper lobe with direct extension to the right hilum on right side of the mediastinum. 3. New or progressive thoracic nodal metastasis. 4. Development of extensive hepatic and upper abdominal nodal metastasis. 5.  Mild motion degradation. 6. Other areas of right upper lobe ground-glass and pulmonary opacity could represent postobstructive pneumonitis and/or lymphangitic tumor. 7. Increase in small right and similar trace left pleural effusion. 8. Incompletely imaged collateral vein about the right-side of the abdomen is nonspecific. This could be related to chronic IVC thrombus or even an incompletely imaged right renal mass. 9. Pulmonary artery  enlargement suggests pulmonary arterial hypertension. 10. Coronary artery atherosclerosis. Aortic Atherosclerosis  09-09-16: thoracentesis: A total of approximately 1.1 L of serous fluid was removed  09-09-16: chest x-ray: . No pneumothorax post right thoracentesis.   09-10-16: mri of brain: 1. 6 mm subacute infarct versus solitary metastasis in the right putamen. MRI in 6 weeks should be able to differentiate. 2. Moderately motion degraded study. 3. Heterogeneous marrow in the clivus, attention on follow-up.  TODAY  09-27-16: venous doppler left lower extremity: no dvt   LABS REVIEWED PREVIOUS    09-04-16: wbc 79; hgb 11.5; hct 37.5; mcv 81.0; plt 263; glucose 117; bun 13; creat 0.96; k+ 3.3; na++ 133; ca 8.9 blood culture: no growth: urine culture: <10,000 colonies 09-05-16: wb 7.9; hgb 10.4; hct 34.6; mcv 81.8; plt 259; glucose 139; bun 12; creat 0.92; k+ 3.2 ;na++ 134; ca 8.1; ast 134; alk phos 228; albumin 2.7 09-06-16: CEA 91.8; 09-08-16: BNP 290.2 09-12-16: glucose 97;bun 16; creat 0.81; k+ 3.8; na++ 136; ca 8.1  TODAY:   09-22-16: wbc 8.5; hgb 11.9; hct 37.9; mcv 82.5; plt 183; glucose 81; bun 19.0; creat 0.87; k+ 4.4; na++ 136; ca 9.2; alt 71; ast 312; alk phos 675; total bili: 1.3; albumin 3.2       Review of Systems  Unable to perform ROS: Other (confused )    Physical Exam  Constitutional: No distress.  Eyes: Conjunctivae are normal.  Neck: Neck supple. No JVD present. No thyromegaly present.  Cardiovascular: Normal rate, regular rhythm and intact  distal pulses.   Respiratory: Effort normal. No respiratory distress. He has no wheezes.  Breath sounds diminished Is 02 dependent   GI: Soft. Bowel sounds are normal. He exhibits no distension. There is no tenderness.  Musculoskeletal: He exhibits edema.  Able to move all extremities  2+ lower extremity edema   Lymphadenopathy:    He has no cervical adenopathy.  Neurological: He is alert.  Skin: Skin is warm and dry. He is not diaphoretic.  Buttocks with stage II areas with bleeding present. No signs of infection present will need zinc oxide oint.   Left lower leg is less inflamed   Psychiatric: He has a normal mood and affect.    ASSESSMENT/ PLAN:  1. Stage II areas on buttock due to sheering and exposure to urine: will use skin barrier with zinc and will monitor  2. Worsening abnormal liver function: will setup liver ultrasound. And will monitor his status.   MD is aware of resident's narcotic use and is in agreement with current plan of care. We will attempt to wean resident as apropriate   Ok Edwards NP [facility]  Contact [phone] Monday through Friday 8am- 5pm  After hours call [phone]

## 2016-10-10 ENCOUNTER — Encounter: Payer: Self-pay | Admitting: Adult Health

## 2016-10-10 ENCOUNTER — Non-Acute Institutional Stay (SKILLED_NURSING_FACILITY): Payer: Medicare Other | Admitting: Adult Health

## 2016-10-10 DIAGNOSIS — R112 Nausea with vomiting, unspecified: Secondary | ICD-10-CM | POA: Diagnosis not present

## 2016-10-10 NOTE — Progress Notes (Signed)
Location:   [facility] Room Number: 101 A Place of Service:  SNF (31)   CODE STATUS: Full Code  Allergies  Allergen Reactions  . Statins Nausea And Vomiting    Chief Complaint  Patient presents with  . Acute Visit    Nausea / Vomiting    HPI:  Staff reports that he has had nausea for the past 2 days. They have given him phenergan without results. There no reports of diarrhea; fever; and has a poor tolerance to food or fluids. He is unable to participate in the hpi or ros due to his deteriorating mental status. 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 The nursing staff would like different medications to help with his nausea.       Past Medical History:  Diagnosis Date  . Abdominal pain, generalized   . Acquired polycythemia 01/05/2011  . Allergy   . Diarrhea   . Dizziness and giddiness   . Hernia of unspecified site of abdominal cavity without mention of obstruction or gangrene   . Hypertension   . Hypopotassemia   . Iron excess 01/05/2011  . 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 Memory loss   . Other specified cardiac dysrhythmias(427.89)   . 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 Polycythemia, secondary   . Systemic hypertension   . Tobacco use disorder   . Unspecified disorder of kidney and ureter   . Unspecified disorder of skin and subcutaneous tissue   . Unspecified hearing loss     Past Surgical History:  Procedure Laterality Date  . CHOLECYSTECTOMY    . COLONOSCOPY N/A 05/10/2016   Procedure: COLONOSCOPY;  Surgeon: Manus Gunning, MD;  Location: [facility];  Service: Gastroenterology;  Laterality: N/A;  . ESOPHAGOGASTRODUODENOSCOPY N/A 05/10/2016   Procedure: ESOPHAGOGASTRODUODENOSCOPY (EGD);  Surgeon: Manus Gunning, MD;  Location: [facility];  Service: Gastroenterology;  Laterality: N/A;  . GIVENS CAPSULE STUDY N/A 05/10/2016   Procedure: GIVENS CAPSULE STUDY;  Surgeon: Manus Gunning, MD;  Location: [facility];  Service:  Gastroenterology;  Laterality: N/A;  . INFLAMED COLON    . IR THORACENTESIS ASP PLEURAL SPACE W/IMG GUIDE  09/09/2016  . LEFT HEART CATH AND CORONARY ANGIOGRAPHY N/A 05/06/2016   Procedure: Left Heart Cath and Coronary Angiography;  Surgeon: Peter M Martinique, MD;  Location: [facility];  Service: Cardiovascular;  Laterality: N/A;  . NM MYOCAR PERF WALL MOTION  8//30/11   normal  . US ECHOCARDIOGRAPHY  11/03/09   EF 50-55%,  . VENTRAL HERNIA REPAIR      Social History   Social History  . Marital status: Single    Spouse name: N/A  . Number of children: N/A  . Years of education: N/A   Occupational History  . Not on file.   Social History Main Topics  . Smoking status: Former Smoker    Packs/day: 0.25    Years: 55.00    Types: Pipe    Quit date: 05/01/2016  . Smokeless tobacco: Never Used  . Alcohol use No  . Drug use: No  . Sexual activity: Not Currently   Other Topics Concern  . Not on file   Social History Narrative   Friend [name] is contact and helps with his finances, appts, etc.   Family History  Problem Relation Age of Onset  . Colon polyps Mother 55  . Colon cancer Mother   . Heart disease Father   . Diabetes Sister   . Cancer Brother   .  Diabetes Sister   . Diabetes Sister   . Heart disease Sister   . Stomach cancer Neg Hx   . Rectal cancer Neg Hx       VITAL SIGNS BP 132/74   Pulse 70   Temp 97.9 F (36.6 C)   Resp 18   Ht 5' 9"  (1.753 m)   Wt 197 lb 12.8 oz (89.7 kg)   SpO2 97%   BMI 29.21 kg/m   Patient's Medications  New Prescriptions   No medications on file  Previous Medications   APIXABAN (ELIQUIS) 5 MG TABS TABLET    Take 1 tablet (5 mg total) by mouth 2 (two) times daily.   CLONIDINE (CATAPRES) 0.1 MG TABLET    Take 0.1 mg by mouth 2 (two) times daily.   DOXYCYCLINE (DORYX) 100 MG EC TABLET    Take 100 mg by mouth 2 (two) times daily.   EZETIMIBE (ZETIA) 10 MG TABLET    Take 1 tablet (10 mg total) by mouth daily.   FUROSEMIDE  (LASIX) 40 MG TABLET    TAKE 1 TABLET IN THE MORNING AND TAKE 1 TABLET IN THE EVENING   GUAIFENESIN (MUCINEX) 600 MG 12 HR TABLET    Take 1 tablet (600 mg total) by mouth 2 (two) times daily as needed for cough or to loosen phlegm.   HYDROCODONE-ACETAMINOPHEN (NORCO) 5-325 MG TABLET    Take 1 tablet by mouth every 6 (six) hours as needed for moderate pain.   IPRATROPIUM-ALBUTEROL (DUONEB) 0.5-2.5 (3) MG/3ML SOLN    Take 3 mLs by nebulization 3 (three) times daily. And every 4 hours as needed   IRON POLYSACCHARIDES (NIFEREX) 150 MG CAPSULE    Take 1 capsule (150 mg total) by mouth daily.   LACTOBACILLUS (ACIDOPHILUS) CAPS CAPSULE    Take 1 capsule by mouth 2 (two) times daily. For 3 weeks   OXYGEN    Inhale 4 L/min into the lungs continuous.   PANTOPRAZOLE (PROTONIX) 40 MG TABLET    Take 1 tablet (40 mg total) by mouth daily at 6 (six) AM.   POTASSIUM CHLORIDE SA (K-DUR,KLOR-CON) 20 MEQ TABLET    Take 1 tablet (20 mEq total) by mouth daily.   PROMETHAZINE (PHENERGAN) 25 MG/ML INJECTION    Inject 25 mg into the muscle every 6 (six) hours as needed for nausea or vomiting.   SPIRONOLACTONE (ALDACTONE) 25 MG TABLET    Take 25 mg by mouth 2 (two) times daily.   TAMSULOSIN (FLOMAX) 0.4 MG CAPS CAPSULE    Take 1 capsule (0.4 mg total) by mouth daily.  Modified Medications   No medications on file  Discontinued Medications   No medications on file     SIGNIFICANT DIAGNOSTIC EXAMS  PREVIOUS   09-04-16: chest x-ray: New parenchymal consolidation in the anterior right upper lobe, suspicious for pneumonia. Followup PA and lateral chest X-ray is recommended in 3-4 weeks following trial of antibiotic therapy to ensure resolution and exclude underlying malignancy.  09-06-16: ct of chest: 1. 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 2. Progressive masslike opacity throughout the right upper lobe with direct extension to the right hilum on right side of the  mediastinum. 3. New or progressive thoracic nodal metastasis. 4. Development of extensive hepatic and upper abdominal nodal metastasis. 5. Mild motion degradation. 6. Other areas of right upper lobe ground-glass and pulmonary opacity could represent postobstructive pneumonitis and/or lymphangitic tumor. 7. Increase in small right and similar trace left pleural effusion.  8. Incompletely imaged collateral vein about the right-side of the abdomen is nonspecific. This could be related to chronic IVC thrombus or even an incompletely imaged right renal mass. 9. Pulmonary artery enlargement suggests pulmonary arterial hypertension. 10. Coronary artery atherosclerosis. Aortic Atherosclerosis  09-09-16: thoracentesis: A total of approximately 1.1 L of serous fluid was removed  09-09-16: chest x-ray: . No pneumothorax post right thoracentesis.   09-10-16: mri of brain: 1. 6 mm subacute infarct versus solitary metastasis in the right putamen. MRI in 6 weeks should be able to differentiate. 2. Moderately motion degraded study. 3. Heterogeneous marrow in the clivus, attention on follow-up.  09-27-16: venous doppler left lower extremity: no dvt  NO NEW EXAMS  LABS REVIEWED PREVIOUS    09-04-16: wbc 79; hgb 11.5; hct 37.5; mcv 81.0; plt 263; glucose 117; bun 13; creat 0.96; k+ 3.3; na++ 133; ca 8.9 blood culture: no growth: urine culture: <10,000 colonies 09-05-16: wb 7.9; hgb 10.4; hct 34.6; mcv 81.8; plt 259; glucose 139; bun 12; creat 0.92; k+ 3.2 ;na++ 134; ca 8.1; ast 134; alk phos 228; albumin 2.7 09-06-16: CEA 91.8; 09-08-16: BNP 290.2 09-12-16: glucose 97;bun 16; creat 0.81; k+ 3.8; na++ 136; ca 8.1 09-22-16: wbc 8.5; hgb 11.9; hct 37.9; mcv 82.5; plt 183; glucose 81; bun 19.0; creat 0.87; k+ 4.4; na++ 136; ca 9.2; alt 71; ast 312; alk phos 675; total bili: 1.3; albumin 3.2  NO NEW LABS      Review of Systems  Unable to perform ROS: Other (is confused; not able to answer questions )   Physical Exam    Constitutional: No distress.  Eyes: Conjunctivae are normal.  Neck: Neck supple. No JVD present. No thyromegaly present.  Cardiovascular: Normal rate, regular rhythm and intact distal pulses.   Respiratory: Effort normal. No respiratory distress. He has no wheezes.  Breath sounds diminished is 02 dependent   GI: Soft. Bowel sounds are normal. He exhibits no distension. There is no tenderness.  Musculoskeletal: He exhibits edema.  Able to move all extremities  Trace lower extremity edema   Lymphadenopathy:    He has no cervical adenopathy.  Neurological: He is alert.  Skin: Skin is warm and dry. He is not diaphoretic.  Stage II areas on buttocks slight improvement   Psychiatric: He has a normal mood and affect.   ASSESSMENT/ PLAN:  1. Nausea with vomiting: will stop the phenergan at this time as this medication is ineffective; will begin zofran 4 mg every 6 hours as needed and will get a kub to look for ileus     MD is aware of resident's narcotic use and is in agreement with current plan of care. We will attempt to wean resident as apropriate     Ok Edwards NP [facility]  Contact [phone] Monday through Friday 8am- 5pm  After hours call [phone]

## 2016-10-11 ENCOUNTER — Ambulatory Visit (HOSPITAL_COMMUNITY)
Admission: RE | Admit: 2016-10-11 | Discharge: 2016-10-11 | Disposition: A | Discharge status: Home / Self Care | Payer: Medicare Other | Source: Ambulatory Visit | Attending: Pulmonary Disease | Admitting: Pulmonary Disease

## 2016-10-11 DIAGNOSIS — C772 Secondary and unspecified malignant neoplasm of intra-abdominal lymph nodes: Secondary | ICD-10-CM | POA: Insufficient documentation

## 2016-10-11 DIAGNOSIS — I700 Atherosclerosis of aorta: Secondary | ICD-10-CM | POA: Insufficient documentation

## 2016-10-11 DIAGNOSIS — R918 Other nonspecific abnormal finding of lung field: Secondary | ICD-10-CM | POA: Insufficient documentation

## 2016-10-11 DIAGNOSIS — C787 Secondary malignant neoplasm of liver and intrahepatic bile duct: Secondary | ICD-10-CM | POA: Diagnosis not present

## 2016-10-11 DIAGNOSIS — C7951 Secondary malignant neoplasm of bone: Secondary | ICD-10-CM | POA: Diagnosis not present

## 2016-10-11 DIAGNOSIS — J90 Pleural effusion, not elsewhere classified: Secondary | ICD-10-CM | POA: Insufficient documentation

## 2016-10-11 LAB — GLUCOSE, CAPILLARY: Glucose-Capillary: 81 mg/dL (ref 65–99)

## 2016-10-11 MED ORDER — FLUDEOXYGLUCOSE F - 18 (FDG) INJECTION
9.8500 | Freq: Once | INTRAVENOUS | Status: AC | PRN
  Administered 2016-10-11: 9.85 via INTRAVENOUS

## 2016-10-12 ENCOUNTER — Encounter: Payer: Self-pay | Admitting: *Deleted

## 2016-10-12 ENCOUNTER — Non-Acute Institutional Stay (SKILLED_NURSING_FACILITY): Payer: Medicare Other | Admitting: Adult Health

## 2016-10-12 DIAGNOSIS — C7801 Secondary malignant neoplasm of right lung: Secondary | ICD-10-CM

## 2016-10-12 DIAGNOSIS — Z7189 Other specified counseling: Secondary | ICD-10-CM

## 2016-10-13 ENCOUNTER — Encounter: Payer: Self-pay | Admitting: Adult Health

## 2016-10-13 NOTE — Progress Notes (Signed)
Location:   [facility] Room Number: 101 A Place of Service:  SNF (31)   CODE STATUS: DNR (Pt changed from Full Code to DNR on 10/12/16)  Allergies  Allergen Reactions  . Statins Nausea And Vomiting    Chief Complaint  Patient presents with  . Acute Visit    Change in Status    HPI:  He has had a significant change in his status. He is less responsive is mumbling; he is jaundiced; has lost a significant amount of weight. He no longer has any lower extremity edema. I have asked to speak with his family and palliative care NP. His sister an nephew are present. We have had a prolonged discussion regarding his status and prognosis. More than likely we are looking at hours to a few days. His family has decided to make him comfort care only. They are requesting hospice care.     Past Medical History:  Diagnosis Date  . Abdominal pain, generalized   . Acquired polycythemia 01/05/2011  . Allergy   . Diarrhea   . Dizziness and giddiness   . Hernia of unspecified site of abdominal cavity without mention of obstruction or gangrene   . Hypertension   . Hypopotassemia   . Iron excess 01/05/2011  . 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 Memory loss   . Other specified cardiac dysrhythmias(427.89)   . 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 Polycythemia, secondary   . Systemic hypertension   . Tobacco use disorder   . Unspecified disorder of kidney and ureter   . Unspecified disorder of skin and subcutaneous tissue   . Unspecified hearing loss     Past Surgical History:  Procedure Laterality Date  . CHOLECYSTECTOMY    . COLONOSCOPY N/A 05/10/2016   Procedure: COLONOSCOPY;  Surgeon: Manus Gunning, MD;  Location: [facility];  Service: Gastroenterology;  Laterality: N/A;  . ESOPHAGOGASTRODUODENOSCOPY N/A 05/10/2016   Procedure: ESOPHAGOGASTRODUODENOSCOPY (EGD);  Surgeon: Manus Gunning, MD;  Location: [facility];  Service: Gastroenterology;  Laterality: N/A;  . GIVENS CAPSULE STUDY  N/A 05/10/2016   Procedure: GIVENS CAPSULE STUDY;  Surgeon: Manus Gunning, MD;  Location: [facility];  Service: Gastroenterology;  Laterality: N/A;  . INFLAMED COLON    . IR THORACENTESIS ASP PLEURAL SPACE W/IMG GUIDE  09/09/2016  . LEFT HEART CATH AND CORONARY ANGIOGRAPHY N/A 05/06/2016   Procedure: Left Heart Cath and Coronary Angiography;  Surgeon: Peter M Martinique, MD;  Location: [facility];  Service: Cardiovascular;  Laterality: N/A;  . NM MYOCAR PERF WALL MOTION  8//30/11   normal  . US ECHOCARDIOGRAPHY  11/03/09   EF 50-55%,  . VENTRAL HERNIA REPAIR      Social History   Social History  . Marital status: Single    Spouse name: N/A  . Number of children: N/A  . Years of education: N/A   Occupational History  . Not on file.   Social History Main Topics  . Smoking status: Former Smoker    Packs/day: 0.25    Years: 55.00    Types: Pipe    Quit date: 05/01/2016  . Smokeless tobacco: Never Used  . Alcohol use No  . Drug use: No  . Sexual activity: Not Currently   Other Topics Concern  . Not on file   Social History Narrative   Friend [name] is contact and helps with his finances, appts, etc.   Family History  Problem Relation Age of Onset  . Colon polyps Mother 15  .  Colon cancer Mother   . Heart disease Father   . Diabetes Sister   . Cancer Brother   . Diabetes Sister   . Diabetes Sister   . Heart disease Sister   . Stomach cancer Neg Hx   . Rectal cancer Neg Hx       VITAL SIGNS BP 110/66   Pulse 90   Temp 97.7 F (36.5 C)   Resp 18   Ht 5' 9"  (1.753 m)   Wt 197 lb 12.8 oz (89.7 kg)   SpO2 95%   BMI 29.21 kg/m    Patient's Medications  New Prescriptions   No medications on file  Previous Medications   APIXABAN (ELIQUIS) 5 MG TABS TABLET    Take 1 tablet (5 mg total) by mouth 2 (two) times daily.   CLONIDINE (CATAPRES) 0.1 MG TABLET    Take 0.1 mg by mouth 2 (two) times daily.   EZETIMIBE (ZETIA) 10 MG TABLET    Take 1 tablet (10 mg  total) by mouth daily.   FUROSEMIDE (LASIX) 40 MG TABLET    TAKE 1 TABLET IN THE MORNING AND TAKE 1 TABLET IN THE EVENING   GUAIFENESIN (MUCINEX) 600 MG 12 HR TABLET    Take 1 tablet (600 mg total) by mouth 2 (two) times daily as needed for cough or to loosen phlegm.   HYDROCODONE-ACETAMINOPHEN (NORCO) 5-325 MG TABLET    Take 1 tablet by mouth every 6 (six) hours as needed for moderate pain.   IPRATROPIUM-ALBUTEROL (DUONEB) 0.5-2.5 (3) MG/3ML SOLN    Take 3 mLs by nebulization 3 (three) times daily. And every 4 hours as needed   IRON POLYSACCHARIDES (NIFEREX) 150 MG CAPSULE    Take 1 capsule (150 mg total) by mouth daily.   LACTOBACILLUS (ACIDOPHILUS) CAPS CAPSULE    Take 1 capsule by mouth 2 (two) times daily. For 3 weeks   ONDANSETRON (ZOFRAN) 4 MG TABLET    Take 4 mg by mouth every 6 (six) hours as needed for nausea or vomiting.   OXYGEN    Inhale 4 L/min into the lungs continuous.   PANTOPRAZOLE (PROTONIX) 40 MG TABLET    Take 1 tablet (40 mg total) by mouth daily at 6 (six) AM.   POTASSIUM CHLORIDE SA (K-DUR,KLOR-CON) 20 MEQ TABLET    Take 1 tablet (20 mEq total) by mouth daily.   SPIRONOLACTONE (ALDACTONE) 25 MG TABLET    Take 25 mg by mouth 2 (two) times daily.   TAMSULOSIN (FLOMAX) 0.4 MG CAPS CAPSULE    Take 1 capsule (0.4 mg total) by mouth daily.  Modified Medications   No medications on file  Discontinued Medications   PROMETHAZINE (PHENERGAN) 25 MG/ML INJECTION    Inject 25 mg into the muscle every 6 (six) hours as needed for nausea or vomiting.     SIGNIFICANT DIAGNOSTIC EXAMS  PREVIOUS   09-04-16: chest x-ray: New parenchymal consolidation in the anterior right upper lobe, suspicious for pneumonia. Followup PA and lateral chest X-ray is recommended in 3-4 weeks following trial of antibiotic therapy to ensure resolution and exclude underlying malignancy.  09-06-16: ct of chest: 1. 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 2.  Progressive masslike opacity throughout the right upper lobe with direct extension to the right hilum on right side of the mediastinum. 3. New or progressive thoracic nodal metastasis. 4. Development of extensive hepatic and upper abdominal nodal metastasis. 5. Mild motion degradation. 6. Other areas of right upper lobe  ground-glass and pulmonary opacity could represent postobstructive pneumonitis and/or lymphangitic tumor. 7. Increase in small right and similar trace left pleural effusion. 8. Incompletely imaged collateral vein about the right-side of the abdomen is nonspecific. This could be related to chronic IVC thrombus or even an incompletely imaged right renal mass. 9. Pulmonary artery enlargement suggests pulmonary arterial hypertension. 10. Coronary artery atherosclerosis. Aortic Atherosclerosis  09-09-16: thoracentesis: A total of approximately 1.1 L of serous fluid was removed  09-09-16: chest x-ray: . No pneumothorax post right thoracentesis.   09-10-16: mri of brain: 1. 6 mm subacute infarct versus solitary metastasis in the right putamen. MRI in 6 weeks should be able to differentiate. 2. Moderately motion degraded study. 3. Heterogeneous marrow in the clivus, attention on follow-up.  09-27-16: venous doppler left lower extremity: no dvt  10-11-16: Pet scan: .  1. findings are compatible with stage IV primary bronchogenic carcinoma. 2. Large hypermetabolic 19.3 cm right upper lobe lung mass compatible with primary bronchogenic carcinoma. 3. Hypermetabolic bilateral level 2 neck, ipsilateral hilar, right paratracheal, subcarinal, bilateral pericardiophrenic and upper retroperitoneal nodal metastases. 4. Confluent hypermetabolic metastatic disease replacing much of the liver. 5. Widespread confluent hypermetabolic osseous metastatic disease throughout the axial and proximal appendicular skeleton including involvement of the bilateral femoral necks, predisposing to pathologic  fractures. 6. Heterogeneous FDG uptake in the cerebellum, recommend correlation with brain MRI to exclude brain metastases. 7. Small to moderate right and small left dependent pleural effusions. Trace pelvic ascites. 8. Aortic Atherosclerosis (ICD10-I70.0). Ectatic 4.2 cm ascending thoracic aorta.     LABS REVIEWED PREVIOUS    09-04-16: wbc 79; hgb 11.5; hct 37.5; mcv 81.0; plt 263; glucose 117; bun 13; creat 0.96; k+ 3.3; na++ 133; ca 8.9 blood culture: no growth: urine culture: <10,000 colonies 09-05-16: wb 7.9; hgb 10.4; hct 34.6; mcv 81.8; plt 259; glucose 139; bun 12; creat 0.92; k+ 3.2 ;na++ 134; ca 8.1; ast 134; alk phos 228; albumin 2.7 09-06-16: CEA 91.8; 09-08-16: BNP 290.2 09-12-16: glucose 97;bun 16; creat 0.81; k+ 3.8; na++ 136; ca 8.1 09-22-16: wbc 8.5; hgb 11.9; hct 37.9; mcv 82.5; plt 183; glucose 81; bun 19.0; creat 0.87; k+ 4.4; na++ 136; ca 9.2; alt 71; ast 312; alk phos 675; total bili: 1.3; albumin 3.2  NO NEW LABS     Review of Systems  Unable to perform ROS: Medical condition (unable to answer questions )   Physical Exam  Constitutional: No distress.  Eyes: Conjunctivae are normal.  Neck: Neck supple. No JVD present. No thyromegaly present.  Cardiovascular: Normal rate, regular rhythm and intact distal pulses.   Respiratory: Effort normal. No respiratory distress. He has no wheezes.  02 dependent Breath sounds diminished   GI: Soft. Bowel sounds are normal. He exhibits no distension. There is no tenderness.  Musculoskeletal: He exhibits no edema.  Is not moving any extremities at this time.   Lymphadenopathy:    He has no cervical adenopathy.  Neurological:  Not aware  Skin: Skin is warm and dry. He is not diaphoretic.  Is jaundiced   ASSESSMENT/ PLAN:  1. 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 Will set up a hospice consult. He  now DNR with comfort care only  Time spent with family meeting: 45 minutes: to include education on his metastatic cancer: type of care he requires for comfort; his terminal prognosis; and end of  life issues including hospice care. His family has verbalized understanding of the information given.     MD is aware of resident's narcotic use and is in agreement with current plan of care. We will attempt to wean resident as apropriate   Ok Edwards NP [facility] Adult Medicine  Contact [phone] Monday through Friday 8am- 5pm  After hours call [phone]

## 2016-10-24 ENCOUNTER — Inpatient Hospital Stay: Payer: Self-pay | Admitting: Internal Medicine

## 2016-10-27 ENCOUNTER — Other Ambulatory Visit: Payer: Self-pay

## 2016-10-27 DIAGNOSIS — C7801 Secondary malignant neoplasm of right lung: Secondary | ICD-10-CM | POA: Insufficient documentation

## 2016-10-27 DIAGNOSIS — Z7189 Other specified counseling: Secondary | ICD-10-CM | POA: Insufficient documentation

## 2016-10-31 ENCOUNTER — Ambulatory Visit: Payer: Medicare Other | Admitting: Internal Medicine

## 2016-11-01 ENCOUNTER — Ambulatory Visit: Payer: Self-pay | Admitting: Pulmonary Disease

## 2016-11-05 DEATH — deceased

## 2019-04-26 IMAGING — CR DG CHEST 2V
2 series · 2 of 2 positions shown · non-contrast
Comparison: September 04, 2016 chest radiograph and chest CT May 04, 2016

CLINICAL DATA: Pneumonia with shortness of breath

EXAM:
CHEST  2 VIEW

[chest pa]
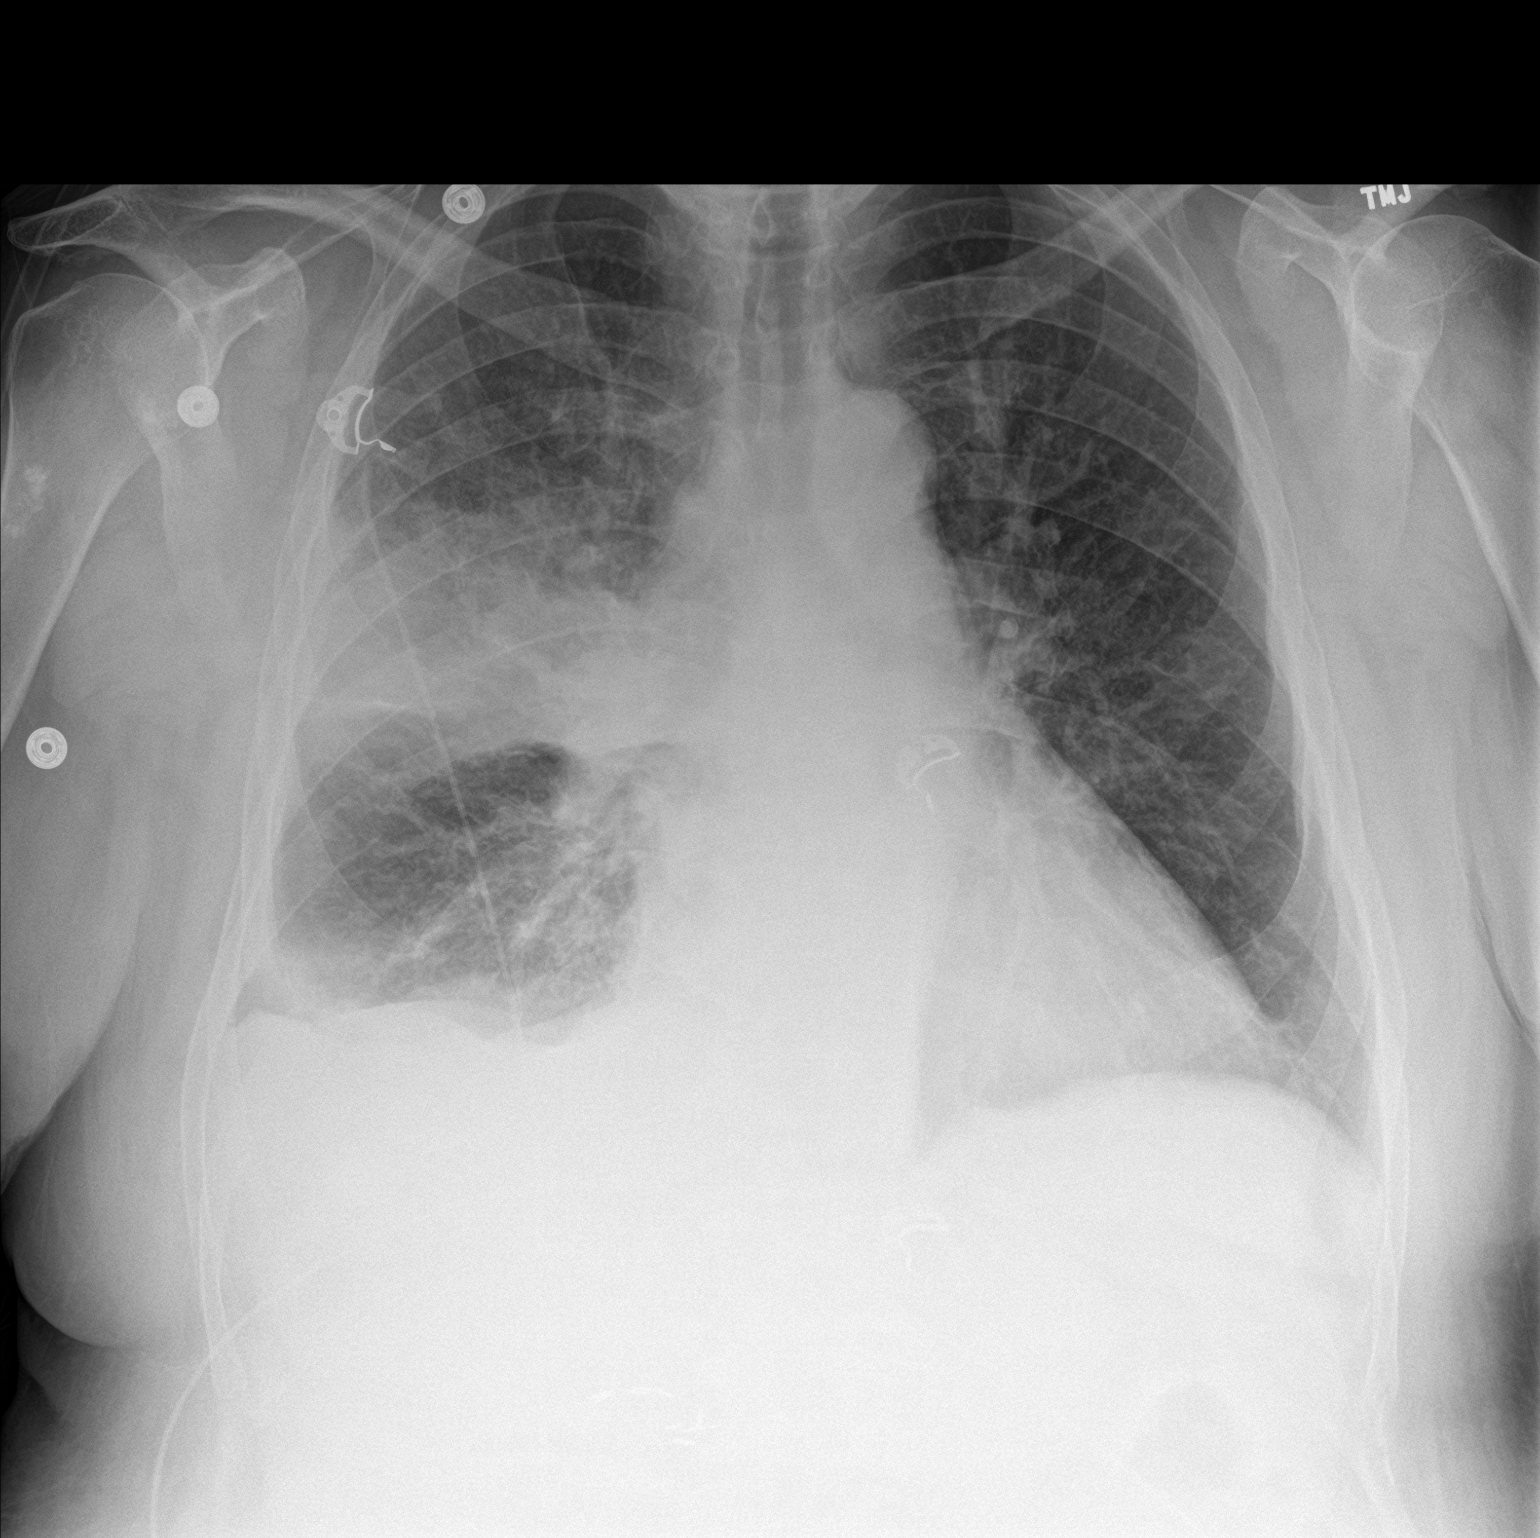

[chest lat]
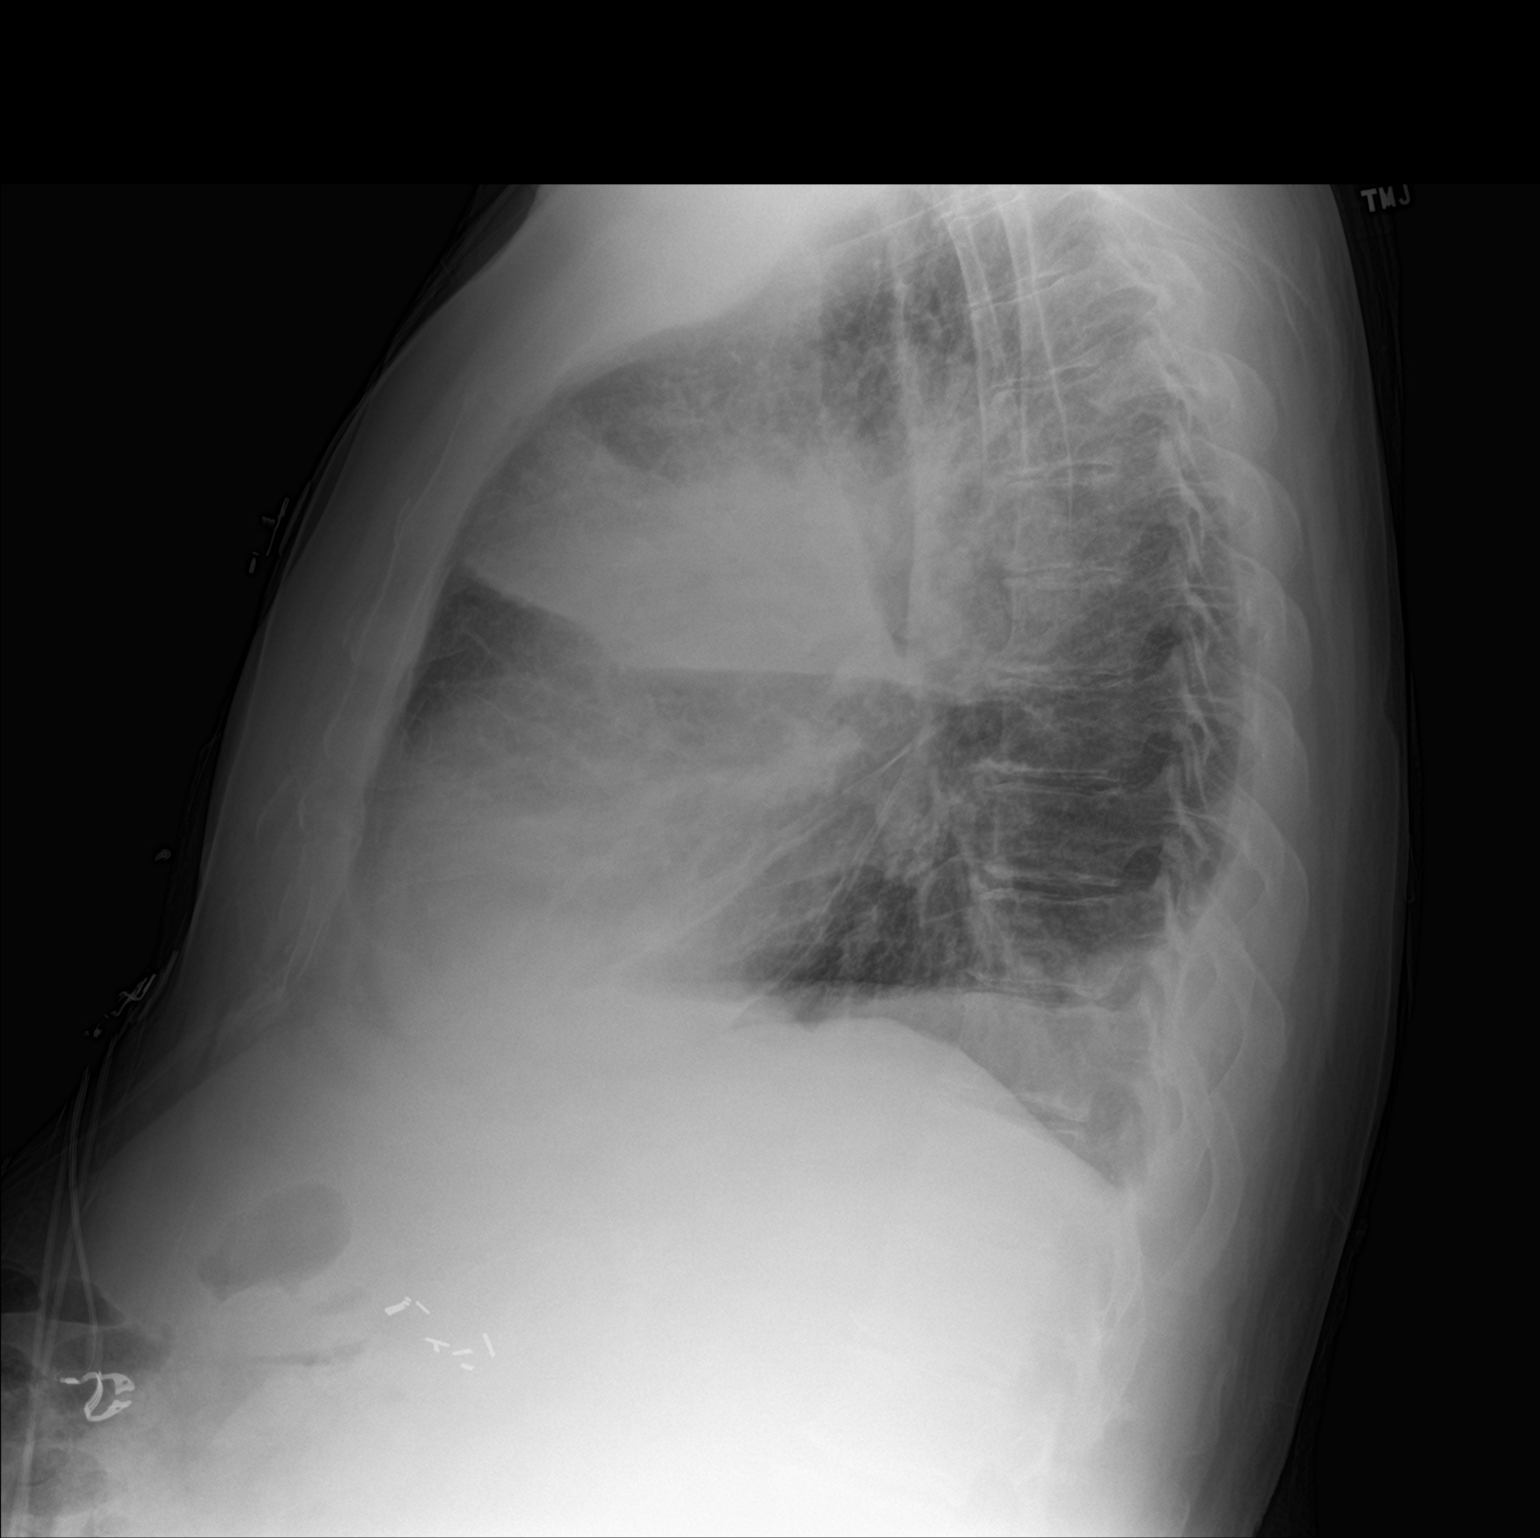

[2 of 2 positions shown; findings below may reference images not displayed]

FINDINGS: There is persistent airspace consolidation in the anterior segment
right upper lobe with without appreciable change from 1 day prior.
There is new focal airspace opacity in the lateral right base with
minimal right pleural effusion. Left lung is clear. Heart is upper
normal in size with mild pulmonary venous hypertension. No
adenopathy evident. No bone lesions.
IMPRESSION: Extensive airspace consolidation anterior segment right upper lobe,
stable from 1 day prior and consistent with pneumonia. New focal
presumed pneumonia lateral right base. New small right pleural
effusion. Left lung unchanged. Stable cardiac silhouette with a
degree of pulmonary vascular congestion.

Followup PA and lateral chest radiographs recommended in 3-4 weeks
following trial of antibiotic therapy to ensure resolution and
exclude underlying malignancy.

## 2019-04-28 IMAGING — DX DG CHEST 1V PORT
1 series · 1 of 1 positions shown · non-contrast
Comparison: Chest CT 09/06/2016

Chest radiograph 09/05/2016

CLINICAL DATA: Hypoxia

EXAM:
PORTABLE CHEST 1 VIEW

[chest ap]
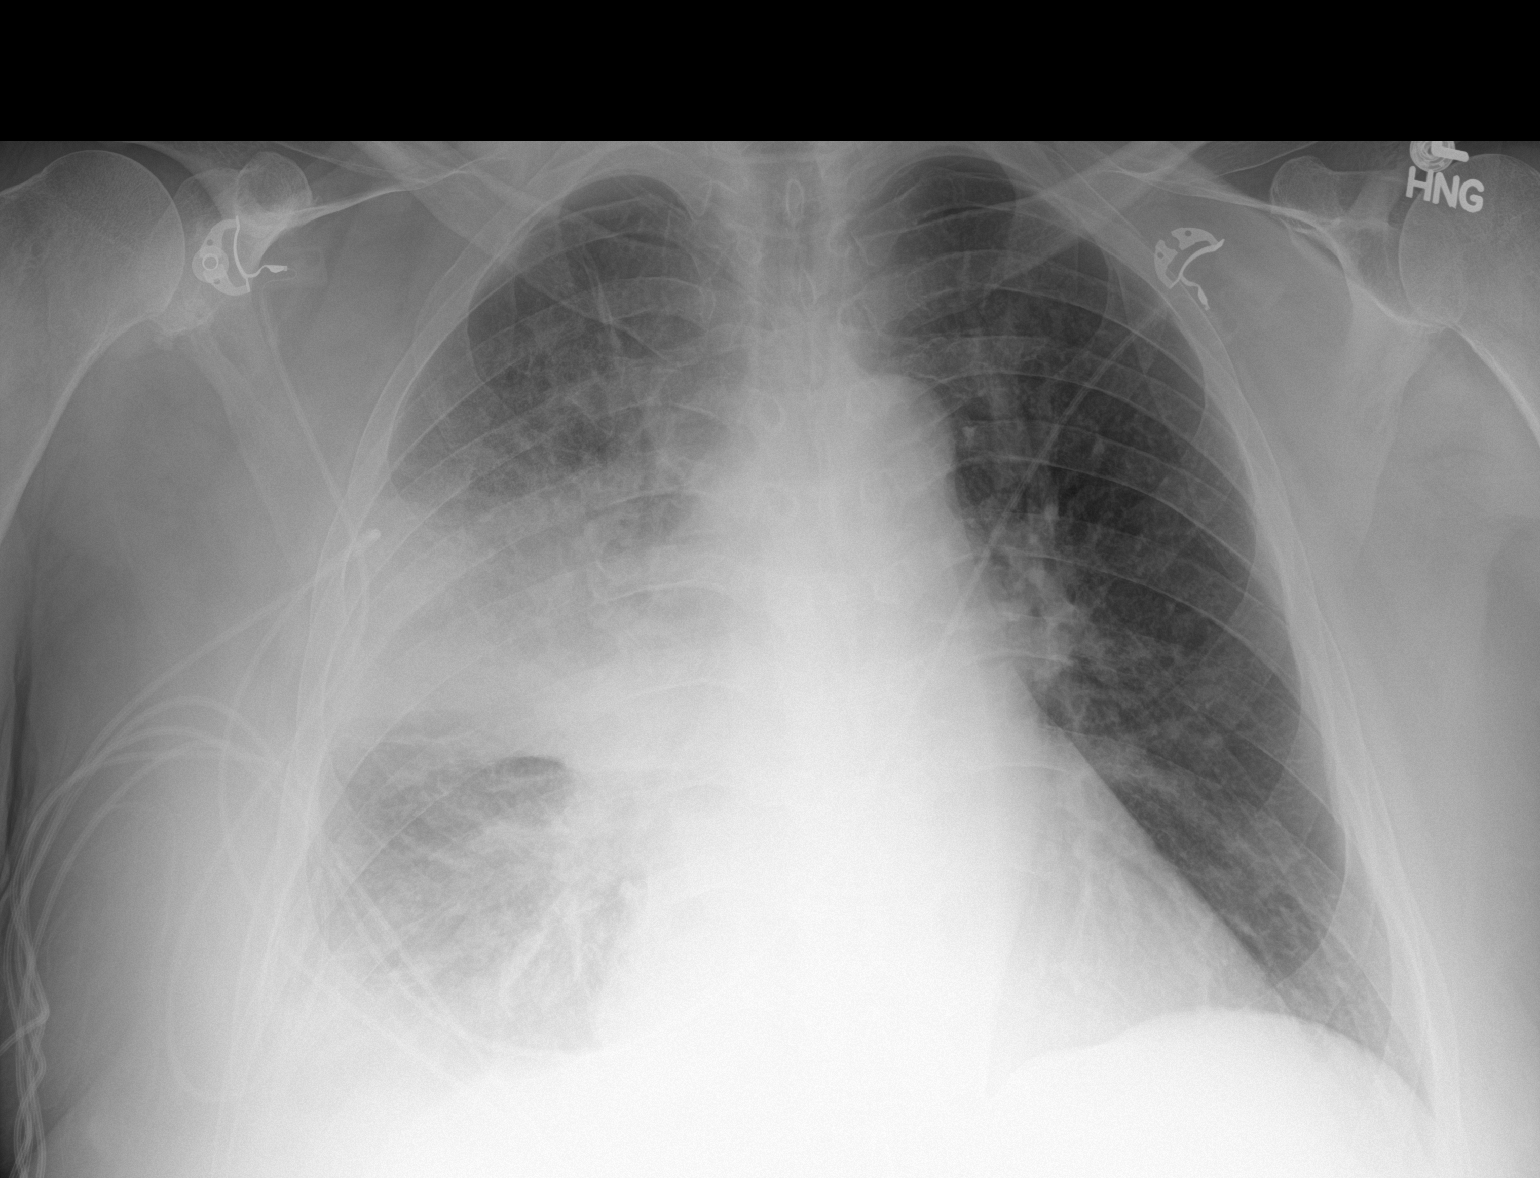

[1 of 1 positions shown; findings below may reference images not displayed]

FINDINGS: There is continued consolidation of the lower portion of the right
upper lobe with prominence of the right hilum consistent with hilar
mass. There is a small right pleural effusion. Mild airspace
opacities in the right lower lobe. Left lung is clear. No
pneumothorax.
IMPRESSION: Partial right upper lobe consolidation, likely postobstructive in
the setting of right hilar mass. The findings have slightly worsened
from the prior chest radiograph of 09/05/2016. Small right pleural
effusion.

## 2019-04-30 IMAGING — US IR THORACENTESIS ASP PLEURAL SPACE W/IMG GUIDE
1 series · 2 of 2 positions shown · non-contrast
Comparison: none

INDICATION: Right lung mass with right pleural effusion. Request is made for
diagnostic and therapeutic thoracentesis.

[Series 1: ir (id) (id)/(id)/(id) ir · 2 of 2 slices shown]
[im 1/2]
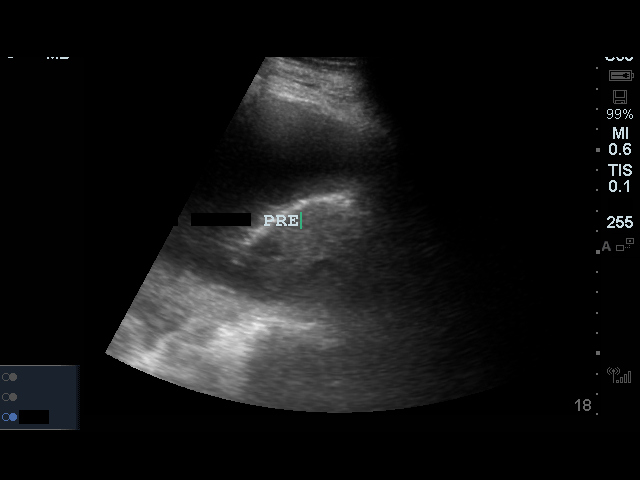
[im 2/2]
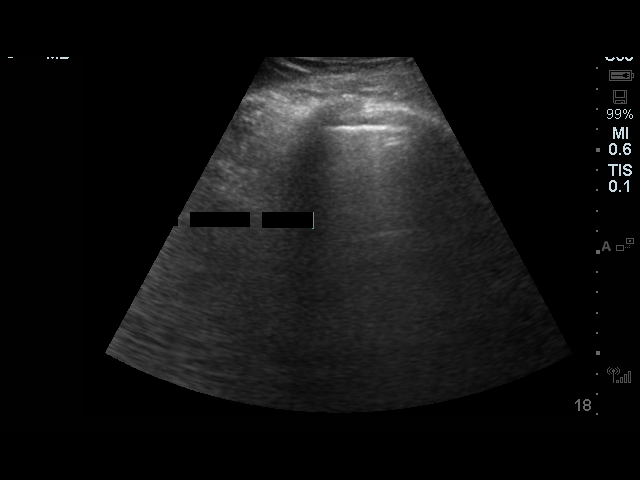

[2 of 2 positions shown; findings below may reference images not displayed]

EXAM:
ULTRASOUND GUIDED DIAGNOSTIC AND THERAPEUTIC THORACENTESIS

MEDICATIONS:
1% lidocaine

COMPLICATIONS:
None immediate.

PROCEDURE:
An ultrasound guided thoracentesis was thoroughly discussed with the
patient and questions answered. The benefits, risks, alternatives
and complications were also discussed. The patient understands and
wishes to proceed with the procedure. Written consent was obtained.

Ultrasound was performed to localize and mark an adequate pocket of
fluid in the right chest. The area was then prepped and draped in
the normal sterile fashion. 1% Lidocaine was used for local
anesthesia. Under ultrasound guidance a Safe-T-Centesis catheter was
introduced. Thoracentesis was performed. The catheter was removed
and a dressing applied.
FINDINGS: A total of approximately 1.1 L of serous fluid was removed. Samples
were sent to the laboratory as requested by the clinical team.
IMPRESSION: Successful ultrasound guided right thoracentesis yielding 1.1 L of
pleural fluid.

## 2019-04-30 IMAGING — DX DG CHEST 1V
1 series · 1 of 1 positions shown · non-contrast
Comparison: 09/07/2016

CLINICAL DATA: S/p thoracentesis 1.1 L removed from right side

EXAM:
CHEST  1 VIEW

[x chest ap]
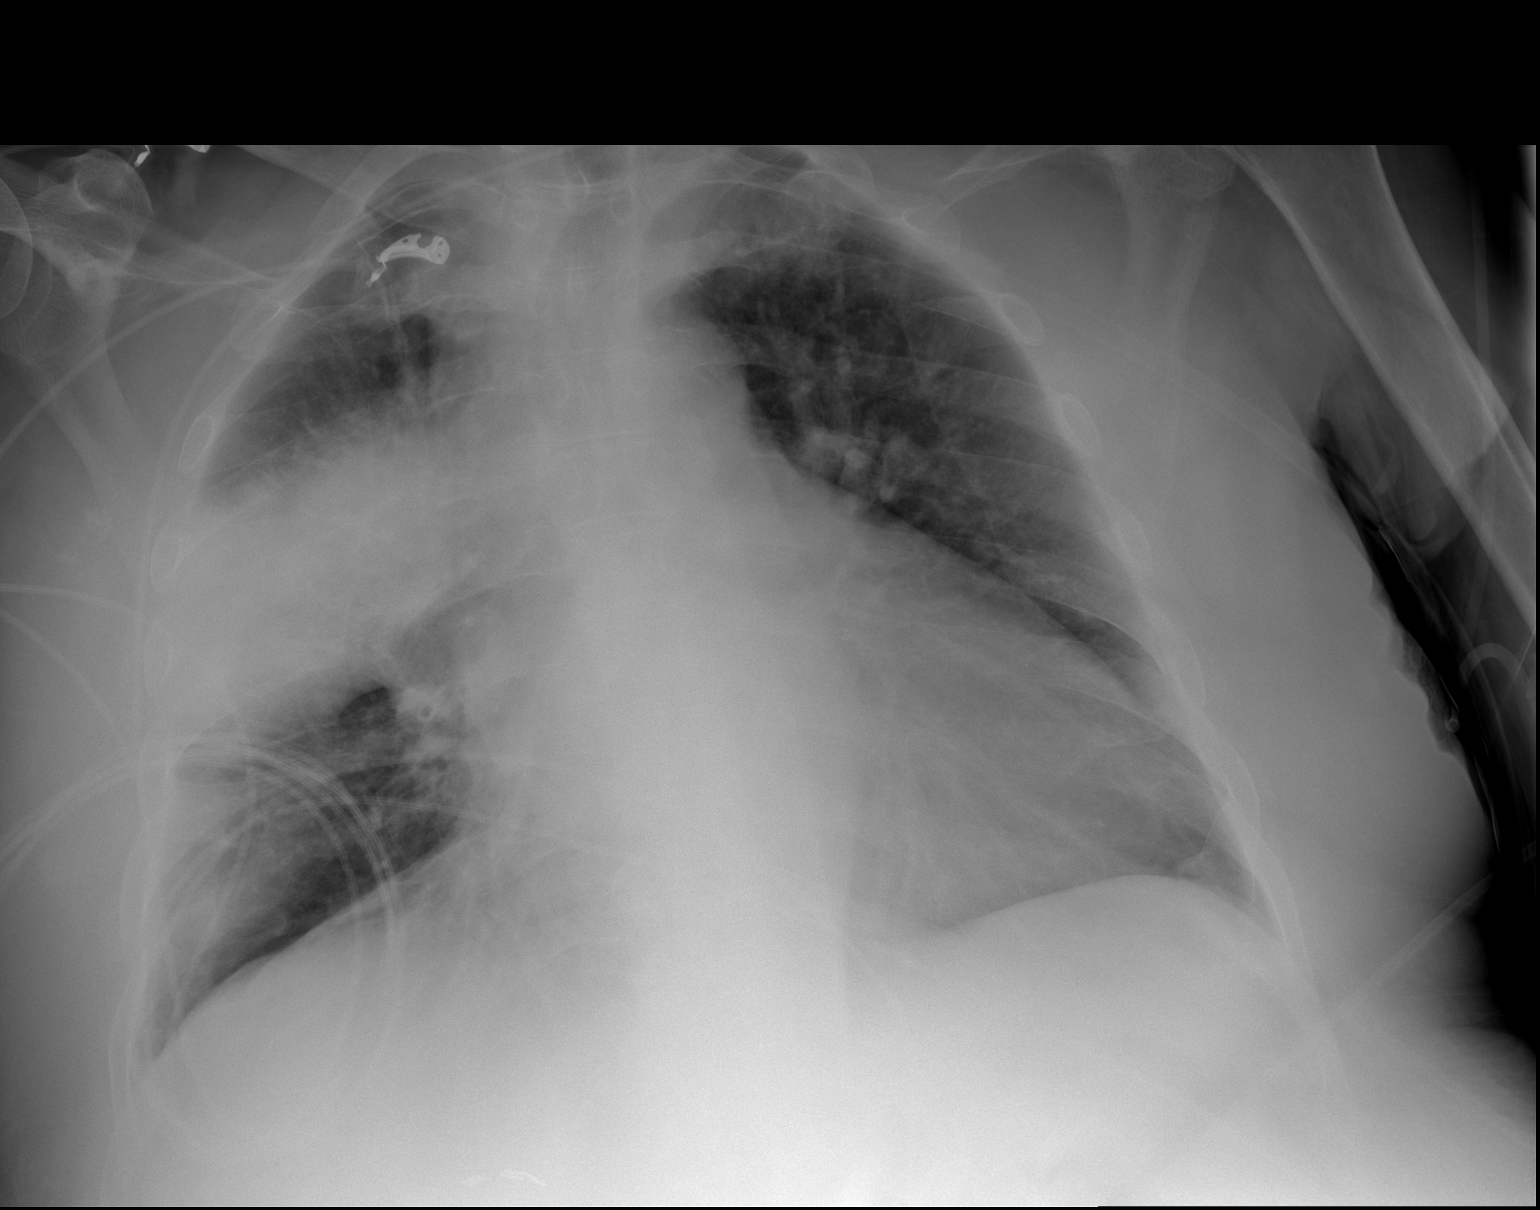

[1 of 1 positions shown; findings below may reference images not displayed]

FINDINGS: No pneumothorax. No definite residual effusion. Left lung clear.
Improved aeration of the right lung base. Right mid lung and hilar
mass as before.

Heart size upper limits normal for technique.

Visualized bones unremarkable.
IMPRESSION: 1. No pneumothorax post right thoracentesis.
